# Patient Record
Sex: Female | Born: 1955 | Race: White | Hispanic: No | Smoking: Current every day smoker
Health system: Southern US, Community
[De-identification: ages and names within clinical notes are randomized; demographics above are authoritative.]

## PROBLEM LIST (undated history)

## (undated) DIAGNOSIS — I509 Heart failure, unspecified: Secondary | ICD-10-CM

## (undated) DIAGNOSIS — I2510 Atherosclerotic heart disease of native coronary artery without angina pectoris: Secondary | ICD-10-CM

## (undated) DIAGNOSIS — T82867A Thrombosis of cardiac prosthetic devices, implants and grafts, initial encounter: Secondary | ICD-10-CM

## (undated) DIAGNOSIS — E785 Hyperlipidemia, unspecified: Secondary | ICD-10-CM

## (undated) DIAGNOSIS — I10 Essential (primary) hypertension: Secondary | ICD-10-CM

## (undated) DIAGNOSIS — E119 Type 2 diabetes mellitus without complications: Secondary | ICD-10-CM

## (undated) HISTORY — DX: Hyperlipidemia, unspecified: E78.5

## (undated) HISTORY — DX: Atherosclerotic heart disease of native coronary artery without angina pectoris: I25.10

## (undated) HISTORY — DX: Type 2 diabetes mellitus without complications: E11.9

## (undated) HISTORY — DX: Heart failure, unspecified: I50.9

## (undated) HISTORY — DX: Essential (primary) hypertension: I10

## (undated) HISTORY — PX: CARDIAC CATHETERIZATION: SHX172

---
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
## 2017-01-01 DIAGNOSIS — T82867A Thrombosis of cardiac prosthetic devices, implants and grafts, initial encounter: Secondary | ICD-10-CM

## 2017-01-01 HISTORY — DX: Thrombosis due to cardiac prosthetic devices, implants and grafts, initial encounter: T82.867A

## 2017-08-18 ENCOUNTER — Other Ambulatory Visit (HOSPITAL_COMMUNITY): Payer: Self-pay | Admitting: Family Medicine

## 2017-08-18 DIAGNOSIS — I509 Heart failure, unspecified: Secondary | ICD-10-CM

## 2017-08-24 ENCOUNTER — Encounter (HOSPITAL_COMMUNITY): Payer: Self-pay | Admitting: Radiology

## 2017-08-24 ENCOUNTER — Encounter (HOSPITAL_COMMUNITY)
Admission: RE | Admit: 2017-08-24 | Discharge: 2017-08-24 | Disposition: A | Discharge status: Home / Self Care | Payer: Medicaid Other | Source: Ambulatory Visit | Attending: Family Medicine | Admitting: Family Medicine

## 2017-08-24 DIAGNOSIS — I509 Heart failure, unspecified: Secondary | ICD-10-CM | POA: Diagnosis present

## 2017-08-24 LAB — NM MYOCAR MULTI W/SPECT W/WALL MOTION / EF
CHL CUP RESTING HR STRESS: 52 {beats}/min
CSEPEW: 1 METS
MPHR: 157 {beats}/min
Peak HR: 100 {beats}/min
Percent HR: 63 %

## 2017-08-24 MED ORDER — TECHNETIUM TC 99M TETROFOSMIN IV KIT
10.0000 | PACK | Freq: Once | INTRAVENOUS | Status: AC | PRN
  Administered 2017-08-24: 10 via INTRAVENOUS

## 2017-08-24 MED ORDER — NITROGLYCERIN 0.4 MG SL SUBL
0.4000 mg | SUBLINGUAL_TABLET | SUBLINGUAL | Status: DC | PRN
  Administered 2017-08-24: 0.4 mg via SUBLINGUAL

## 2017-08-24 MED ORDER — REGADENOSON 0.4 MG/5ML IV SOLN
INTRAVENOUS | Status: AC
  Administered 2017-08-24: 0.4 mg
  Filled 2017-08-24: qty 5

## 2017-08-24 MED ORDER — NITROGLYCERIN 0.4 MG SL SUBL
SUBLINGUAL_TABLET | SUBLINGUAL | Status: AC
  Administered 2017-08-24: 0.4 mg
  Filled 2017-08-24: qty 1

## 2017-08-24 MED ORDER — REGADENOSON 0.4 MG/5ML IV SOLN: 0.4000 mg | Freq: Once | INTRAVENOUS | Status: DC

## 2017-08-24 MED ORDER — TECHNETIUM TC 99M TETROFOSMIN IV KIT
30.0000 | PACK | Freq: Once | INTRAVENOUS | Status: AC | PRN
  Administered 2017-08-24: 30 via INTRAVENOUS

## 2017-08-24 MED ORDER — NITROGLYCERIN 0.4 MG SL SUBL
SUBLINGUAL_TABLET | SUBLINGUAL | Status: AC
  Filled 2017-08-24: qty 1

## 2017-08-24 NOTE — Progress Notes (Signed)
Approx 10 minutes after Stress test patient started experiencing jaw pain and headache. Patient placed on monitor and Nitro ordered per [place] ,[place]. ECG being captured. PA present at bedside.

## 2017-11-09 ENCOUNTER — Other Ambulatory Visit (HOSPITAL_COMMUNITY): Payer: Self-pay | Admitting: Neurological Surgery

## 2017-11-09 DIAGNOSIS — G959 Disease of spinal cord, unspecified: Secondary | ICD-10-CM

## 2017-11-23 ENCOUNTER — Encounter (HOSPITAL_COMMUNITY): Payer: Self-pay | Admitting: Physician Assistant

## 2017-11-23 NOTE — Progress Notes (Addendum)
Anesthesia Chart Review:  SAME DAY WORKUP   Case:  [id number] Date/Time:  11/24/17 0945   Procedure:  MRI CERVICAL SPINE WITHOUT CONTRAST (N/A )   Anesthesia type:  General   Pre-op diagnosis:  CERVICAL MYOLOPATHY   Location:  [facility] RADIOLOGY RO[facility] / [facility]   Surgeon:  Radiologist, Medication, MD      DISCUSSION: 62 yo female current smoker for above procedure. Pertinent hx includes HTN, DMII, CAD (reports history of 5 stents).  Pt has essentially no history to review in Epic. She did have a myocardial perfusion scan 08/24/2017 ordered by Dr. Lollie Marrow. Indication for the scan states "History of heart failure. History of MI in October 2018. Palpitations with catheterization and stent. CABG. Multiple risk factors." Scan showed the following:  1. 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 There is a small region of reversibility in the inferolateral wall towards the apex suggesting a small region of peri-infarct ischemia. There is also a small to moderate size region of mild reversibility in the septum towards the base.  2. There is incomplete thickening of the inferior and inferolateral walls correlating with the region of infarct. There is dyskinesis in the septum consistent with previous CABG.  3. Left ventricular ejection fraction 37%  4. Non invasive risk stratification*: High   Due to high risk myocardial perfusion, case discussed with Dr. Chilton Si. She stated the pt needs cardiac clearance prior to anesthesia. I have communicated this to Dr. Marcy Siren scheduler. Case will be cancelled pending clearance.  VS: There were no vitals taken for this visit.  PROVIDERS: Norval Gable, DO is PCP  Lollie Marrow, MD is Cardiologist  LABS: Will need DOS labs  Labs Reviewed - No data to display   IMAGES: N/A   EKG: 08/24/2017: Normal sinus rhythm. Left anterior fascicular block. Moderate voltage criteria for LVH, may be normal variant.  Nonspecific ST and and T wave abnormality. Prolonged QT  CV: Myocardial perfusion scan 08/24/2017: FINDINGS: Perfusion: There is a large defect involving the inferior and inferolateral walls from apex towards the base. Most of this defect is fixed but there is a small region reversibility in the inferolateral wall towards the apex. There is also a small to moderate size region of mild reversibility in the septum towards the base. No other perfusion abnormalities.  Wall Motion: There is dyskinesis in the septum consistent with previous CABG. There is incomplete thickening of the inferior inferolateral walls.  Left Ventricular Ejection Fraction: 37 %  End diastolic volume 102 ml  End systolic volume 64 ml  IMPRESSION: 1. 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 There is a small region of reversibility in the inferolateral wall towards the apex suggesting a small region of peri-infarct ischemia. There is also a small to moderate size region of mild reversibility in the septum towards the base.  2. There is incomplete thickening of the inferior and inferolateral walls correlating with the region of infarct. There is dyskinesis in the septum consistent with previous CABG.  3. Left ventricular ejection fraction 37%  4. Non invasive risk stratification*: High  TTE 08/19/2017: Findings: Study quality: This was stented with adequate study.  Left ventricle: There is mild ventricular left ventricular hypertrophy.  Overall left ventricle systolic function is normal with an EF between 60-65%.  Left atrium: Left atrium is mildly dilated.  Right ventricle: Right ventricle is normal in size and function.  Right atrium: Right atrium normal size and function.  Aortic valve: Aortic valve is trileaflet and is mildly thickened.  There is mild to moderate aortic regurgitation.  Mitral valve: Normal-appearing mitral valve with normal valve  function.  Tricuspid valve: Valve appears structurally normal with normal function.  There is no evidence of pulmonary hypertension.  Pulmonic valve: Pulmonic valve appears structurally normal.  Pericardium: There is no pericardial effusion.  Aorta: Aortic root, ascending aorta and aortic arch are all normal.  IVC: The inferior vena cava is normal. Conclusions: 1.  There is mild concentric left ventricular hypertrophy. 2.  Left atrium is moderately dilated. 3.  Aortic valve is trileaflet and is mildly thickened. 4.  There is mild to moderate aortic regurgitation. 5.  There is no evidence of bone hypertension. 6.  Other than LAE normal cardiac chamber sizes and function; no pericardial effusion or intracardiac mass: No intracardiac shunts by 2-dimensional and color flow imaging.  Normal thoracic aorta and aortic arch.  Past Medical History:  Diagnosis Date  . CAD (coronary artery disease)    5 stents in the past, 4 in [facility], 1 [place] [facility]. Last cath 01/2017  . DM II (diabetes mellitus, type II), controlled (HCC)   . Hyperlipidemia LDL goal <70   . Hypertension     Past Surgical History:  Procedure Laterality Date  . CARDIAC CATHETERIZATION     stents x 5    MEDICATIONS: No current facility-administered medications for this encounter.    . Aspirin-Acetaminophen-Caffeine (GOODY HEADACHE PO)  . citalopram (CELEXA) 20 MG tablet  . Ibuprofen-diphenhydrAMINE HCl (IBUPROFEN PM) 200-25 MG CAPS  . levothyroxine (SYNTHROID, LEVOTHROID) 88 MCG tablet  . nitroGLYCERIN (NITROSTAT) 0.4 MG SL tablet  . pantoprazole (PROTONIX) 40 MG tablet  . pregabalin (LYRICA) 150 MG capsule  . topiramate (TOPAMAX) 25 MG tablet     [facility] Short Stay Center/Anesthesiology Phone [phone] 11/23/2017 4:15 PM

## 2017-11-24 ENCOUNTER — Encounter (HOSPITAL_COMMUNITY): Payer: Self-pay

## 2017-11-24 ENCOUNTER — Encounter (HOSPITAL_COMMUNITY)
Admission: RE | Disposition: A | Discharge status: Home / Self Care | Payer: Self-pay | Source: Ambulatory Visit | Attending: Neurological Surgery

## 2017-11-24 ENCOUNTER — Ambulatory Visit (HOSPITAL_COMMUNITY)
Admission: RE | Admit: 2017-11-24 | Discharge: 2017-11-24 | Disposition: A | Discharge status: Home / Self Care | Payer: Medicaid Other | Source: Ambulatory Visit | Attending: Neurological Surgery | Admitting: Neurological Surgery

## 2017-11-24 ENCOUNTER — Ambulatory Visit (HOSPITAL_COMMUNITY): Payer: Medicaid Other

## 2017-11-24 SURGERY — MRI WITH ANESTHESIA
Anesthesia: General

## 2018-05-02 ENCOUNTER — Observation Stay (HOSPITAL_COMMUNITY)
Admission: EM | Admit: 2018-05-02 | Discharge: 2018-05-04 | Disposition: A | Discharge status: Home / Self Care | Payer: Medicaid Other | Attending: Family Medicine | Admitting: Family Medicine

## 2018-05-02 ENCOUNTER — Emergency Department (HOSPITAL_COMMUNITY): Payer: Medicaid Other

## 2018-05-02 ENCOUNTER — Other Ambulatory Visit: Payer: Self-pay

## 2018-05-02 ENCOUNTER — Encounter (HOSPITAL_COMMUNITY): Payer: Self-pay | Admitting: Emergency Medicine

## 2018-05-02 DIAGNOSIS — N183 Chronic kidney disease, stage 3 (moderate): Secondary | ICD-10-CM | POA: Diagnosis not present

## 2018-05-02 DIAGNOSIS — E1122 Type 2 diabetes mellitus with diabetic chronic kidney disease: Secondary | ICD-10-CM | POA: Diagnosis not present

## 2018-05-02 DIAGNOSIS — Z955 Presence of coronary angioplasty implant and graft: Secondary | ICD-10-CM | POA: Diagnosis not present

## 2018-05-02 DIAGNOSIS — Z7982 Long term (current) use of aspirin: Secondary | ICD-10-CM | POA: Insufficient documentation

## 2018-05-02 DIAGNOSIS — F1290 Cannabis use, unspecified, uncomplicated: Secondary | ICD-10-CM | POA: Insufficient documentation

## 2018-05-02 DIAGNOSIS — R0789 Other chest pain: Secondary | ICD-10-CM | POA: Diagnosis not present

## 2018-05-02 DIAGNOSIS — Z800 Family history of malignant neoplasm of digestive organs: Secondary | ICD-10-CM | POA: Insufficient documentation

## 2018-05-02 DIAGNOSIS — M6281 Muscle weakness (generalized): Secondary | ICD-10-CM | POA: Insufficient documentation

## 2018-05-02 DIAGNOSIS — Z791 Long term (current) use of non-steroidal anti-inflammatories (NSAID): Secondary | ICD-10-CM | POA: Insufficient documentation

## 2018-05-02 DIAGNOSIS — R2689 Other abnormalities of gait and mobility: Secondary | ICD-10-CM | POA: Insufficient documentation

## 2018-05-02 DIAGNOSIS — F419 Anxiety disorder, unspecified: Secondary | ICD-10-CM | POA: Diagnosis not present

## 2018-05-02 DIAGNOSIS — Z23 Encounter for immunization: Secondary | ICD-10-CM | POA: Diagnosis not present

## 2018-05-02 DIAGNOSIS — Z8249 Family history of ischemic heart disease and other diseases of the circulatory system: Secondary | ICD-10-CM | POA: Insufficient documentation

## 2018-05-02 DIAGNOSIS — G43909 Migraine, unspecified, not intractable, without status migrainosus: Secondary | ICD-10-CM | POA: Diagnosis not present

## 2018-05-02 DIAGNOSIS — E785 Hyperlipidemia, unspecified: Secondary | ICD-10-CM | POA: Insufficient documentation

## 2018-05-02 DIAGNOSIS — R110 Nausea: Secondary | ICD-10-CM | POA: Diagnosis not present

## 2018-05-02 DIAGNOSIS — R269 Unspecified abnormalities of gait and mobility: Secondary | ICD-10-CM | POA: Insufficient documentation

## 2018-05-02 DIAGNOSIS — I2510 Atherosclerotic heart disease of native coronary artery without angina pectoris: Secondary | ICD-10-CM | POA: Insufficient documentation

## 2018-05-02 DIAGNOSIS — R079 Chest pain, unspecified: Secondary | ICD-10-CM | POA: Diagnosis present

## 2018-05-02 DIAGNOSIS — I352 Nonrheumatic aortic (valve) stenosis with insufficiency: Secondary | ICD-10-CM | POA: Diagnosis not present

## 2018-05-02 DIAGNOSIS — M48061 Spinal stenosis, lumbar region without neurogenic claudication: Secondary | ICD-10-CM

## 2018-05-02 DIAGNOSIS — F17210 Nicotine dependence, cigarettes, uncomplicated: Secondary | ICD-10-CM | POA: Insufficient documentation

## 2018-05-02 DIAGNOSIS — R197 Diarrhea, unspecified: Secondary | ICD-10-CM | POA: Insufficient documentation

## 2018-05-02 DIAGNOSIS — G510 Bell's palsy: Secondary | ICD-10-CM | POA: Insufficient documentation

## 2018-05-02 DIAGNOSIS — M4802 Spinal stenosis, cervical region: Secondary | ICD-10-CM | POA: Diagnosis not present

## 2018-05-02 DIAGNOSIS — Z888 Allergy status to other drugs, medicaments and biological substances status: Secondary | ICD-10-CM | POA: Insufficient documentation

## 2018-05-02 DIAGNOSIS — K219 Gastro-esophageal reflux disease without esophagitis: Secondary | ICD-10-CM | POA: Insufficient documentation

## 2018-05-02 DIAGNOSIS — Z885 Allergy status to narcotic agent status: Secondary | ICD-10-CM | POA: Insufficient documentation

## 2018-05-02 DIAGNOSIS — E890 Postprocedural hypothyroidism: Secondary | ICD-10-CM | POA: Diagnosis not present

## 2018-05-02 DIAGNOSIS — R634 Abnormal weight loss: Secondary | ICD-10-CM | POA: Diagnosis not present

## 2018-05-02 DIAGNOSIS — R296 Repeated falls: Secondary | ICD-10-CM | POA: Diagnosis not present

## 2018-05-02 DIAGNOSIS — M25551 Pain in right hip: Secondary | ICD-10-CM | POA: Insufficient documentation

## 2018-05-02 DIAGNOSIS — Z79890 Hormone replacement therapy (postmenopausal): Secondary | ICD-10-CM | POA: Insufficient documentation

## 2018-05-02 DIAGNOSIS — R2681 Unsteadiness on feet: Secondary | ICD-10-CM | POA: Diagnosis not present

## 2018-05-02 DIAGNOSIS — I129 Hypertensive chronic kidney disease with stage 1 through stage 4 chronic kidney disease, or unspecified chronic kidney disease: Secondary | ICD-10-CM | POA: Insufficient documentation

## 2018-05-02 DIAGNOSIS — F329 Major depressive disorder, single episode, unspecified: Secondary | ICD-10-CM | POA: Insufficient documentation

## 2018-05-02 DIAGNOSIS — Z79899 Other long term (current) drug therapy: Secondary | ICD-10-CM | POA: Insufficient documentation

## 2018-05-02 LAB — CBC WITH DIFFERENTIAL/PLATELET
Abs Immature Granulocytes: 0.02 10*3/uL (ref 0.00–0.07)
BASOS ABS: 0.1 10*3/uL (ref 0.0–0.1)
Basophils Relative: 1 %
EOS ABS: 0.1 10*3/uL (ref 0.0–0.5)
EOS PCT: 1 %
HEMATOCRIT: 39.8 % (ref 36.0–46.0)
HEMOGLOBIN: 13.5 g/dL (ref 12.0–15.0)
Immature Granulocytes: 0 %
LYMPHS ABS: 2.3 10*3/uL (ref 0.7–4.0)
LYMPHS PCT: 24 %
MCH: 33.4 pg (ref 26.0–34.0)
MCHC: 33.9 g/dL (ref 30.0–36.0)
MCV: 98.5 fL (ref 80.0–100.0)
MONOS PCT: 5 %
Monocytes Absolute: 0.5 10*3/uL (ref 0.1–1.0)
Neutro Abs: 6.5 10*3/uL (ref 1.7–7.7)
Neutrophils Relative %: 69 %
Platelets: 283 10*3/uL (ref 150–400)
RBC: 4.04 MIL/uL (ref 3.87–5.11)
RDW: 12.8 % (ref 11.5–15.5)
WBC: 9.6 10*3/uL (ref 4.0–10.5)
nRBC: 0 % (ref 0.0–0.2)

## 2018-05-02 LAB — BASIC METABOLIC PANEL
Anion gap: 7 (ref 5–15)
BUN: 10 mg/dL (ref 8–23)
CALCIUM: 8.7 mg/dL — AB (ref 8.9–10.3)
CHLORIDE: 110 mmol/L (ref 98–111)
CO2: 20 mmol/L — AB (ref 22–32)
CREATININE: 1.03 mg/dL — AB (ref 0.44–1.00)
GFR calc Af Amer: >60 mL/min (ref >60)
GFR calc non Af Amer: 57 mL/min — ABNORMAL LOW (ref >60)
GLUCOSE: 101 mg/dL — AB (ref 70–99)
Potassium: 3.5 mmol/L (ref 3.5–5.1)
Sodium: 137 mmol/L (ref 135–145)

## 2018-05-02 LAB — TROPONIN I: Troponin I: <0.03 ng/mL (ref <0.03)

## 2018-05-02 LAB — TSH: TSH: 1.964 u[IU]/mL (ref 0.350–4.500)

## 2018-05-02 LAB — I-STAT TROPONIN, ED: TROPONIN I, POC: 0.01 ng/mL (ref 0.00–0.08)

## 2018-05-02 LAB — PREALBUMIN: Prealbumin: 19.8 mg/dL (ref 18–38)

## 2018-05-02 LAB — CBG MONITORING, ED: Glucose-Capillary: 89 mg/dL (ref 70–99)

## 2018-05-02 LAB — HEMOGLOBIN A1C
Hgb A1c MFr Bld: 5 % (ref 4.8–5.6)
Mean Plasma Glucose: 96.8 mg/dL

## 2018-05-02 LAB — BRAIN NATRIURETIC PEPTIDE: B Natriuretic Peptide: 256.7 pg/mL — ABNORMAL HIGH (ref 0.0–100.0)

## 2018-05-02 MED ORDER — NITROGLYCERIN 0.4 MG SL SUBL: 0.4000 mg | SUBLINGUAL_TABLET | SUBLINGUAL | Status: DC | PRN

## 2018-05-02 MED ORDER — PANTOPRAZOLE SODIUM 40 MG PO TBEC
40.0000 mg | DELAYED_RELEASE_TABLET | Freq: Every day | ORAL | Status: DC
  Administered 2018-05-02 – 2018-05-04 (×3): 40 mg via ORAL
  Filled 2018-05-02 (×3): qty 1

## 2018-05-02 MED ORDER — NICOTINE 14 MG/24HR TD PT24
14.0000 mg | MEDICATED_PATCH | Freq: Every day | TRANSDERMAL | Status: DC
  Administered 2018-05-03: 14 mg via TRANSDERMAL
  Filled 2018-05-02 (×2): qty 1

## 2018-05-02 MED ORDER — ONDANSETRON HCL 4 MG/2ML IJ SOLN
4.0000 mg | Freq: Once | INTRAMUSCULAR | Status: AC
  Administered 2018-05-02: 4 mg via INTRAVENOUS
  Filled 2018-05-02: qty 2

## 2018-05-02 MED ORDER — ROSUVASTATIN CALCIUM 20 MG PO TABS
20.0000 mg | ORAL_TABLET | Freq: Every day | ORAL | Status: DC
  Administered 2018-05-03: 20 mg via ORAL
  Filled 2018-05-02: qty 1

## 2018-05-02 MED ORDER — HEPARIN SODIUM (PORCINE) 5000 UNIT/ML IJ SOLN
5000.0000 [IU] | Freq: Three times a day (TID) | INTRAMUSCULAR | Status: DC
  Administered 2018-05-02 – 2018-05-04 (×5): 5000 [IU] via SUBCUTANEOUS
  Filled 2018-05-02 (×5): qty 1

## 2018-05-02 MED ORDER — ACETAMINOPHEN 325 MG PO TABS
650.0000 mg | ORAL_TABLET | Freq: Four times a day (QID) | ORAL | Status: DC | PRN
  Administered 2018-05-03 – 2018-05-04 (×4): 650 mg via ORAL
  Filled 2018-05-02 (×5): qty 2

## 2018-05-02 MED ORDER — MELATONIN 3 MG PO TABS
9.0000 mg | ORAL_TABLET | Freq: Every day | ORAL | Status: DC
  Administered 2018-05-02 – 2018-05-03 (×2): 9 mg via ORAL
  Filled 2018-05-02 (×2): qty 3

## 2018-05-02 MED ORDER — ONDANSETRON HCL 4 MG/2ML IJ SOLN
4.0000 mg | Freq: Four times a day (QID) | INTRAMUSCULAR | Status: DC | PRN
  Administered 2018-05-03 – 2018-05-04 (×2): 4 mg via INTRAVENOUS
  Filled 2018-05-02 (×2): qty 2

## 2018-05-02 MED ORDER — ASPIRIN EC 81 MG PO TBEC
81.0000 mg | DELAYED_RELEASE_TABLET | Freq: Every day | ORAL | Status: DC
  Administered 2018-05-03 – 2018-05-04 (×2): 81 mg via ORAL
  Filled 2018-05-02 (×2): qty 1

## 2018-05-02 NOTE — H&P (Addendum)
Family Medicine Teaching [facility] Admission History and Physical Service Pager: [phone]  Patient name: Kelly [name] Medical record number: [id number] Date of birth: 1955-07-18 Age: 63 y.o. Gender: female  Primary Care Provider: Norval Gable, DO Consultants: none Code Status: full  Chief Complaint: chest pain  Assessment and Plan: Kelly [name] is a 63 y.o. female presenting with chest pain. PMH is significant for CAD s/p 5 stents, Type 2 Diabetes, HTN, HLD, depression, GERD, tobacco use, MJ use, hypothyroidism s/p thyroidectomy, cervical stenosis.  Chest Pain concerning for ACS, h/o CAD s/p 5 stents Last cath 01/2017. Current tobacco use.  Her current symptoms include 2 weeks of nausea, 1 week of chest pressure and several days with sharp middle chest pain, symptoms appear to be alleviated with nitroglycerin.  On presentation to the ED, her vitals remarkable for tachypnea up to 26.  Physical exam was generally unremarkable.  Admission labs revealed creatinine of 1.03 (unclear baseline), troponin was 0.01.  Chest x-ray had no notable findings and EKG showed no evidence of ST elevation, but did have slight lateral Q waves which may have been present prior, difficult to tell from previous EKG.  She was admitted to rule out acute coronary syndrome.  At this time it seems prudent to rule out cardiac etiologies for her current symptoms of though there seem to be multiple medical issues contributing to her current state.  As result of 6 months of unmedicated disease processes she may also be experiencing symptoms from irritable bowel syndrome, gastroparesis secondary to poorly controlled diabetes, slowed gastric emptying secondary to hypothyroidism, and GERD.  An element of anxiety, depression may also be affecting her current symptoms.  The goal of this hospitalization will be to rule out cardiac causes of her current symptoms and help establish her with a primary care physician to get  appropriate ongoing medical care for her chronic diseases. -Admit inpatient, attending Dr. Lum Babe -Cardiac monitors -Trend troponins -A.m. EKG -Nitroglycerin as needed -Follow-up risk stratification labs (A1c, TSH, lipid panel) -Consider cardiac consult if new concerning symptoms arise -Pantoprazole -Tylenol PRN  Type 2 Diabetes  She reports having taken no medication in the past 6 months.  Her physical exam is notable for an amputated left first toe indicating poor glycemic control. Glucose on admission 103. Suspect relatively "well" controlled DM2 given h/o poor appetite. -F/U A1c -Consider glucose lowering therapy following A1c result (Jardiance?)   HTN SBP up to 157.  She reports no history of hypertension, is not familiar with her most recent medication. -Consider ACE/ARB, beta-blocker -So ultimately be aided by finding her a PCP  HLD No lipid panel is evident in her chart per review.  Given her history, she would benefit from a lipid-lowering agent.  She reports being on a statin in the past but discontinuing it due to GI side effects. -Start rosuvastatin 20 and monitor for side effects  Hypothyroidism Ultimately, will allow PCP to address this issue. -TSH pending  CKD stage III/possible AKI Creatinine clearance admission is 49.7.  This is likely secondary to poorly controlled diabetes and hypertension.  AKI is possible due to lack of previous information the history does not sound suspicious for prerenal causes of AKI.  BUN/creatinine ratio is less than 20 indicating his not likely secondary to prerenal issue. -We will continue to monitor -Avoid nephrotoxic agents, adjust medication accordingly  Nicotine use disorder She reports smoking 1/2 pack pf Phillie's per day. -Nicotine patch 14 mg - tobacco cessation counseling  Weight loss, unclear etiology She  reports a 100 pound weight loss in the past year without intention to lose weight.  She also notes a decreased  appetite.  This will, ultimately, be an issue for outpatient follow-up but is certainly concerning for a cancerous origin especially given ongoing diarrhea, +FH (dad had colon cancer in 60s), decreased appetite.  She reports that her last colonoscopy was over 10 years ago.  She also reports a significant smoking history since the age of 43.  Certainly age-appropriate cancer screening would be warranted as an outpatient. -Consider low-dose chest CT -Outpatient follow-up for colonoscopy  Headaches with left facial paralysis She reports multiple recent episodes of headaches accompanied by left facial paralysis that ultimately entirely resolved.  She has not had an episode today and endorses no focal deficits on exam today. States her son sometimes notices slurring of her speech but this also completely resolves with headache. This is concerning for possible TIA, especially given known vasculopathy.  Without direct evidence, no additional work-up is warranted. -We will continue to monitor throughout hospitalization  Poorly controlled chronic diseases  This patient clearly has suffered from her lack of medical care after leaving her primary care physician. -Consult to case management to assist finding a primary care physician.  FEN/GI: General diet Prophylaxis: Heparin  Disposition: Possible DC 3/2 pending ACS rule out  History of Present Illness:  Kelly [name] is a 63 y.o. female presenting with chest pain.  She has a previous medical history significant for CAD s/p 5 stents, hypertension, T2 DM, HLD, MDD, GERD, tobacco use, hypothyroidism s/p thyroidectomy.  She reports that her symptoms began 2 to 3 weeks ago with persistent nausea.  She reports that this occurs occasionally due to her irritable bowel syndrome and that she never vomits because she has had surgery for her hiatal hernia which prevents vomiting.  In the past week, she is noted the sensation of a ton of bricks pressing on her chest.  She  has not noticed any particular activity that exacerbates this feeling.  She notes that she does feel significant anxiety with this chest pressure and recalls several instances when she felt this pressure while lying down and could feel her heart rate slowing and fear to that her heart would stop and she would die.  This chest pressure is sometimes accompanied by a sharp or pinching sensation at her left midclavicular line around her mid chest.  She sometimes experiences palpitations when she moves from a seated to standing position and with any mild exertion.  She also experiences shortness of breath with mild exertions. Does not have exacerbation of chest pain with exertion. States timing of chest pain is random.  She was brought to the hospital via EMS who administered 4 baby aspirin and nitroglycerin was appeared to significantly alleviate her chest pressure.  The background of this current episode is notable for many unmedicated and uncontrolled medical conditions.  She has not taken any medications or seen any physician in about 6 months.  She reports that she does not trust her physician (previously received significant care at [facility] in [facility] for years) and has run out of medications.  On review of systems, she noted occasional headaches associated with left-sided facial drooping that ultimately resolve.  She also noted a 100 pound unintentional weight loss in the past year accompanied by decreased appetite.  She reports she has had very little to eat in the past week.  In the ED, chest x-ray showed no acute cardiopulmonary findings and  EKG showed normal sinus rhythm without ST elevations but notable for lateral Q waves.  She was admitted to the inpatient service for ACS rule out.  Review Of Systems: Per HPI with the following additions:   Review of Systems  Constitutional: Positive for weight loss (100lb reported unintentional weight loss in past year). Negative for chills,  diaphoresis and fever.  HENT: Negative for congestion.   Respiratory: Positive for cough and shortness of breath.   Cardiovascular: Positive for chest pain and palpitations.  Gastrointestinal: Positive for abdominal pain, diarrhea and nausea. Negative for blood in stool, constipation and vomiting.  Genitourinary: Negative for dysuria and urgency.  Musculoskeletal: Positive for myalgias.  Skin: Negative for rash.  Neurological: Positive for tingling (chronic, no change), tremors (chronic, no change), focal weakness (chronic, no change) and headaches (chronic, no change). Negative for speech change.  Psychiatric/Behavioral: Positive for substance abuse (marijuana currently. 1/2 pack of Philly's/day).    Patient Active Problem List   Diagnosis Date Noted  . Chest pain 05/02/2018    Past Medical History: Past Medical History:  Diagnosis Date  . CAD (coronary artery disease)    5 stents in the past, 4 in [facility], 1 [place] [facility]. Last cath 01/2017  . DM II (diabetes mellitus, type II), controlled (HCC)   . Hyperlipidemia LDL goal <70   . Hypertension    Past Surgical History: Past Surgical History:  Procedure Laterality Date  . CARDIAC CATHETERIZATION     stents x 5   Social History: Social History   Tobacco Use  . Smoking status: Current Every Day Smoker  . Smokeless tobacco: Never Used  Substance Use Topics  . 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 Drug use: Yes    Types: Marijuana   Family History: Family History  Problem Relation Age of Onset  . Heart disease Mother        stents x 8 in her life. Passed away in 17-Jul-2014  . Alzheimer's disease Mother   . Alzheimer's disease Father     Allergies and Medications: Allergies  Allergen Reactions  . Morphine And Related Nausea And Vomiting    Severe nausea and dry heaves  . Other Other (See Comments)    "Many antibiotics cause severe yeast infections" (can  tolerate Z-Paks)   No current facility-administered medications on file prior to encounter.    Current Outpatient Medications on File Prior to Encounter  Medication Sig Dispense Refill  . Aspirin-Acetaminophen-Caffeine (GOODY HEADACHE PO) Take 1 packet by mouth as needed (for headaches or pain).     Marland Kitchen diphenhydramine-acetaminophen (TYLENOL PM) 25-500 MG TABS tablet Take 1 tablet by mouth 4 (four) times daily.    . Melatonin 10 MG TABS Take 10 mg by mouth at bedtime.    . citalopram (CELEXA) 20 MG tablet Take 20 mg by mouth daily.    . Ibuprofen-diphenhydrAMINE HCl (IBUPROFEN PM) 200-25 MG CAPS Take 1-2 tablets by mouth at bedtime.    Marland Kitchen levothyroxine (SYNTHROID, LEVOTHROID) 88 MCG tablet Take 88 mcg by mouth daily before breakfast.  0  . nitroGLYCERIN (NITROSTAT) 0.4 MG SL tablet Place 0.4 mg under the tongue every 5 (five) minutes as needed for chest pain.    . pantoprazole (PROTONIX) 40 MG tablet Take 40 mg by mouth daily.    . pregabalin (LYRICA) 150 MG capsule Take 150 mg by mouth 2 (two) times daily.  0  . topiramate (  TOPAMAX) 25 MG tablet Take 25 mg by mouth at bedtime.  1    Objective: BP (!) 130/46 (BP Location: Right Arm)   Pulse 63   Temp 98.4 F (36.9 C) (Oral)   Resp 18   Ht  (1.651 m)   Wt 59 kg   SpO2 98%   BMI 21.63 kg/m   Physical Exam Constitutional:      Appearance: She is normal weight.     Comments: Appears older than stated age.  Hair dyed purple.  Fair expressive and talkative.  HENT:     Nose: Nose normal. No congestion.     Mouth/Throat:     Mouth: Mucous membranes are moist.  Eyes:     Extraocular Movements: Extraocular movements intact.     Conjunctiva/sclera: Conjunctivae normal.  Neck:     Musculoskeletal: Normal range of motion.  Cardiovascular:     Rate and Rhythm: Normal rate and regular rhythm.     Pulses: Normal pulses.     Heart sounds: Normal heart sounds. No murmur.  Pulmonary:     Effort: Pulmonary effort is normal.     Breath  sounds: Rales (in upper fields bilaterally) present.  Abdominal:     General: Abdomen is flat. Bowel sounds are normal.     Palpations: Abdomen is soft.  Musculoskeletal:     Comments: Left first toe amputation.  Skin:    General: Skin is dry.     Coloration: Skin is pale.     Comments: Markedly cool lower extremities.  Neurological:     Mental Status: She is alert. Mental status is at baseline.     Comments: 5/5 strength in upper extremities.  4/5 strength in lower extremities.  Notable flexion of left wrist (chronic).    Labs and Imaging: CBC BMET  Recent Labs  Lab 05/02/18 1543  WBC 9.6  HGB 13.5  HCT 39.8  PLT 283   Recent Labs  Lab 05/02/18 1543  NA 137  K 3.5  CL 110  CO2 20*  BUN 10  CREATININE 1.03*  GLUCOSE 101*  CALCIUM 8.7*     Dg Chest 2 View  Result Date: 05/02/2018 CLINICAL DATA:  Chest pain EXAM: CHEST - 2 VIEW COMPARISON:  04/15/2017 FINDINGS: The heart size and mediastinal contours are within normal limits. Both lungs are clear. The visualized skeletal structures are unremarkable. IMPRESSION: No active cardiopulmonary disease. Electronically Signed   By: Elige Ko   On: 05/02/2018 16:34   Mirian Mo, MD 05/02/2018, 8:33 PM PGY-1, [facility] FPTS Intern pager: [phone], text pages welcome  FPTS Upper-Level Resident Addendum   I have independently interviewed and examined the patient. I have discussed the above with the original author and agree with their documentation. My edits for correction/addition/clarification are in green. Please see also any attending notes.    Ellwood Dense, DO PGY-2, [facility] 05/02/2018 9:38 PM  FPTS Service pager: [phone] (text pages welcome through [facility])

## 2018-05-02 NOTE — ED Provider Notes (Signed)
[facility] EMERGENCY DEPARTMENT Provider Note   CSN: [id number] Arrival date & time: 05/02/18  1532    History   Chief Complaint No chief complaint on file.   HPI Kelly [name] is a 63 y.o. female.     Patient is a 63 year old female who presents with chest pain.  She has a history of diabetes, hypertension, hyperlipidemia and coronary artery disease status post 5 stents in the past.  She has been having intermittent chest pain for the last week which she describes as a pressure feeling to her left chest under her left breast.  It is nonradiating.  She has associated shortness of breath.  She has had some nausea but that is been going on for about a month and she is not sure if it is related.  She denies any abdominal pain currently although she has had some pain which she attributes to her irritable bowel syndrome.  No vomiting.  No fevers.  No leg swelling.  She was given 4 baby aspirin and nitroglycerin per EMS and her chest pain has improved.  She still has about a 4 out of 10 pain.  She states that she has been off of all her medicines for about the last 6 months.  She states that she does not trust her doctors and has not made an appointment to follow-up.  She does smoke cigarettes.     Past Medical History:  Diagnosis Date  . CAD (coronary artery disease)    5 stents in the past, 4 in [employer], 1 [place] [facility]. Last cath 01/2017  . DM II (diabetes mellitus, type II), controlled (HCC)   . Hyperlipidemia LDL goal <70   . Hypertension     There are no active problems to display for this patient.   Past Surgical History:  Procedure Laterality Date  . CARDIAC CATHETERIZATION     stents x 5     OB History   No obstetric history on file.      Home Medications    Prior to Admission medications   Medication Sig Start Date End Date Taking? Authorizing Provider  Aspirin-Acetaminophen-Caffeine (GOODY HEADACHE PO) Take 1 packet by mouth as  needed (for headaches or pain).    Yes [provider]  diphenhydramine-acetaminophen (TYLENOL PM) 25-500 MG TABS tablet Take 1 tablet by mouth 4 (four) times daily.   Yes [provider]  Melatonin 10 MG TABS Take 10 mg by mouth at bedtime.   Yes [provider]  citalopram (CELEXA) 20 MG tablet Take 20 mg by mouth daily.    [provider]  Ibuprofen-diphenhydrAMINE HCl (IBUPROFEN PM) 200-25 MG CAPS Take 1-2 tablets by mouth at bedtime.    [provider]  levothyroxine (SYNTHROID, LEVOTHROID) 88 MCG tablet Take 88 mcg by mouth daily before breakfast. 08/19/17   [provider]  nitroGLYCERIN (NITROSTAT) 0.4 MG SL tablet Place 0.4 mg under the tongue every 5 (five) minutes as needed for chest pain.    [provider]  pantoprazole (PROTONIX) 40 MG tablet Take 40 mg by mouth daily.    [provider]  pregabalin (LYRICA) 150 MG capsule Take 150 mg by mouth 2 (two) times daily. 11/12/17   [provider]  topiramate (TOPAMAX) 25 MG tablet Take 25 mg by mouth at bedtime. 08/19/17   [provider]    Family History Family History  Problem Relation Age of Onset  . Heart disease Mother  stents x 8 in her life. Passed away in 07/11/2014  . Alzheimer's disease Mother   . Alzheimer's disease Father     Social History Social History   Tobacco Use  . Smoking status: Current Every Day Smoker  . Smokeless tobacco: Never Used  Substance Use Topics  . 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 Drug use: Yes    Types: Marijuana     Allergies   Morphine and related and Other   Review of Systems Review of Systems  Constitutional: Positive for fatigue. Negative for chills, diaphoresis and fever.  HENT: Negative for congestion, rhinorrhea and sneezing.   Eyes: Negative.   Respiratory: Positive for shortness of breath. Negative for cough and chest tightness.     Cardiovascular: Positive for chest pain. Negative for leg swelling.  Gastrointestinal: Positive for nausea. Negative for abdominal pain, blood in stool, diarrhea and vomiting.  Genitourinary: Negative for difficulty urinating, flank pain, frequency and hematuria.  Musculoskeletal: Negative for arthralgias and back pain.  Skin: Negative for rash.  Neurological: Negative for dizziness, speech difficulty, weakness, numbness and headaches.     Physical Exam Updated Vital Signs BP (!) 153/71   Pulse 78   Temp 99 F (37.2 C) (Oral)   Resp (!) 26   Ht 5\' 5"  (1.651 m)   Wt 59 kg   SpO2 97%   BMI 21.63 kg/m   Physical Exam Constitutional:      Appearance: She is well-developed.  HENT:     Head: Normocephalic and atraumatic.  Eyes:     Pupils: Pupils are equal, round, and reactive to light.  Neck:     Musculoskeletal: Normal range of motion and neck supple.  Cardiovascular:     Rate and Rhythm: Normal rate and regular rhythm.     Heart sounds: Normal heart sounds.  Pulmonary:     Effort: Pulmonary effort is normal. No respiratory distress.     Breath sounds: Normal breath sounds. No wheezing or rales.  Chest:     Chest wall: No tenderness.  Abdominal:     General: Bowel sounds are normal.     Palpations: Abdomen is soft.     Tenderness: There is no abdominal tenderness. There is no guarding or rebound.  Musculoskeletal: Normal range of motion.        General: No swelling or tenderness.  Lymphadenopathy:     Cervical: No cervical adenopathy.  Skin:    General: Skin is warm and dry.     Findings: No rash.  Neurological:     Mental Status: She is alert and oriented to person, place, and time.      ED Treatments / Results  Labs (all labs ordered are listed, but only abnormal results are displayed) Labs Reviewed  BASIC METABOLIC PANEL - Abnormal; Notable for the following components:      Result Value   CO2 20 (*)    Glucose, Bld 101 (*)    Creatinine, Ser 1.03 (*)     Calcium 8.7 (*)    GFR calc non Af Amer 57 (*)    All other components within normal limits  CBC WITH DIFFERENTIAL/PLATELET  I-STAT TROPONIN, ED    EKG EKG Interpretation  Date/Time:  Sunday May 02 2018 15:36:49 EST Ventricular Rate:  83 PR Interval:    QRS Duration: 106 QT Interval:  389 QTC Calculation: 458 R Axis:   -44 Text Interpretation:  Sinus  rhythm LVH with secondary repolarization abnormality Anterior Q waves, possibly due to LVH No old tracing to compare Confirmed by Rolan Bucco [phone]) on 05/02/2018 3:52:34 PM   Radiology Dg Chest 2 View  Result Date: 05/02/2018 CLINICAL DATA:  Chest pain EXAM: CHEST - 2 VIEW COMPARISON:  04/15/2017 FINDINGS: The heart size and mediastinal contours are within normal limits. Both lungs are clear. The visualized skeletal structures are unremarkable. IMPRESSION: No active cardiopulmonary disease. Electronically Signed   By: Elige Ko   On: 05/02/2018 16:34    Procedures Procedures (including critical care time)  Medications Ordered in ED Medications  nitroGLYCERIN (NITROSTAT) SL tablet 0.4 mg (has no administration in time range)  ondansetron (ZOFRAN) injection 4 mg (4 mg Intravenous Given 05/02/18 1626)     Initial Impression / Assessment and Plan / ED Course  I have reviewed the triage vital signs and the nursing notes.  Pertinent labs & imaging results that were available during my care of the patient were reviewed by me and considered in my medical decision making (see chart for details).        Patient is a 63 year old female who presents with chest pain.  She is currently pain-free after nitroglycerin.  She also got baby aspirin by EMS.  Her EKG does not show any acute ischemic changes.  Her troponin is negative.  Her other labs are non-concerning.  Her chest x-ray is clear without evidence of pneumonia or pneumothorax.  Given her elevated heart score, I will admit her for further evaluation.  I spoke with the family  medicine resident who is on for unassigned patients who will admit the patient.  Final Clinical Impressions(s) / ED Diagnoses   Final diagnoses:  Chest pain, unspecified type    ED Discharge Orders    None       Rolan Bucco, MD 05/02/18 1710

## 2018-05-02 NOTE — ED Triage Notes (Signed)
Per EMS: pt from home with c/o chest pressure (8/10) x1 week, nauseous x1 month.  Pt was administered 1 nitro, 324 ASA PTA by EMS.  Pt reports pain 4/10 currently.  Pt reports being off medications x6 months.

## 2018-05-02 NOTE — Progress Notes (Signed)
Received report from ED, patient going to 5c-04

## 2018-05-02 NOTE — ED Notes (Signed)
Admitting team at bedside.

## 2018-05-03 ENCOUNTER — Observation Stay (HOSPITAL_BASED_OUTPATIENT_CLINIC_OR_DEPARTMENT_OTHER): Payer: Medicaid Other

## 2018-05-03 DIAGNOSIS — E039 Hypothyroidism, unspecified: Secondary | ICD-10-CM | POA: Diagnosis not present

## 2018-05-03 DIAGNOSIS — R079 Chest pain, unspecified: Secondary | ICD-10-CM

## 2018-05-03 DIAGNOSIS — R0789 Other chest pain: Secondary | ICD-10-CM | POA: Diagnosis not present

## 2018-05-03 DIAGNOSIS — Z23 Encounter for immunization: Secondary | ICD-10-CM | POA: Diagnosis not present

## 2018-05-03 DIAGNOSIS — I351 Nonrheumatic aortic (valve) insufficiency: Secondary | ICD-10-CM

## 2018-05-03 DIAGNOSIS — M48061 Spinal stenosis, lumbar region without neurogenic claudication: Secondary | ICD-10-CM | POA: Diagnosis not present

## 2018-05-03 DIAGNOSIS — I2510 Atherosclerotic heart disease of native coronary artery without angina pectoris: Secondary | ICD-10-CM | POA: Diagnosis not present

## 2018-05-03 DIAGNOSIS — Z955 Presence of coronary angioplasty implant and graft: Secondary | ICD-10-CM | POA: Diagnosis not present

## 2018-05-03 LAB — CBC
HEMATOCRIT: 35.4 % — AB (ref 36.0–46.0)
Hemoglobin: 11.8 g/dL — ABNORMAL LOW (ref 12.0–15.0)
MCH: 33.1 pg (ref 26.0–34.0)
MCHC: 33.3 g/dL (ref 30.0–36.0)
MCV: 99.4 fL (ref 80.0–100.0)
Platelets: 254 10*3/uL (ref 150–400)
RBC: 3.56 MIL/uL — ABNORMAL LOW (ref 3.87–5.11)
RDW: 12.9 % (ref 11.5–15.5)
WBC: 7.3 10*3/uL (ref 4.0–10.5)
nRBC: 0 % (ref 0.0–0.2)

## 2018-05-03 LAB — LIPID PANEL
CHOLESTEROL: 154 mg/dL (ref 0–200)
HDL: 28 mg/dL — ABNORMAL LOW (ref >40)
LDL Cholesterol: 94 mg/dL (ref 0–99)
Total CHOL/HDL Ratio: 5.5 RATIO
Triglycerides: 161 mg/dL — ABNORMAL HIGH (ref <150)
VLDL: 32 mg/dL (ref 0–40)

## 2018-05-03 LAB — BASIC METABOLIC PANEL
Anion gap: 6 (ref 5–15)
BUN: 13 mg/dL (ref 8–23)
CO2: 24 mmol/L (ref 22–32)
Calcium: 8 mg/dL — ABNORMAL LOW (ref 8.9–10.3)
Chloride: 109 mmol/L (ref 98–111)
Creatinine, Ser: 1.15 mg/dL — ABNORMAL HIGH (ref 0.44–1.00)
GFR calc Af Amer: 58 mL/min — ABNORMAL LOW (ref >60)
GFR calc non Af Amer: 50 mL/min — ABNORMAL LOW (ref >60)
Glucose, Bld: 124 mg/dL — ABNORMAL HIGH (ref 70–99)
Potassium: 4 mmol/L (ref 3.5–5.1)
Sodium: 139 mmol/L (ref 135–145)

## 2018-05-03 LAB — TROPONIN I: Troponin I: <0.03 ng/mL (ref <0.03)

## 2018-05-03 LAB — ECHOCARDIOGRAM COMPLETE
Height: 65 in
Weight: 2080 oz

## 2018-05-03 LAB — HIV ANTIBODY (ROUTINE TESTING W REFLEX): HIV Screen 4th Generation wRfx: NONREACTIVE

## 2018-05-03 MED ORDER — LIDOCAINE 5 % EX PTCH
1.0000 | MEDICATED_PATCH | CUTANEOUS | Status: DC
  Administered 2018-05-03: 1 via TRANSDERMAL
  Filled 2018-05-03: qty 1

## 2018-05-03 MED ORDER — INFLUENZA VAC SPLIT QUAD 0.5 ML IM SUSY
0.5000 mL | PREFILLED_SYRINGE | INTRAMUSCULAR | Status: AC
  Administered 2018-05-04: 0.5 mL via INTRAMUSCULAR
  Filled 2018-05-03: qty 0.5

## 2018-05-03 NOTE — Progress Notes (Signed)
    Durable Medical Equipment  (From admission, onward)         Start     Ordered   05/03/18 1716  For home use only DME standard manual wheelchair with seat cushion  Once    Comments:  Patient suffers from Lumbar Stenosis which impairs their ability to perform daily activities like bathing, dressing, feeding and grooming in the home.  A cane will not resolve  issue with performing activities of daily living. A wheelchair will allow patient to safely perform daily activities. Patient can safely propel the wheelchair in the home or has a caregiver who can provide assistance.  Accessories: elevating leg rests (ELRs), wheel locks, extensions and anti-tippers.   05/03/18 1715   05/03/18 0000  For home use only DME standard manual wheelchair with seat cushion    Comments:  Patient suffers from lumbar stenosis which impairs their ability to perform daily activities like bathing, dressing and grooming in the home.  A cane will not resolve issue with performing activities of daily living. A wheelchair will allow patient to safely perform daily activities. Patient can safely propel the wheelchair in the home or has a caregiver who can provide assistance.  Accessories: elevating leg rests (ELRs), wheel locks, extensions and anti-tippers.   05/03/18 1712        Luisa Dago, OT/L   Acute OT Clinical Specialist Acute Rehabilitation Services Pager [phone] Office [phone]

## 2018-05-03 NOTE — Progress Notes (Signed)
Family Medicine Teaching Service Daily Progress Note Intern Pager: [phone]  Patient name: Kelly [name] Medical record number: [id number] Date of birth: 11-Dec-1955 Age: 63 y.o. Gender: female  Primary Care Provider: Norval Gable, DO Consultants: none Code Status: full  Pt Overview and Major Events to Date:  3/1-admission for ACS rule out  Assessment and Plan: Kelly [name] is a 63 y.o. female presenting with chest pain. PMH is significant for CAD s/p 5 stents, Type 2 Diabetes, HTN, HLD, depression, GERD, tobacco use, MJ use, hypothyroidism s/p thyroidectomy, cervical stenosis.  Chest Pain concerning for ACS, h/o CAD s/p 5 stents Vitals remain unremarkable overnight.  Troponins stable at less than 0.03.  BNP elevated to 256 (no prior measurements).  No evidence of fluid overload on exam.  She reports no significant changes in her symptoms overnight.  Potentially mildly reduced chest pressure with mildly increased stomach pain/nausea.  With the results we have at this time, we have the reason to suspect cardiac etiology to her symptoms.  It is likely due to her other chronic diseases. -Cardiac monitors -Pantoprazole -Tylenol PRN  Gait abnormalities Physical therapy consulted, SNF recommended ordered 24-hour supervision/assistance. -Patient now stable to seek out SNF placement  Type 2 Diabetes  A1c 5.0 on admission. -No intervention at this time   HTN Stable overnight -No intervention at this time  HLD Lipid panel significant for total cholesterol 154, LDL 94. -Continue rosuvastatin 20  History of hypothyroidism TSH 1.9 -No intervention warranted at this time  CKD stage III -We will continue to monitor -Avoid nephrotoxic agents, adjust medication accordingly  Nicotine use disorder She reports smoking 1/2 pack pf [employer] per day. -Nicotine patch 14 mg  Weight loss, unclear etiology -Consider low-dose chest CT -Outpatient follow-up for colonoscopy  Headaches  with left facial paralysis No evidence at this time -We will continue to monitor throughout hospitalization  Poorly controlled chronic diseases  This patient clearly has suffered from her lack of medical care after leaving her primary care physician. -Consult to case management to assist finding a primary care physician.  FEN/GI: General diet Prophylaxis: Heparin   Disposition: Patient now medically stable and ready for SNF placement  Subjective:  No acute events overnight.  Ms. Gilkeson reports mild improvement in her chest tightness in addition to some increased nausea overnight.  No new symptoms.  Objective: Temp:  [97.8 F (36.6 C)-99 F (37.2 C)] 98 F (36.7 C) (03/02 0607) Pulse Rate:  [51-97] 51 (03/02 0607) Resp:  [15-26] 16 (03/02 0607) BP: (113-159)/(42-97) 113/42 (03/02 0607) SpO2:  [95 %-99 %] 98 % (03/02 0607) Weight:  [59 kg] 59 kg (03/01 1539)  Physical Exam: General: Alert and cooperative and appears to be in no acute distress HEENT: Neck non-tender without lymphadenopathy, no elevated JVD Cardio: Normal S1 and S2, no S3 or S4. Rhythm is regular. No murmurs or rubs.   Pulm: Clear to auscultation bilaterally, no crackles, wheezing, or diminished breath sounds. Normal respiratory effort Abdomen: Bowel sounds normal. Abdomen soft and non-tender.  Extremities: No peripheral edema. Warm/ well perfused.  Strong radial pulses. Neuro: Cranial nerves grossly intact   Laboratory: Recent Labs  Lab 05/02/18 1543  WBC 9.6  HGB 13.5  HCT 39.8  PLT 283   Recent Labs  Lab 05/02/18 1543  NA 137  K 3.5  CL 110  CO2 20*  BUN 10  CREATININE 1.03*  CALCIUM 8.7*  GLUCOSE 101*    Imaging/Diagnostic Tests: Dg Chest 2 View  Result Date: 05/02/2018 CLINICAL  DATA:  Chest pain EXAM: CHEST - 2 VIEW COMPARISON:  04/15/2017 FINDINGS: The heart size and mediastinal contours are within normal limits. Both lungs are clear. The visualized skeletal structures are unremarkable.  IMPRESSION: No active cardiopulmonary disease. Electronically Signed   By: Elige Ko   On: 05/02/2018 16:34     Mirian Mo, MD 05/03/2018, 6:34 AM PGY-1, [facility] Family Medicine FPTS Intern pager: [phone], text pages welcome

## 2018-05-03 NOTE — Progress Notes (Addendum)
OT Treatment Note    05/03/18 1700  OT Visit Information  Last OT Received On 05/03/18  Assistance Needed +2  History of Present Illness 63yo female presenting with central chest pain for past 5-7 days, worsened on 2/29, also with chornic recurrent HA with L facial drooping with improves as HA resolves. Admitted for workup to rule out ACS. PMH CAD s/p5 stentss, DM, HTN, hx cardiac cath   Precautions  Precautions Fall;Other (comment)  Precaution Comments ongoing L hip pain and B LE numbness below knees   Pain Assessment  Pain Assessment Faces  Faces Pain Scale 4  Pain Location back and hip  Pain Descriptors / Indicators Aching;Grimacing  Pain Intervention(s) Limited activity within patient's tolerance  Cognition  Arousal/Alertness Awake/alert  Behavior During Therapy WFL for tasks assessed/performed  Overall Cognitive Status Within Functional Limits for tasks assessed  Upper Extremity Assessment  LUE Deficits / Details neoprene strap used to pull thumb into abduction  and extension to reduce tone and increase funcitonal use of hand with good result. Pt educated on techniques and verbalized understanding.   ADL  General ADL Comments Pt issued long handled sponge and sponge to assist wtih ADL and ADL tasks. Pt needs tub bench for safe tub transfers to reduce risk of falls.  General Comments  General comments (skin integrity, edema, etc.) Discussed need for wc with CM and resident. wc will decrease risk of falls, especially within community mobility. wc Pt states she currently has difficulty going to doctor appointments bc she can not walk that far due to her "back going out and causing falls"  OT - End of Session  Activity Tolerance Patient tolerated treatment well  Patient left in bed;with call bell/phone within reach  Nurse Communication Mobility status;Other (comment) (DC needs)  OT Assessment/Plan  OT Plan Discharge plan remains appropriate  OT Visit Diagnosis Unsteadiness on feet  (R26.81);Apraxia (R48.2);Pain  Pain - Right/Left Right  Pain - part of body Hip  Follow Up Recommendations No OT follow up;Supervision - Intermittent  OT Equipment Tub/shower bench;Wheelchair (measurements OT);Wheelchair cushion (measurements OT)  AM-PAC OT "6 Clicks" Daily Activity Outcome Measure (Version 2)  Help from another person eating meals? 4  Help from another person taking care of personal grooming? 4  Help from another person toileting, which includes using toliet, bedpan, or urinal? 4  Help from another person bathing (including washing, rinsing, drying)? 4  Help from another person to put on and taking off regular upper body clothing? 4  Help from another person to put on and taking off regular lower body clothing? 4  6 Click Score 24  OT Goal Progression  Progress towards OT goals Progressing toward goals  Acute Rehab OT Goals  Patient Stated Goal stop falling, get her own place   OT Goal Formulation With patient  OT Time Calculation  OT Start Time (ACUTE ONLY) 1640  OT Stop Time (ACUTE ONLY) 1658  OT Time Calculation (min) 18 min  OT General Charges  $OT Visit 1 Visit  OT Treatments  $Self Care/Home Management  8-22 mins  Luisa Dago, OT/L   Acute OT Clinical Specialist Acute Rehabilitation Services Pager [phone] Office [phone]
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
## 2018-05-03 NOTE — NC FL2 (Signed)
[place] MEDICAID FL2 LEVEL OF CARE SCREENING TOOL     IDENTIFICATION  Patient Name: Kelly [name] Birthdate: [birth year] Sex: female Admission Date (Current Location): 05/02/2018  [facility] and [place] Number:  Producer, television/film/video and Address:  [facility] [place], [address], [place], [place] [address]      Provider Number: [id number]  Attending Physician Name and Address:  Doreene Eland, MD  Relative Name and Phone Number:       Current Level of Care: Hospital Recommended Level of Care: Skilled Nursing Facility Prior Approval Number:    Date Approved/Denied:   PASRR Number: [id number]  Discharge Plan: SNF    Current Diagnoses: Patient Active Problem List   Diagnosis Date Noted  . Chest pain 05/02/2018    Orientation RESPIRATION BLADDER Height & Weight     Self, Time, Situation, Place  Normal Continent Weight: 130 lb (59 kg) Height:  5\' 5"  (165.1 cm)  BEHAVIORAL SYMPTOMS/MOOD NEUROLOGICAL BOWEL NUTRITION STATUS      Incontinent Diet(heart healthy/carb modified, thin liquids)  AMBULATORY STATUS COMMUNICATION OF NEEDS Skin   Limited Assist Verbally Normal                       Personal Care Assistance Level of Assistance  Dressing, Feeding, Bathing Bathing Assistance: Limited assistance Feeding assistance: Independent Dressing Assistance: Limited assistance     Functional Limitations Info  Sight, Hearing, Speech Sight Info: Adequate Hearing Info: Adequate Speech Info: Adequate    SPECIAL CARE FACTORS FREQUENCY  PT (By licensed PT), OT (By licensed OT)     PT Frequency: 3x OT Frequency: 3x            Contractures Contractures Info: Not present    Additional Factors Info  Code Status, Allergies Code Status Info: Full Code Allergies Info: Morphine And Related, Other           Current Medications (05/03/2018):  This is the current hospital active medication list Current Facility-Administered Medications  Medication  Dose Route Frequency Provider Last Rate Last Dose  . acetaminophen (TYLENOL) tablet 650 mg  650 mg Oral Q6H PRN Ellwood Dense, DO   650 mg at 05/03/18 0640  . aspirin EC tablet 81 mg  81 mg Oral Daily Ellwood Dense, DO   81 mg at 05/03/18 0831  . heparin injection 5,000 Units  5,000 Units Subcutaneous Q8H Ellwood Dense, DO   5,000 Units at 05/03/18 0321  . [START ON 05/04/2018] Influenza vac split quadrivalent PF (FLUARIX) injection 0.5 mL  0.5 mL Intramuscular Tomorrow-1000 Janit Pagan T, MD      . Melatonin TABS 9 mg  9 mg Oral QHS Ellwood Dense, DO   9 mg at 05/02/18 2205  . nicotine (NICODERM CQ - dosed in mg/24 hours) patch 14 mg  14 mg Transdermal Daily Rumball, Alison, DO      . nitroGLYCERIN (NITROSTAT) SL tablet 0.4 mg  0.4 mg Sublingual Q5 Min x 3 PRN Ellwood Dense, DO      . ondansetron (ZOFRAN) injection 4 mg  4 mg Intravenous Q6H PRN Ellwood Dense, DO   4 mg at 05/03/18 1136  . pantoprazole (PROTONIX) EC tablet 40 mg  40 mg Oral Daily Ellwood Dense, DO   40 mg at 05/03/18 0831  . rosuvastatin (CRESTOR) tablet 20 mg  20 mg Oral q1800 Ellwood Dense, DO         Discharge Medications: Please see discharge summary for a list of discharge  medications.  Relevant Imaging Results:  Relevant Lab Results:   Additional Information SSN: [id number]  Maree Krabbe, LCSW
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
## 2018-05-03 NOTE — Care Management Note (Addendum)
Case Management Note  Patient Details  Name: Kelly [name] MRN: [id number] Date of Birth: Aug 15, 1955  Subjective/Objective: 63 yo female presented with CP; PMH: CAD s/p 5 stents,Type 2 Diabetes, HTN, HLD, depression,GERD, tobacco use, MJ use, hypothyroidism s/p thyroidectomy, cervical stenosis.                   Action/Plan: CM met with patient to discuss dispositional needs. Patient states living at home with her daughters friend and being independent with her ADLs, with a history of frequent falls. PCP: Dr. Josefine Class but patient is requesting assistance with obtaining another PCP. Hospital f/u appointment for obtaining a new PCP is arranged at: [facility] on 05/31/18 @ 1430. PT/OT eval complete with patient declining transitioning to a SNF and is refusing HH, but is agreeable to a tub bench and wheelchair. DME preference provided with [employer] selected; DME referral given to [facility] [place] liaison; AVS updated. Patient indicated she will need transportation home with a cab voucher to be provided; patient stated her son will be available to assist patient into her home. No further needs from CM.   Expected Discharge Date:                  Expected Discharge Plan:  Home/Self Care  In-House Referral:  Clinical Social Work  Discharge planning Services  CM Consult  Post Acute Care Choice:  Durable Medical Equipment Choice offered to:  Patient  DME Arranged:  Tub bench DME Agency:  [place]:  Patient Refused [place], Refused SNF [facility] Agency:  NA  Status of Service:  Completed, signed off  If discussed at [place] of Stay Meetings, dates discussed:    Additional Comments:  Midge Minium RN, BSN, NCM-BC, ACM-RN [phone] 05/03/2018, 4:33 PM
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
## 2018-05-03 NOTE — Progress Notes (Signed)
Patient is admitted to 5 central room 4 with the diagnosis of chest pain. A & O x 4 . Denied any acute pain at this moment. Patient oriented to her room,ascom/call bell and staff. Bed alarm activated and the bed is in the lowest position. Will continue to monitor.

## 2018-05-03 NOTE — Progress Notes (Signed)
PT Cancellation Note  Patient Details Name: Promyse Gunnison MRN: [id number] DOB: 1955-06-20   Cancelled Treatment:    Reason Eval/Treat Not Completed: Patient at procedure or test/unavailable attempted to work with patient, she had just left for ECHO study. Will attempt to return if time/schedule allow.    Nedra Hai PT, DPT, CBIS  Supplemental Physical Therapist [facility]    Pager [phone] Acute Rehab Office [phone]

## 2018-05-03 NOTE — Discharge Summary (Signed)
Family Medicine Teaching [facility] Discharge Summary  Patient name: Kelly [name] Medical record number: [id number] Date of birth: [birth year] Age: 63 y.o. Gender: female Date of Admission: 05/02/2018  Date of Discharge: 05/04/2018 Admitting Physician: Doreene Eland, MD  Primary Care Provider: Norval Gable, DO Consultants: PT/OT, SW  Indication for Hospitalization:  ACS Rule Out  Discharge Diagnoses/Problem List:  Chest Pain Poor Social Support CAD HTN T2DM - A1c 5.0% HLD Depression Anxiety GERD Substance Use Disorder - Tobacco, Marijuana Hypothyroidism s/p Thyroidectomy Cervical Stenosis  Disposition: DC home  Discharge Condition: Stable  Discharge Exam:  General: Alert and cooperative and appears to be in no acute distress HEENT: Neck non-tender without lymphadenopathy Cardio: Normal S1 and S2, no S3 or S4. Rhythm is regular. No murmurs or rubs.   Pulm: Clear to auscultation bilaterally, no crackles, wheezing, or diminished breath sounds. Normal respiratory effort Abdomen: Bowel sounds normal. Abdomen soft and non-tender.  Extremities: No peripheral edema. Warm/ well perfused.  Strong radial pulse. Neuro: Cranial nerves grossly intact   Brief Hospital Course:   ACS rule out Number of is a 63 year old woman presenting with nausea and chest pain.  Her previous medical history significant for CAD s/p 5 stents, diabetes, hypertension, hyperlipidemia, anxiety/depression, GERD, hypothyroidism.  Her initial presentation was mildly suspicious for MI and she was admitted for ACS rule out.  Overnight, her troponins remained unremarkable, her morning EKG showed no ST changes, T wave changes or Q waves.  Her echocardiogram showed a normal EF with impaired relaxation.  On the morning of 3/2, she is feeling significantly improved without signs of cardiac etiology to her symptoms.  It is likely that her poorly controlled chronic medical issues have contributed to her her  presentation.   Cancer screening On admission, she noted decreased appetite and a potential 100 pound weight loss in the past year.  This is highly suspicious for cancer.  It is difficult to confirm the noted weight loss that she has received the majority of her care at [facility].  We recommend age-appropriate cancer screening follow-up with her primary care physician.  Lumbar Stenosis On admission she noted significantly impaired mobility in her lower extremities secondary to lumbar stenosis.  He was evaluated by PT/OT during her hospitalization both of whom recommended SNF placement.  She declined SNF placement and home health.  A wheelchair was ordered to improve her mobility at home.  Poor follow-up She reports that she had not seen a primary care physician for roughly 6 months prior to this hospitalization.  She reports that she did not get along with her last primary care provider.  Social work explained her that she has the ability to change her primary care provider and the list of potential Medicaid providers was given to her.  She was strongly encouraged to choose a new provider for better control of her chronic medical issues.    Issues for Follow Up:  1. Please perform age appropriate cancer screening including: Mammogram, low-dose chest CT scan (not yet 63 but showing significant symptoms - weight loss), colonoscopy and Pap smear. 2. Please address and provide appropriate treatment for her history of hypothyroidism, history of diabetes, hyperlipidemia, lumbar stenosis.  Significant Procedures: none  Significant Labs and Imaging:  Recent Labs  Lab 05/02/18 1543 05/03/18 0758  WBC 9.6 7.3  HGB 13.5 11.8*  HCT 39.8 35.4*  PLT 283 254   Recent Labs  Lab 05/02/18 1543 05/03/18 0758  NA 137 139  K 3.5 4.0  CL 110 109  CO2 20* 24  GLUCOSE 101* 124*  BUN 10 13  CREATININE 1.03* 1.15*  CALCIUM 8.7* 8.0*   Lipid panel Total cholesterol 154 HDL 28 LDL 94 Triglycerides  161 VLDL 32  Hemoglobin A1c 5.0  TSH 1.9   Results/Tests Pending at Time of Discharge: none  Discharge Medications:  Allergies as of 05/04/2018      Reactions   Morphine And Related Nausea And Vomiting   Severe nausea and dry heaves   Other Other (See Comments)   "Many antibiotics cause severe yeast infections" (can tolerate Z-Paks)      Medication List    STOP taking these medications   GOODY HEADACHE PO     TAKE these medications   citalopram 20 MG tablet Commonly known as:  CELEXA Take 20 mg by mouth daily.   diphenhydramine-acetaminophen 25-500 MG Tabs tablet Commonly known as:  TYLENOL PM Take 1 tablet by mouth 4 (four) times daily.   IBUPROFEN PM 200-25 MG Caps Generic drug:  Ibuprofen-diphenhydrAMINE HCl Take 1-2 tablets by mouth at bedtime.   levothyroxine 88 MCG tablet Commonly known as:  SYNTHROID, LEVOTHROID Take 88 mcg by mouth daily before breakfast.   Melatonin 10 MG Tabs Take 10 mg by mouth at bedtime.   nitroGLYCERIN 0.4 MG SL tablet Commonly known as:  NITROSTAT Place 0.4 mg under the tongue every 5 (five) minutes as needed for chest pain.   pantoprazole 40 MG tablet Commonly known as:  PROTONIX Take 40 mg by mouth daily.   pregabalin 150 MG capsule Commonly known as:  LYRICA Take 150 mg by mouth 2 (two) times daily.   rosuvastatin 20 MG tablet Commonly known as:  CRESTOR Take 1 tablet (20 mg total) by mouth daily at 6 PM.   topiramate 25 MG tablet Commonly known as:  TOPAMAX Take 25 mg by mouth at bedtime.            Durable Medical Equipment  (From admission, onward)         Start     Ordered   05/04/18 0000  For home use only DME standard manual wheelchair with seat cushion    Comments:  Patient suffers from lumbar stenosis which impairs their ability to perform daily activities like bathing, dressing and grooming in the home.  A cane will not resolve issue with performing activities of daily living. A wheelchair will  allow patient to safely perform daily activities. Patient can safely propel the wheelchair in the home or has a caregiver who can provide assistance.  Accessories: elevating leg rests (ELRs), wheel locks, extensions and anti-tippers.   05/04/18 0803          Discharge Instructions: Please refer to Patient Instructions section of EMR for full details.  Patient was counseled important signs and symptoms that should prompt return to medical care, changes in medications, dietary instructions, activity restrictions, and follow up appointments.   Follow-Up Appointments: Follow-up Information    Norval Gable, DO. Schedule an appointment as soon as possible for a visit in 1 week(s).   Specialty:  Family Medicine Contact information: [address] [place] [address] [phone]        Primary Care at [facility]. Go on 05/31/2018.   Specialty:  Family Medicine Why:  at 2:30pm for your hospital follow-up appoint and to establish a new primary care physician Contact information: [address] [place] [address] [phone]       [facility] Follow  up.   Why:  wheelchair and tub bench Contact information: Phone: [phone] Fax: [phone]           Mirian Mo, MD 05/04/2018, 3:31 PM PGY-1, [facility]

## 2018-05-03 NOTE — Progress Notes (Signed)
  Echocardiogram 2D Echocardiogram has been performed.  Kelly [name] 05/03/2018, 9:09 AM

## 2018-05-04 MED ORDER — ROSUVASTATIN CALCIUM 20 MG PO TABS: 20.0000 mg | ORAL_TABLET | Freq: Every day | ORAL | 0 refills | Status: DC

## 2018-05-04 NOTE — Discharge Instructions (Signed)
Acute Pain, Adult °Acute pain is a type of pain that may last for just a few days or as long as six months. It is often related to an illness, injury, or medical procedure. Acute pain may be mild, moderate, or severe. It usually goes away once your injury has healed or you are no longer ill. °Pain can make it hard for you to do daily activities. It can cause anxiety and lead to other problems if left untreated. Treatment depends on the cause and severity of your acute pain. °Follow these instructions at home: °· Check your pain level as told by your health care provider. °· Take over-the-counter and prescription medicines only as told by your health care provider. °· If you are taking prescription pain medicine: °? Ask your health care provider about taking a stool softener or laxative to prevent constipation. °? Do not stop taking the medicine suddenly. Talk to your health care provider about how and when to discontinue prescription pain medicine. °? If your pain is severe, do not take more pills than instructed by your health care provider. °? Do not take other over-the-counter pain medicines in addition to this medicine unless told by your health care provider. °? Do not drive or operate heavy machinery while taking prescription pain medicine. °· Apply ice or heat as told by your health care provider. These may reduce swelling and pain. °· Ask your health care provider if other strategies such as distraction, relaxation, or physical therapies can help your pain. °· Keep all follow-up visits as told by your health care provider. This is important. °Contact a health care provider if: °· You have pain that is not controlled by medicine. °· Your pain does not improve or gets worse. °· You have side effects from pain medicines, such as vomiting or confusion. °Get help right away if: °· You have severe pain. °· You have trouble breathing. °· You lose consciousness. °· You have chest pain or pressure that lasts for more  than a few minutes. Along with the chest pain you may: °? Have pain or discomfort in one or both arms, your back, neck, jaw, or stomach. °? Have shortness of breath. °? Break out in a cold sweat. °? Feel nauseous. °? Become light-headed. °These symptoms may represent a serious problem that is an emergency. Do not wait to see if the symptoms will go away. Get medical help right away. Call your local emergency services (911 in the [place] Do not drive yourself to the hospital. °This information is not intended to replace advice given to you by your health care provider. Make sure you discuss any questions you have with your health care provider. °Document Released: 03/04/2015 Document Revised: 07/27/2015 Document Reviewed: 03/04/2015 °Elsevier Interactive Patient Education © 2019 Elsevier Inc. ° °

## 2018-05-04 NOTE — Progress Notes (Signed)
The patient denies any new concern today. Her chest pain has resolved. ACS ruled out. Chest pain is likely atypical/MSK pain.  Headache: no neurologic deficit on the exam. F/U with PCP for migraine headache management.  Frailty and social issues. SNF recommended, but she declined. We will send home with HHA.  Her other chronic issues are stable.  I will cosign the resident's note once it is completed today.

## 2018-05-28 ENCOUNTER — Telehealth: Payer: Self-pay

## 2018-05-28 NOTE — Telephone Encounter (Signed)
Called patient to do their pre-visit COVID screening.  Patient answered the phone & then hung up when I stated where I was calling from.

## 2018-05-31 ENCOUNTER — Encounter: Payer: Self-pay | Admitting: Family Medicine

## 2018-05-31 ENCOUNTER — Ambulatory Visit (INDEPENDENT_AMBULATORY_CARE_PROVIDER_SITE_OTHER): Payer: Medicaid Other | Admitting: Family Medicine

## 2018-05-31 ENCOUNTER — Other Ambulatory Visit: Payer: Self-pay

## 2018-05-31 ENCOUNTER — Telehealth: Payer: Self-pay | Admitting: Family Medicine

## 2018-05-31 VITALS — BP 164/69 | HR 67 | Temp 98.0°F | Resp 18 | Ht 62.0 in | Wt 168.8 lb

## 2018-05-31 DIAGNOSIS — M546 Pain in thoracic spine: Secondary | ICD-10-CM

## 2018-05-31 DIAGNOSIS — G8929 Other chronic pain: Secondary | ICD-10-CM

## 2018-05-31 DIAGNOSIS — I25810 Atherosclerosis of coronary artery bypass graft(s) without angina pectoris: Secondary | ICD-10-CM

## 2018-05-31 DIAGNOSIS — F329 Major depressive disorder, single episode, unspecified: Secondary | ICD-10-CM

## 2018-05-31 DIAGNOSIS — F32A Depression, unspecified: Secondary | ICD-10-CM

## 2018-05-31 DIAGNOSIS — Z1231 Encounter for screening mammogram for malignant neoplasm of breast: Secondary | ICD-10-CM

## 2018-05-31 DIAGNOSIS — E039 Hypothyroidism, unspecified: Secondary | ICD-10-CM

## 2018-05-31 DIAGNOSIS — Z7689 Persons encountering health services in other specified circumstances: Secondary | ICD-10-CM

## 2018-05-31 DIAGNOSIS — I10 Essential (primary) hypertension: Secondary | ICD-10-CM | POA: Diagnosis not present

## 2018-05-31 DIAGNOSIS — F411 Generalized anxiety disorder: Secondary | ICD-10-CM | POA: Diagnosis not present

## 2018-05-31 DIAGNOSIS — K219 Gastro-esophageal reflux disease without esophagitis: Secondary | ICD-10-CM

## 2018-05-31 MED ORDER — LOSARTAN POTASSIUM 25 MG PO TABS: 25.0000 mg | ORAL_TABLET | Freq: Every day | ORAL | 1 refills | Status: DC

## 2018-05-31 MED ORDER — LEVOTHYROXINE SODIUM 25 MCG PO TABS: 25.0000 ug | ORAL_TABLET | Freq: Every day | ORAL | 1 refills | Status: DC

## 2018-05-31 MED ORDER — GABAPENTIN 300 MG PO CAPS: 300.0000 mg | ORAL_CAPSULE | Freq: Three times a day (TID) | ORAL | 3 refills | Status: DC

## 2018-05-31 MED ORDER — PANTOPRAZOLE SODIUM 40 MG PO TBEC: 40.0000 mg | DELAYED_RELEASE_TABLET | Freq: Every day | ORAL | 3 refills | Status: DC

## 2018-05-31 MED ORDER — ONDANSETRON 4 MG PO TBDP: 4.0000 mg | ORAL_TABLET | Freq: Three times a day (TID) | ORAL | 2 refills | Status: DC | PRN

## 2018-05-31 MED ORDER — CITALOPRAM HYDROBROMIDE 20 MG PO TABS: 20.0000 mg | ORAL_TABLET | Freq: Every day | ORAL | 3 refills | Status: DC

## 2018-05-31 MED ORDER — TOPIRAMATE 25 MG PO TABS: 25.0000 mg | ORAL_TABLET | Freq: Every day | ORAL | 1 refills | Status: DC

## 2018-05-31 NOTE — Patient Instructions (Addendum)
Thank you for choosing Primary Care at [facility] to be your medical home!    Kelly [name] was seen by Joaquin Courts, FNP today.   Kelly [name]'s primary care provider is Bing Neighbors, FNP.   For the best care possible, you should try to see Joaquin Courts, FNP-C whenever you come to the clinic.   We look forward to seeing you again soon!  If you have any questions about your visit today, please call us at [phone] or feel free to reach your primary care provider via MyChart.     To schedule your breast exam, please call the  Breast Clinic at   Phone: [phone]  Follow-up with your neuro-specialist Dr. Dario Guardian, to schedule an appointment regarding your back.   Please schedule appointment visit with our Licensed Clinical Counselor for phone counseling to discuss anxiety and depression.   Major Depressive Disorder, Adult Major depressive disorder (MDD) is a mental health condition. MDD often makes you feel sad, hopeless, or helpless. MDD can also cause symptoms in your body. MDD can affect your:  Work.  School.  Relationships.  Other normal activities. MDD can range from mild to very bad. It may occur once (single episode MDD). It can also occur many times (recurrent MDD). The main symptoms of MDD often include:  Feeling sad, depressed, or irritable most of the time.  Loss of interest. MDD symptoms also include:  Sleeping too much or too little.  Eating too much or too little.  A change in your weight.  Feeling tired (fatigue) or having low energy.  Feeling worthless.  Feeling guilty.  Trouble making decisions.  Trouble thinking clearly.  Thoughts of suicide or harming others.  Feeling weak.  Feeling agitated.  Keeping yourself from being around other people (isolation). Follow these instructions at home: Activity  Do these things as told by your doctor: ? Go back to your normal activities. ? Exercise regularly. ? Spend time  outdoors. Alcohol  Talk with your doctor about how alcohol can affect your antidepressant medicines.  Do not drink alcohol. Or, limit how much alcohol you drink. ? This means no more than 1 drink a day for nonpregnant women and 2 drinks a day for men. One drink equals one of these:  12 oz of beer.  5 oz of wine.  1 oz of hard liquor. General instructions  Take over-the-counter and prescription medicines only as told by your doctor.  Eat a healthy diet.  Get plenty of sleep.  Find activities that you enjoy. Make time to do them.  Think about joining a support group. Your doctor may be able to suggest a group for you.  Keep all follow-up visits as told by your doctor. This is important. Where to find more information:  The [employer] on Mental Illness: ? www.nami.org  [place] [employer] of Mental Health: ? http://www.maynard.net/  National Suicide Prevention Lifeline: ? (253) 502-0505. This is free, 24-hour help. Contact a doctor if:  Your symptoms get worse.  You have new symptoms. Get help right away if:  You self-harm.  You see, hear, taste, smell, or feel things that are not present (hallucinate). If you ever feel like you may hurt yourself or others, or have thoughts about taking your own life, get help right away. You can go to your nearest emergency department or call:  Your local emergency services (911 in the [place]  A suicide crisis helpline, such as the National Suicide Prevention Lifeline: ? 847-347-0359. This is open  24 hours a day. This information is not intended to replace advice given to you by your health care provider. Make sure you discuss any questions you have with your health care provider. Document Released: 01/29/2015 Document Revised: 11/04/2015 Document Reviewed: 11/04/2015 Elsevier Interactive Patient Education  2019 [employer].   Hypertension Hypertension is another name for high blood pressure. High blood pressure forces  your heart to work harder to pump blood. This can cause problems over time. There are two numbers in a blood pressure reading. There is a top number (systolic) over a bottom number (diastolic). It is best to have a blood pressure below 120/80. Healthy choices can help lower your blood pressure. You may need medicine to help lower your blood pressure if:  Your blood pressure cannot be lowered with healthy choices.  Your blood pressure is higher than 130/80. Follow these instructions at home: Eating and drinking   If directed, follow the DASH eating plan. This diet includes: ? Filling half of your plate at each meal with fruits and vegetables. ? Filling one quarter of your plate at each meal with whole grains. Whole grains include whole wheat pasta, brown rice, and whole grain bread. ? Eating or drinking low-fat dairy products, such as skim milk or low-fat yogurt. ? Filling one quarter of your plate at each meal with low-fat (lean) proteins. Low-fat proteins include fish, skinless chicken, eggs, beans, and tofu. ? Avoiding fatty meat, cured and processed meat, or chicken with skin. ? Avoiding premade or processed food.  Eat less than 1,500 mg of salt (sodium) a day.  Limit alcohol use to no more than 1 drink a day for nonpregnant women and 2 drinks a day for men. One drink equals 12 oz of beer, 5 oz of wine, or 1 oz of hard liquor. Lifestyle  Work with your doctor to stay at a healthy weight or to lose weight. Ask your doctor what the best weight is for you.  Get at least 30 minutes of exercise that causes your heart to beat faster (aerobic exercise) most days of the week. This may include walking, swimming, or biking.  Get at least 30 minutes of exercise that strengthens your muscles (resistance exercise) at least 3 days a week. This may include lifting weights or pilates.  Do not use any products that contain nicotine or tobacco. This includes cigarettes and e-cigarettes. If you need  help quitting, ask your doctor.  Check your blood pressure at home as told by your doctor.  Keep all follow-up visits as told by your doctor. This is important. Medicines  Take over-the-counter and prescription medicines only as told by your doctor. Follow directions carefully.  Do not skip doses of blood pressure medicine. The medicine does not work as well if you skip doses. Skipping doses also puts you at risk for problems.  Ask your doctor about side effects or reactions to medicines that you should watch for. Contact a doctor if:  You think you are having a reaction to the medicine you are taking.  You have headaches that keep coming back (recurring).  You feel dizzy.  You have swelling in your ankles.  You have trouble with your vision. Get help right away if:  You get a very bad headache.  You start to feel confused.  You feel weak or numb.  You feel faint.  You get very bad pain in your: ? Chest. ? Belly (abdomen).  You throw up (vomit) more than once.  You  have trouble breathing. Summary  Hypertension is another name for high blood pressure.  Making healthy choices can help lower blood pressure. If your blood pressure cannot be controlled with healthy choices, you may need to take medicine. This information is not intended to replace advice given to you by your health care provider. Make sure you discuss any questions you have with your health care provider. Document Released: 08/06/2007 Document Revised: 01/16/2016 Document Reviewed: 01/16/2016 Elsevier Interactive Patient Education  2019 [employer].

## 2018-05-31 NOTE — Telephone Encounter (Signed)
Erroneous

## 2018-05-31 NOTE — Progress Notes (Signed)
Kelly [name], is a 63 y.o. female  XBJ:[id number]  HYQ:[id number]  DOB - 1955/12/07  CC:  Chief Complaint  Patient presents with  . Establish Care  . Hospitalization Follow-up    ED-> [facility] 3/1-3/3: chest pain, lumbar stenosis       HPI: Kelly [name] is a 63 y.o. female, current smoker, is here today to establish care and for hospital follow-up.  Kelly [name] medical history is significant for CAD, history 5 stent replacements, depression, anxiety, hypertension, chronic back pain, fibromyalgia, and unintended weight loss.  Hospital Follow-up  Kelly [name] was utilized on 05/02/2018 presenting to the ER with a complaint of nausea and chest pain.  Patient's troponins were negative.  She underwent an echocardiogram which was normal.  EKG showed no ST changes. TSH normal range, renal function normal, mild anemia present. Patient was subsequently discharged on 05/04/18 and advised to follow-up here today for chronic condition management. During patient's brief hospitalization, she was recommended for discharge to SNF or in home PT/OT. She declined both. Kelly [name] reports that she and her son (who suffers from Autism) have always live together, SNF would not be an option as son would not have anywhere to live. Kelly [name] and her son currently reside with someone that is providing housing until she can find house that she can afford. Due to lack of personal house, in home OT/PT would not be an option.   Kelly [name] was a prior patient at [facility] center. She is a poor historian regarding medications she was last prescribed. Suffers from hypertension and recalls being prescribed a medication that starts with "L". She reports while at [facility], she was followed by there cardiologist, pain clinic, and primary care. She was prescribed Brillinta, however is uncertain of when medication was last prescribed or last taken. She admits to nonadherence to her medication regimen as she reports that her daughter was her designated Copy and was not providing adequate money to cover her medications. Patient is now in charge of her on finances. She is willing to be referred to [facility]ologist.   Chronic lumbar stenosis / back pain Patient is followed by Dr. Maurice Small at [facility] for management of lumbar stenosis. Due to the severity of back pain, patient is now confined to a wheelchair and requires assistance with most ADL.Reports a distant history of back surgery that did not correct or improve back pain. She has not follow-up with [facility] since September 2019. Patient requests a referral to pain management.  Unintended weight loss Patient reports gradual unintentional weight loss over the course of the last few years. She has noticed a greater weight loss within the last 12 months. Reports a poor appetite secondary nausea. Suffers from GERD, has been without medication for months. Certain food worsen symptoms of nausea.  Overdue health maintenance -Mammogram , no known history of abnormal PAP -PAP -Colonoscopy   Depression and Anxiety Long history of both depression and anxiety. She has been without her previously prescribed SSRI for several months. She endorses suicidal ideation without a plan. Stressors include current living situation, chronic pan, and overall health. She felt the Celexa was effective in the past with management of both depression and anxiety symptoms.    Office Visit from 05/31/2018 in Primary Care at [facility] Total Score  20     GAD 7 : Generalized Anxiety Score 05/31/2018  Nervous, Anxious, on Edge 3  Control/stop worrying 3  Worry too much - different things 3  Trouble relaxing 3  Restless 3  Easily annoyed or irritable 3  Afraid - awful might happen 3  Total GAD 7 Score 21      Current medications: Current Outpatient Medications:  .  citalopram (CELEXA) 20 MG tablet, Take 1 tablet (20 mg total) by mouth daily., Disp: 30 tablet, Rfl: 3 .   diphenhydramine-acetaminophen (TYLENOL PM) 25-500 MG TABS tablet, Take 1 tablet by mouth 4 (four) times daily., Disp: , Rfl:  .  gabapentin (NEURONTIN) 300 MG capsule, Take 1 capsule (300 mg total) by mouth 3 (three) times daily., Disp: 90 capsule, Rfl: 3 .  levothyroxine (SYNTHROID, LEVOTHROID) 25 MCG tablet, Take 1 tablet (25 mcg total) by mouth daily before breakfast., Disp: 90 tablet, Rfl: 1 .  losartan (COZAAR) 25 MG tablet, Take 1 tablet (25 mg total) by mouth daily., Disp: 90 tablet, Rfl: 1 .  Melatonin 10 MG TABS, Take 10 mg by mouth at bedtime., Disp: , Rfl:  .  nitroGLYCERIN (NITROSTAT) 0.4 MG SL tablet, Place 0.4 mg under the tongue every 5 (five) minutes as needed for chest pain., Disp: , Rfl:  .  ondansetron (ZOFRAN ODT) 4 MG disintegrating tablet, Take 1 tablet (4 mg total) by mouth every 8 (eight) hours as needed for nausea or vomiting., Disp: 30 tablet, Rfl: 2 .  pantoprazole (PROTONIX) 40 MG tablet, Take 1 tablet (40 mg total) by mouth daily., Disp: 90 tablet, Rfl: 3 .  pregabalin (LYRICA) 150 MG capsule, Take 150 mg by mouth 2 (two) times daily., Disp: , Rfl: 0 .  topiramate (TOPAMAX) 25 MG tablet, Take 1 tablet (25 mg total) by mouth daily., Disp: 90 tablet, Rfl: 1   Pertinent family medical history: family history includes Alzheimer's disease in her father and mother; Heart disease in her mother.   Allergies  Allergen Reactions  . Morphine And Related Nausea And Vomiting    Severe nausea and dry heaves  . Other Other (See Comments)    "Many antibiotics cause severe yeast infections" (can tolerate Z-Paks)    Social History   Socioeconomic History  . Marital status: Divorced    Spouse name: Not on file  . Number of children: Not on file  . Years of education: Not on file  . Highest education level: Not on file  Occupational History  . Not on file  Social Needs  . Financial resource strain: Not on file  . Food insecurity:    Worry: Not on file    Inability: Not on  file  . Transportation needs:    Medical: Not on file    Non-medical: Not on file  Tobacco Use  . Smoking status: Current Every Day Smoker  . Smokeless tobacco: Never Used  Substance and Sexual Activity  . 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 Drug use: Yes    Types: Marijuana  . Sexual activity: Not on file  Lifestyle  . Physical activity:    Days per week: Not on file    Minutes per session: Not on file  . Stress: Not on file  Relationships  . Social connections:    Talks on phone: Not on file    Gets together: Not on file    Attends religious service: Not on file    Active member of club or organization: Not on file    Attends meetings of clubs or organizations: Not on file    Relationship status: Not on file  .  Intimate partner violence:    Fear of current or ex partner: Not on file    Emotionally abused: Not on file    Physically abused: Not on file    Forced sexual activity: Not on file  Other Topics Concern  . Not on file  Social History Narrative  . Not on file    Review of Systems: Pertinent negatives listed in HPI  Objective:   Vitals:   05/31/18 1409  BP: (!) 164/69  Pulse: 67  Resp: 18  Temp: 98 F (36.7 C)  SpO2: 97%    BP Readings from Last 3 Encounters:  05/31/18 (!) 164/69  05/04/18 (!) 123/48  08/24/17 114/63    Filed Weights   05/31/18 1409  Weight: 168 lb 12.8 oz (76.6 kg)      Physical Exam: General appearance: alert, well developed, well nourished, cooperative and in no distress Head: Normocephalic, without obvious abnormality, atraumatic Respiratory: Respirations even and unlabored, normal respiratory rate Heart: rate and rhythm normal. No gallop or murmurs noted on exam  Extremities: No gross deformities Skin: Skin color, texture, turgor normal. No rashes seen  Psych: Appropriate mood and affect. Neurologic: Mental status: Alert, oriented to person, place, and time, thought  content appropriate.  Lab Results (prior encounters)  Lab Results  Component Value Date   WBC 7.3 05/03/2018   HGB 11.8 (L) 05/03/2018   HCT 35.4 (L) 05/03/2018   MCV 99.4 05/03/2018   PLT 254 05/03/2018   Lab Results  Component Value Date   CREATININE 1.15 (H) 05/03/2018   BUN 13 05/03/2018   NA 139 05/03/2018   K 4.0 05/03/2018   CL 109 05/03/2018   CO2 24 05/03/2018    Lab Results  Component Value Date   HGBA1C 5.0 05/02/2018       Component Value Date/Time   CHOL 154 05/03/2018 0202   TRIG 161 (H) 05/03/2018 0202   HDL 28 (L) 05/03/2018 0202   CHOLHDL 5.5 05/03/2018 0202   VLDL 32 05/03/2018 0202   LDLCALC 94 05/03/2018 0202       Assessment and plan:  1. Encounter to establish care  2. Breast cancer screening by mammogram - MM 3D SCREEN BREAST BILATERAL; Future  3. Coronary artery disease involving coronary bypass graft, angina presence unspecified, unspecified whether native or transplanted heart - No r medical records to review here in office today.  However will refer patient to cardiology for further management and evaluation of coronary artery disease.  Is questionable whether or not patient should be on Brilinta or Plavix however given limitation of medical records for prescribing either medication today. - Ambulatory referral to Cardiology  4. Chronic bilateral thoracic back pain -Recommended follow-up with Dr. Maurice Small for further management of chronic bilateral thoracic pain and lumbar stenosis.  Referring to Pain Clinic-Dr. Plummer - Ambulatory referral to Pain Clinic  5. Essential hypertension, uncontrolled today. Off medications for several months. -Will start losartan 25 mg once daily and titrate dose to achieve goal BP reading  6. Depression, unspecified depression type 7. Generalized anxiety disorder -Patient scheduled for follow-up with LCSW 06/08/18 -Resuming both Celexa 20 mg once daily and Topomax 25 mg once at bedtime (prior regimen  patient was prescribed)  8. Hypothyroidism, unspecified type Recent TSH level normal -pt was previously prescribed levothyroxine 88 mcg, agreed to resume levothyroxine 25 mcg   9. GERD -Resume Protonix  -Trial Zofran to management nausea symptoms   A total of 40 minutes spent, greater than 50 %  of this time was spent reviewing EMR notes and diagnostic testing results, addressing multiple chronic conditions and medication indicated for treatment, and coordination of care.   -Will address all other health maintenance items at next follow-up. PAP, Colonoscopy, low dose CT screening.  Return in about 3 months (around 09/03/2018) for Provider 3 months and jasmine phone encounter .   The patient was given clear instructions to go to ER or return to medical center if symptoms don't improve, worsen or new problems develop. The patient verbalized understanding. The patient was advised  to call and obtain lab results if they haven't heard anything from out office within 7-10 business days.  Joaquin Courts, FNP Primary Care at [facility] [address] [place] [address] [phone]: [phone]    This note has been created with Dragon speech recognition software and Paediatric nurse. Any transcriptional errors are unintentional.

## 2018-06-08 ENCOUNTER — Institutional Professional Consult (permissible substitution): Payer: Medicaid Other | Admitting: Licensed Clinical Social Worker

## 2018-07-01 ENCOUNTER — Telehealth: Payer: Self-pay | Admitting: Family Medicine

## 2018-07-01 NOTE — Telephone Encounter (Signed)
Called patient & advised her to stop taking the Gabapentin & to go to either the urgent care or ED for evaluation.

## 2018-07-01 NOTE — Telephone Encounter (Signed)
Patient called stating that ever since she started her gabapentin she feels like she has razor blades scratching her throat even when she eats. Also states she is experiencing nausea. advised to go to the ED or UC  Please follow up

## 2018-07-28 ENCOUNTER — Ambulatory Visit: Payer: Medicaid Other

## 2018-08-31 ENCOUNTER — Ambulatory Visit (INDEPENDENT_AMBULATORY_CARE_PROVIDER_SITE_OTHER): Payer: Medicaid Other | Admitting: Family Medicine

## 2018-08-31 DIAGNOSIS — L03116 Cellulitis of left lower limb: Secondary | ICD-10-CM

## 2018-08-31 DIAGNOSIS — R112 Nausea with vomiting, unspecified: Secondary | ICD-10-CM | POA: Diagnosis not present

## 2018-08-31 DIAGNOSIS — F411 Generalized anxiety disorder: Secondary | ICD-10-CM

## 2018-08-31 DIAGNOSIS — F331 Major depressive disorder, recurrent, moderate: Secondary | ICD-10-CM | POA: Diagnosis not present

## 2018-08-31 MED ORDER — CEPHALEXIN 500 MG PO CAPS: 500.0000 mg | ORAL_CAPSULE | Freq: Three times a day (TID) | ORAL | 0 refills | Status: AC

## 2018-08-31 MED ORDER — METOCLOPRAMIDE HCL 10 MG PO TABS: 10.0000 mg | ORAL_TABLET | Freq: Four times a day (QID) | ORAL | 2 refills | Status: DC | PRN

## 2018-08-31 NOTE — Progress Notes (Deleted)
Worked up patient for their telephone visit with provider Molli Barrows, FNP-C. Verified date of birth. Patient states that the zofran is not helping with the nausea. Is requesting to go back to [facility]. Also has c/o about a pressure ulcer on her L arm. States that it's warm to the touch & draining fluid. KWalker, CMA.

## 2018-08-31 NOTE — Progress Notes (Signed)
Virtual Visit via Telephone Note  I connected with Kelly [name] on 08/31/18 at  1:50 PM EDT by telephone and verified that I am speaking with the correct person using two identifiers.  Location: Patient: Located at home during today's encounter  Provider: Located at primary care office      I discussed the limitations, risks, security and privacy concerns of performing an evaluation and management service by telephone and the availability of in person appointments. I also discussed with the patient that there may be a patient responsible charge related to this service. The patient expressed understanding and agreed to proceed.   History of Present Illness: Kelly [name] is present today via telemedicine encounter for chronic conditions follow-up. Medical problems include chronic pain, CAD with stent placement, hypertension, hypothyroidism, GERD with chronic nausea.  Depression and anxiety. Symptoms are stable. Continues to take Celexa and Topamax.  Denies suicidal ideations, homicidal ideations, or auditory hallucinations. She was scheduled for an appointment with our counselor on staff, however, has a conflict in schedule and cancelled.  Depression screen [facility] 2/9 08/31/2018 05/31/2018  Decreased Interest 2 3  Down, Depressed, Hopeless 2 3  PHQ - 2 Score 4 6  Altered sleeping 3 3  Tired, decreased energy 3 3  Change in appetite 2 2  Feeling bad or failure about yourself  2 2  Trouble concentrating 1 2  Moving slowly or fidgety/restless 1 1  Suicidal thoughts 1 1  PHQ-9 Score 17 20   Left arm abscess Silver complains of a left elbow pressure wound which now appears to be abscessed. Complain of left elbow tenderness. Wound has been present for more than 4 weeks. Denies odor to the drainage although the drainage is discolored and thick. Denies any redness circulating upward or down the arm. She has been keeping the area covered with a bandage. She denies fever, chills, weakness, or abdominal  pain.  Patient suffers from chronic nausea secondary to acid reflux disease.  She has been taking Zofran for some time for nausea symptoms although reports the medication is no longer working.  She is requesting an alternative medication to help manage symptoms of nausea.  Assessment and Plan: Cellulitis of left lower extremity - Plan: Starting patient on Keflex 500 mg twice daily x10 days.  Would like her to follow-up in office next week for evaluation of the wound.  Generalized anxiety disorder  Moderate episode of recurrent major depressive disorder (HCC)  Continue Topamax and Celexa.  Encourage patient to schedule a follow-up appointment with our counselor here on staff for further evaluation and management of generalized anxiety and depression.  Nausea and vomiting Will add Reglan 10 mg every 6 hours as needed for nausea and vomiting.  Meds ordered this encounter  Medications  . cephALEXin (KEFLEX) 500 MG capsule    Sig: Take 1 capsule (500 mg total) by mouth 3 (three) times daily for 10 days.    Dispense:  30 capsule    Refill:  0  . metoCLOPramide (REGLAN) 10 MG tablet    Sig: Take 1 tablet (10 mg total) by mouth every 6 (six) hours as needed for nausea or vomiting.    Dispense:  60 tablet    Refill:  2   Follow Up Instructions: Follow-up for in person evaluation in 1 week    I discussed the assessment and treatment plan with the patient. The patient was provided an opportunity to ask questions and all were answered. The patient agreed with the plan and  demonstrated an understanding of the instructions.   The patient was advised to call back or seek an in-person evaluation if the symptoms worsen or if the condition fails to improve as anticipated.  I provided 25 minutes of non-face-to-face time during this encounter.   Joaquin CourtsKimberly Zahira Brummond, FNP

## 2018-09-07 ENCOUNTER — Telehealth: Payer: Self-pay

## 2018-09-07 NOTE — Telephone Encounter (Signed)
We will need to reschedule appointment to telemedicine. Inquire if the patient is okay with having a COVID test ordering.

## 2018-09-07 NOTE — Telephone Encounter (Signed)
Called patient to do their pre-visit COVID screening.  Have you been tested for COVID or are you currently waiting for COVID test results? no  Have you recently traveled internationally([place], [place], [place], [place], [place]) or within the [place] to a hotspot area([place], [place], [place], [place], [place])? no  Are you currently experiencing any of the following: fever, cough, SHOB, fatigue, body aches, loss of smell, rash, diarrhea, vomiting, severe headaches, weakness, sore throat? Patient has c/o cough, fatigue, loss of smell/taste, nausea, headaches.  Have you been in contact with anyone who has recently travelled? no  Have you been in contact with anyone who is experiencing any of the above symptoms or been diagnosed with [place]  or works in or has recently visited a SNF? no

## 2018-09-08 ENCOUNTER — Ambulatory Visit: Payer: Medicaid Other | Admitting: Family Medicine

## 2018-09-08 NOTE — Telephone Encounter (Signed)
Please send for Covid test

## 2018-09-08 NOTE — Telephone Encounter (Addendum)
Attention PEC Team- Please order COVID-19 testing for this patient. She is symptomatic with COVID like symptoms as noted during office prescreening.  Molli Barrows, FNP   Message sent to [facility] pool via staff message

## 2018-09-08 NOTE — Telephone Encounter (Signed)
Attention PEC Team- Please order COVID-19 testing for this patient. She is symptomatic with COVID like symptoms as noted during office prescreening.  Molli Barrows, FNP

## 2018-09-09 ENCOUNTER — Telehealth: Payer: Self-pay | Admitting: *Deleted

## 2018-09-09 DIAGNOSIS — Z20822 Contact with and (suspected) exposure to covid-19: Secondary | ICD-10-CM

## 2018-09-09 NOTE — Telephone Encounter (Signed)
-----   Message from Scot Jun, Lula sent at 09/08/2018  9:49 AM EDT ----- Attention PEC Team- Please order COVID-19 testing for this patient. She is symptomatic with COVID like symptoms as noted during office prescreening.  Molli Barrows, FNP

## 2018-09-09 NOTE — Telephone Encounter (Signed)
Spoke with patient.  She declined being scheduled for COVID 19 test at this time.  She states she has been vomiting this morning because she "has a bad stomach", and her whole life.  Encouraged patient to get tested as GI symptoms can present with COVID 19.  She states she will call back if she decides to get tested.  PEC contact information given.  Test ordered incase patient calls back.

## 2018-10-01 ENCOUNTER — Telehealth: Payer: Self-pay

## 2018-10-01 NOTE — Telephone Encounter (Signed)

## 2018-10-02 DIAGNOSIS — M48061 Spinal stenosis, lumbar region without neurogenic claudication: Secondary | ICD-10-CM | POA: Diagnosis not present

## 2018-10-04 ENCOUNTER — Ambulatory Visit: Payer: Medicaid Other | Admitting: Internal Medicine

## 2018-10-04 DIAGNOSIS — S3992XA Unspecified injury of lower back, initial encounter: Secondary | ICD-10-CM | POA: Diagnosis not present

## 2018-10-04 DIAGNOSIS — R0902 Hypoxemia: Secondary | ICD-10-CM | POA: Diagnosis not present

## 2018-10-04 DIAGNOSIS — M8580 Other specified disorders of bone density and structure, unspecified site: Secondary | ICD-10-CM | POA: Diagnosis not present

## 2018-10-04 DIAGNOSIS — M545 Low back pain: Secondary | ICD-10-CM | POA: Diagnosis not present

## 2018-10-04 DIAGNOSIS — S39012A Strain of muscle, fascia and tendon of lower back, initial encounter: Secondary | ICD-10-CM | POA: Diagnosis not present

## 2018-10-04 DIAGNOSIS — Z955 Presence of coronary angioplasty implant and graft: Secondary | ICD-10-CM | POA: Diagnosis not present

## 2018-10-04 DIAGNOSIS — Z03818 Encounter for observation for suspected exposure to other biological agents ruled out: Secondary | ICD-10-CM | POA: Diagnosis not present

## 2018-10-04 DIAGNOSIS — M25552 Pain in left hip: Secondary | ICD-10-CM | POA: Diagnosis not present

## 2018-10-04 DIAGNOSIS — E161 Other hypoglycemia: Secondary | ICD-10-CM | POA: Diagnosis not present

## 2018-10-04 DIAGNOSIS — Z20828 Contact with and (suspected) exposure to other viral communicable diseases: Secondary | ICD-10-CM | POA: Diagnosis not present

## 2018-10-04 DIAGNOSIS — E162 Hypoglycemia, unspecified: Secondary | ICD-10-CM | POA: Diagnosis not present

## 2018-10-04 DIAGNOSIS — S79912A Unspecified injury of left hip, initial encounter: Secondary | ICD-10-CM | POA: Diagnosis not present

## 2018-10-04 DIAGNOSIS — R52 Pain, unspecified: Secondary | ICD-10-CM | POA: Diagnosis not present

## 2018-10-04 DIAGNOSIS — W050XXA Fall from non-moving wheelchair, initial encounter: Secondary | ICD-10-CM | POA: Diagnosis not present

## 2018-10-04 DIAGNOSIS — Z743 Need for continuous supervision: Secondary | ICD-10-CM | POA: Diagnosis not present

## 2018-10-04 DIAGNOSIS — W19XXXA Unspecified fall, initial encounter: Secondary | ICD-10-CM | POA: Diagnosis not present

## 2018-10-04 DIAGNOSIS — R279 Unspecified lack of coordination: Secondary | ICD-10-CM | POA: Diagnosis not present

## 2018-10-04 DIAGNOSIS — Y999 Unspecified external cause status: Secondary | ICD-10-CM | POA: Diagnosis not present

## 2018-10-04 DIAGNOSIS — S300XXA Contusion of lower back and pelvis, initial encounter: Secondary | ICD-10-CM | POA: Diagnosis not present

## 2018-10-22 DIAGNOSIS — Z89429 Acquired absence of other toe(s), unspecified side: Secondary | ICD-10-CM | POA: Diagnosis not present

## 2018-10-22 DIAGNOSIS — Z9049 Acquired absence of other specified parts of digestive tract: Secondary | ICD-10-CM | POA: Diagnosis not present

## 2018-10-22 DIAGNOSIS — Z951 Presence of aortocoronary bypass graft: Secondary | ICD-10-CM | POA: Diagnosis not present

## 2018-10-22 DIAGNOSIS — E118 Type 2 diabetes mellitus with unspecified complications: Secondary | ICD-10-CM | POA: Diagnosis not present

## 2018-10-22 DIAGNOSIS — D72829 Elevated white blood cell count, unspecified: Secondary | ICD-10-CM | POA: Diagnosis not present

## 2018-10-22 DIAGNOSIS — I2510 Atherosclerotic heart disease of native coronary artery without angina pectoris: Secondary | ICD-10-CM | POA: Diagnosis not present

## 2018-10-22 DIAGNOSIS — R1084 Generalized abdominal pain: Secondary | ICD-10-CM | POA: Diagnosis not present

## 2018-10-22 DIAGNOSIS — R932 Abnormal findings on diagnostic imaging of liver and biliary tract: Secondary | ICD-10-CM | POA: Diagnosis not present

## 2018-10-22 DIAGNOSIS — R531 Weakness: Secondary | ICD-10-CM | POA: Diagnosis not present

## 2018-10-22 DIAGNOSIS — R112 Nausea with vomiting, unspecified: Secondary | ICD-10-CM | POA: Diagnosis not present

## 2018-10-22 DIAGNOSIS — K219 Gastro-esophageal reflux disease without esophagitis: Secondary | ICD-10-CM | POA: Diagnosis not present

## 2018-10-22 DIAGNOSIS — Z590 Homelessness: Secondary | ICD-10-CM | POA: Diagnosis not present

## 2018-10-22 DIAGNOSIS — I10 Essential (primary) hypertension: Secondary | ICD-10-CM | POA: Diagnosis not present

## 2018-10-22 DIAGNOSIS — R197 Diarrhea, unspecified: Secondary | ICD-10-CM | POA: Diagnosis not present

## 2018-10-22 DIAGNOSIS — Z800 Family history of malignant neoplasm of digestive organs: Secondary | ICD-10-CM | POA: Diagnosis not present

## 2018-10-22 DIAGNOSIS — J449 Chronic obstructive pulmonary disease, unspecified: Secondary | ICD-10-CM | POA: Diagnosis not present

## 2018-10-22 DIAGNOSIS — Z20828 Contact with and (suspected) exposure to other viral communicable diseases: Secondary | ICD-10-CM | POA: Diagnosis not present

## 2018-10-22 DIAGNOSIS — R1310 Dysphagia, unspecified: Secondary | ICD-10-CM | POA: Diagnosis not present

## 2018-10-22 DIAGNOSIS — E860 Dehydration: Secondary | ICD-10-CM | POA: Diagnosis not present

## 2018-10-22 DIAGNOSIS — R109 Unspecified abdominal pain: Secondary | ICD-10-CM | POA: Diagnosis not present

## 2018-10-22 DIAGNOSIS — K529 Noninfective gastroenteritis and colitis, unspecified: Secondary | ICD-10-CM | POA: Diagnosis not present

## 2018-11-02 DIAGNOSIS — M48061 Spinal stenosis, lumbar region without neurogenic claudication: Secondary | ICD-10-CM | POA: Diagnosis not present

## 2018-11-09 ENCOUNTER — Other Ambulatory Visit: Payer: Self-pay | Admitting: Family Medicine

## 2018-11-09 NOTE — Telephone Encounter (Signed)
Requested medication (s) are due for refill today: yes  Requested medication (s) are on the active medication list: yes  Last refill:  09/10/2018  Future visit scheduled: yes  Notes to clinic:  Review for refill  Requested Prescriptions  Pending Prescriptions Disp Refills   citalopram (CELEXA) 20 MG tablet [Pharmacy Med Name: Citalopram Hydrobromide 20 MG Oral Tablet] 30 tablet 0    Sig: Take 1 tablet by mouth once daily     There is no refill protocol information for this order

## 2018-11-09 NOTE — Telephone Encounter (Signed)
1) Medication(s) Requested (by name): gabapentin (NEURONTIN) 300 MG capsule [[id number]] ondansetron (ZOFRAN ODT) 4 MG disintegrating tablet [[id number]]  metoCLOPramide (REGLAN) 10 MG tablet [[id number]]   2) Pharmacy of Choice:  [facility], [place].   Approved medications will be sent to pharmacy, we will reach out to you if there is an issue.  Requests made after 3pm may not be addressed until following business day!

## 2018-11-09 NOTE — Telephone Encounter (Signed)
There should still be a refill on the Reglan

## 2018-11-11 ENCOUNTER — Other Ambulatory Visit: Payer: Self-pay | Admitting: Family Medicine

## 2018-11-11 MED ORDER — GABAPENTIN 300 MG PO CAPS: 300.0000 mg | ORAL_CAPSULE | Freq: Three times a day (TID) | ORAL | 0 refills | Status: DC

## 2018-11-11 MED ORDER — ONDANSETRON 4 MG PO TBDP: 4.0000 mg | ORAL_TABLET | Freq: Three times a day (TID) | ORAL | 0 refills | Status: DC | PRN

## 2018-11-15 ENCOUNTER — Other Ambulatory Visit: Payer: Self-pay | Admitting: Family Medicine

## 2018-11-15 NOTE — Telephone Encounter (Signed)
Please advise.  I did let patient know that this request normally requires an office visit for evaluation.

## 2018-11-15 NOTE — Telephone Encounter (Signed)
I have never seen patient. She may want to go to urgent care tonight regarding the need for pain medication but if she is driving then she may want to take over the counter medications such as ibuprofen, naproxen or tylenol so that she will not have side effects such as drowsiness

## 2018-11-15 NOTE — Telephone Encounter (Signed)
Patient is requesting something light for pain, says she will be driving 9[address] miles [address] for her fathers funeral and will be in pain.

## 2018-11-15 NOTE — Telephone Encounter (Signed)
*  If at all possible, send today as she will be leaving early in the morning*

## 2018-11-15 NOTE — Telephone Encounter (Signed)
Patient notified of the information below. States that she will take OTC medication.

## 2018-11-30 ENCOUNTER — Ambulatory Visit (INDEPENDENT_AMBULATORY_CARE_PROVIDER_SITE_OTHER): Payer: Medicare HMO | Admitting: Internal Medicine

## 2018-11-30 DIAGNOSIS — R159 Full incontinence of feces: Secondary | ICD-10-CM | POA: Insufficient documentation

## 2018-11-30 DIAGNOSIS — R3981 Functional urinary incontinence: Secondary | ICD-10-CM

## 2018-11-30 DIAGNOSIS — Z09 Encounter for follow-up examination after completed treatment for conditions other than malignant neoplasm: Secondary | ICD-10-CM | POA: Diagnosis not present

## 2018-11-30 DIAGNOSIS — G43109 Migraine with aura, not intractable, without status migrainosus: Secondary | ICD-10-CM

## 2018-11-30 DIAGNOSIS — K838 Other specified diseases of biliary tract: Secondary | ICD-10-CM | POA: Diagnosis not present

## 2018-11-30 DIAGNOSIS — M797 Fibromyalgia: Secondary | ICD-10-CM | POA: Insufficient documentation

## 2018-11-30 DIAGNOSIS — I2510 Atherosclerotic heart disease of native coronary artery without angina pectoris: Secondary | ICD-10-CM | POA: Insufficient documentation

## 2018-11-30 MED ORDER — TOPIRAMATE 25 MG PO TABS: 50.0000 mg | ORAL_TABLET | Freq: Every day | ORAL | 2 refills | Status: DC

## 2018-11-30 MED ORDER — PREGABALIN 50 MG PO CAPS: 50.0000 mg | ORAL_CAPSULE | Freq: Two times a day (BID) | ORAL | 2 refills | Status: DC

## 2018-11-30 NOTE — Progress Notes (Signed)
Virtual Visit via Telephone Note [facility] Due to current restrictions/limitations of in-office visits due to the COVID-19 pandemic, this scheduled clinical appointment was converted to a telehealth visit  I connected with Kelly [name] on 11/30/18 at 4:51 p.m by telephone and verified that I am speaking with the correct person using two identifiers. I am in my office.  The patient is at home.  Only the patient and myself participated in this encounter. I discussed the limitations, risks, security and privacy concerns of performing an evaluation and management service by telephone and the availability of in person appointments. I also discussed with the patient that there may be a patient responsible charge related to this service. The patient expressed understanding and agreed to proceed.   History of Present Illness: chronic pain, CAD with 5 stents placement, DM, HTN, COPD, IBS, hypothyroidism, homeless, GERD with chronic nausea, fibromyalgia, depression/anxiety, lumbar stenosis with history of 2 back surgeries.  She is wheelchair dependent.  Followed by [place] neurosurgery Dr. Venetia Constable Purpose of today's visit is hospital follow-up.  Patient last seen by NP at [facility] in June of this year.  Hosp patient hospitalized 8/21-24/2020 at [facility] with nausea, vomiting, abdominal pain and diarrhea.  She was assessed to have gastroenteritis.  Test for COVID-19 was negative.  Stool for C. difficile was negative.  LFTs normal.  CT of the abdomen revealed common bile duct and common hepatic duct dilation.  It is thought that this may be due to previous cholecystectomy.  GI recommended EUS as an outpatient to further evaluate CBD abnormality.  Also recommended that she has EGD and colonoscopy.  Patient requesting referral to gastroenterology.  She request to be changed from gabapentin to Lyrica because she feels the form is not helping anymore for her fibromyalgia.  She also has numbness in  her feet which she states has been present ever since she had her 2 back surgeries.  Requesting something for migraines.  Gives history of migraines that were diagnosed about 5 years ago.  She gets headaches almost daily that can last from 2 hours to all day.  She has aura of flashing lights prior to the start of her migraines.  Headaches are associated with nausea, photophobia and phonophobia.  She takes [place] powder 2-3 times a day.  She has never been under the care of a neurologist for her chronic headaches.  She is on Topamax 25 mg which she feels no longer helps.  She also requests prescription for pull-ups to be sent to a company called [employer] fax [phone].  She states she has worn pull-ups for years due to fecal and urinary incontinence.  She is wheelchair-bound and is unable to make it to the restroom in time.   Observations/Objective: No direct observation done as this was a telephone encounter.  Assessment and Plan:  1. Hospital discharge follow-up   2. Common bile duct dilatation - Ambulatory referral to Gastroenterology  3. 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 Will refer to neurology to help with further management - Ambulatory referral to Neurology - topiramate (TOPAMAX) 25 MG tablet; Take 2 tablets (50 mg total) by mouth daily.  Dispense: 30 tablet; Refill: 2  4. Coronary artery disease involving native coronary artery of native heart without angina pectoris - Ambulatory referral to Cardiology  5. Functional fecal incontinence 6. Functional urinary incontinence Prescription will be sent for prolapse  7. Fibromyalgia Stop gabapentin since this  does not work well for her anymore.  Changed to Lyrica. - pregabalin (LYRICA) 50 MG capsule; Take 1 capsule (50 mg total) by mouth 2 (two) times daily.  Dispense: 60 capsule; Refill: 2  Follow Up Instructions:    I discussed the assessment and  treatment plan with the patient. The patient was provided an opportunity to ask questions and all were answered. The patient agreed with the plan and demonstrated an understanding of the instructions.   The patient was advised to call back or seek an in-person evaluation if the symptoms worsen or if the condition fails to improve as anticipated.  I provided 19 minutes of non-face-to-face time during this encounter.   Jonah Blue, MD

## 2018-11-30 NOTE — Progress Notes (Signed)
Patient following up from recent hospital stay. Patient is still having abdominal pain, nausea & diarrhea. States that she's unable to vomit b/c of hiatal hernia repair but feels like she has to.  Describes the pain as a dull ache that occasionally becomes sharp. Eating & leaning to the left make the pain worse.  She also states that the Gabapentin & Topamax are no longer working.  Will be going to her local drug store to get flu shot since it's closer.

## 2018-12-02 ENCOUNTER — Encounter

## 2018-12-02 DIAGNOSIS — M48061 Spinal stenosis, lumbar region without neurogenic claudication: Secondary | ICD-10-CM | POA: Diagnosis not present

## 2018-12-08 ENCOUNTER — Other Ambulatory Visit: Payer: Self-pay

## 2018-12-08 ENCOUNTER — Encounter: Payer: Self-pay | Admitting: Cardiovascular Disease

## 2018-12-08 ENCOUNTER — Ambulatory Visit (INDEPENDENT_AMBULATORY_CARE_PROVIDER_SITE_OTHER): Payer: Medicare HMO | Admitting: Cardiovascular Disease

## 2018-12-08 VITALS — BP 133/55 | HR 59 | Temp 98.2°F | Ht 62.0 in | Wt 123.8 lb

## 2018-12-08 DIAGNOSIS — Z789 Other specified health status: Secondary | ICD-10-CM

## 2018-12-08 DIAGNOSIS — I2510 Atherosclerotic heart disease of native coronary artery without angina pectoris: Secondary | ICD-10-CM | POA: Diagnosis not present

## 2018-12-08 DIAGNOSIS — I351 Nonrheumatic aortic (valve) insufficiency: Secondary | ICD-10-CM | POA: Diagnosis not present

## 2018-12-08 DIAGNOSIS — I10 Essential (primary) hypertension: Secondary | ICD-10-CM

## 2018-12-08 DIAGNOSIS — Z720 Tobacco use: Secondary | ICD-10-CM | POA: Diagnosis not present

## 2018-12-08 DIAGNOSIS — Z01810 Encounter for preprocedural cardiovascular examination: Secondary | ICD-10-CM

## 2018-12-08 DIAGNOSIS — J449 Chronic obstructive pulmonary disease, unspecified: Secondary | ICD-10-CM | POA: Insufficient documentation

## 2018-12-08 DIAGNOSIS — E782 Mixed hyperlipidemia: Secondary | ICD-10-CM

## 2018-12-08 DIAGNOSIS — R6889 Other general symptoms and signs: Secondary | ICD-10-CM | POA: Diagnosis not present

## 2018-12-08 MED ORDER — ROSUVASTATIN CALCIUM 5 MG PO TABS: 5.0000 mg | ORAL_TABLET | Freq: Every day | ORAL | 3 refills | Status: DC

## 2018-12-08 MED ORDER — METOPROLOL SUCCINATE ER 25 MG PO TB24: 12.5000 mg | ORAL_TABLET | Freq: Every day | ORAL | 1 refills | Status: DC

## 2018-12-08 NOTE — Progress Notes (Addendum)
Cardiology Office Note:   Date:  12/08/2018  NAME:  Kelly [name]    MRN: [id number] DOB:  1955/07/19   PCP:  Bing Neighbors, FNP  Cardiologist:  No primary care provider on file.  Electrophysiologist:  None   Referring MD: Marcine Matar, MD   Chief Complaint  Patient presents with   Coronary Artery Disease   History of Present Illness:   Kelly [name] is a 63 y.o. female with a hx of CAD (status post PCI x5, PCI to mid diagonal in 2012, PCI to mid LAD and obtuse marginal in March 2017, PCI to OM1 x2 in 2018, RCA CTO, PCI to OM 1 was complicated by in-stent thrombosis and she was switched to Brilinta), hypertension, COPD, hyperlipidemia who is being seen today for the evaluation of CAD at the request of Bing Neighbors, FNP.  She reports she is not seen a cardiologist in about 2 years.  Most recently seen in November 2018, where she had PCI to an obtuse marginal x2.  It appears she represented to the hospital 2 weeks later with in-stent thrombosis, while on Plavix.  She was switched to Brilinta and has completed 1 year of therapy.  She does have a history of COPD, and has smoked roughly 1 pack/day for 50 years.  She also has a history of moderate aortic insufficiency seen on a echocardiogram in March of this year.  She also has a history of heart failure with preserved ejection fraction, but has been stable.  She reports she gets intermittent chest pain.  She states that she can get about 2 times per month episodic chest tightness that resolved with 1 nitroglycerin.  She reports this pain occurs at any time, and does not necessarily occur with activity.  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  She does not do any strenuous activity.  She is not able to climb a flight of stairs or do anything rigorous due to arthritis.  Review of labs show an A1c of 5.0, LDL 94, not on statin.  She has a strong family history of CAD.  She continues to smoke, but is not  interested in quitting at this time.  She does take an aspirin daily.  She does report she has a planned colonoscopy as well as a sedation, and is requesting clearance for that.  Past Medical History: Past Medical History:  Diagnosis Date   CAD (coronary artery disease)    5 stents in the past, 4 in [place], 1 in [facility]. 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   losartan (COZAAR) 25 MG tablet Take 1 tablet (25 mg total) by mouth daily.   Melatonin 10 MG TABS Take 10 mg by mouth at bedtime.   metoCLOPramide (REGLAN) 10 MG tablet Take 1 tablet (10 mg total) by mouth every 6 (six) hours as needed for nausea or vomiting.  nitroGLYCERIN (NITROSTAT) 0.4 MG SL tablet Place 0.4 mg under the tongue every 5 (five) minutes as needed for chest pain.   ondansetron (ZOFRAN ODT) 4 MG disintegrating tablet Take 1 tablet (4 mg total) by mouth every 8 (eight) hours as needed for nausea or vomiting.   pantoprazole (PROTONIX) 40 MG tablet Take 1 tablet (40 mg total) by mouth daily.   pregabalin (LYRICA) 50 MG capsule Take 1 capsule (50 mg total) by mouth 2 (two) times daily.   topiramate (TOPAMAX) 25 MG tablet Take 2 tablets (50 mg total) by mouth daily.     Allergies:    Morphine and related and Other   Social History: Social History   Socioeconomic History   Marital status: Divorced    Spouse name: Not on file   Number of children: Not on file    Years of education: Not on file   Highest education level: Not on file  Occupational History   Not on file  Social Needs   Financial resource strain: Not on file   Food insecurity    Worry: Not on file    Inability: Not on file   Transportation needs    Medical: Not on file    Non-medical: Not on file  Tobacco Use   Smoking status: Current Every Day Smoker    Packs/day: 1.00    Years: 50.00    Pack years: 50.00   Smokeless tobacco: Never Used  Substance and Sexual Activity   Alcohol use: Not Currently    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, clean for 30 years   Drug use: Yes    Types: Marijuana   Sexual activity: Not on file  Lifestyle   Physical activity    Days per week: Not on file    Minutes per session: Not on file   Stress: Not on file  Relationships   Social connections    Talks on phone: Not on file    Gets together: Not on file    Attends religious service: Not on file    Active member of club or organization: Not on file    Attends meetings of clubs or organizations: Not on file    Relationship status: Not on file  Other Topics Concern   Not on file  Social History Narrative   Not on file     Family History: The patient's family history includes Alzheimer's disease in her father and mother; Heart disease in her mother.  ROS:   All other ROS reviewed and negative. Pertinent positives noted in the HPI.     EKGs/Labs/Other Studies Reviewed:   The following studies were personally reviewed by me today:  EKG:  EKG was ordered today. EKG demonstrates normal sinus rhythm, left axis deviation, likely LVH, prior anteroseptal infarct, no acute ST-T changes ordered today,  and was personally reviewed by me.   TTE 05/03/2018  1. The left ventricle has normal systolic function, with an ejection fraction of 55-60%. The cavity size was normal. There is moderately increased left ventricular wall thickness. Left ventricular diastolic Doppler parameters  are consistent with  impaired relaxation.  2. The right ventricle has normal systolic function. The cavity was normal. There is no increase in right ventricular wall thickness.  3. Left atrial size was mild-moderately dilated.  4. The mitral valve is normal in structure.  5. The tricuspid valve is normal in structure.  6. The aortic valve is tricuspid Mild sclerosis of the aortic valve. Aortic valve regurgitation is  moderate by color flow Doppler.  7. The pulmonic valve was normal in structure.  Recent Labs: 05/02/2018: B Natriuretic Peptide 256.7; TSH 1.964 05/03/2018: BUN 13; Creatinine, Ser 1.15; Hemoglobin 11.8; Platelets 254; Potassium 4.0; Sodium 139   Recent Lipid Panel    Component Value Date/Time   CHOL 154 05/03/2018 0202   TRIG 161 (H) 05/03/2018 0202   HDL 28 (L) 05/03/2018 0202   CHOLHDL 5.5 05/03/2018 0202   VLDL 32 05/03/2018 0202   LDLCALC 94 05/03/2018 0202    Physical Exam:   VS:  BP (!) 133/55    Pulse (!) 59    Temp 98.2 F (36.8 C)    Ht 5\' 2"  (1.575 m)    Wt 123 lb 12.8 oz (56.2 kg)    SpO2 98%    BMI 22.64 kg/m    Wt Readings from Last 3 Encounters:  12/08/18 123 lb 12.8 oz (56.2 kg)  05/31/18 168 lb 12.8 oz (76.6 kg)  05/02/18 130 lb (59 kg)    General: Well nourished, well developed, in no acute distress Heart: Atraumatic, normal size  Eyes: PEERLA, EOMI  Neck: Supple, no JVD Endocrine: No thryomegaly Cardiac: 2 out of 6 systolic ejection murmur best heard at the left lower sternal border Lungs: Diffuse rhonchi bilaterally Abd: Soft, nontender, no hepatomegaly  Ext: No edema, pulses 2+ Musculoskeletal: No deformities, BUE and BLE strength normal and equal Skin: Warm and dry, no rashes   Neuro: Alert and oriented to person, place, time, and situation, CNII-XII grossly intact, no focal deficits  Psych: Normal mood and affect   ASSESSMENT:   NAME@ is a 63 y.o. female who presents for the following: 1. Coronary artery disease involving native coronary  artery of native heart without angina pectoris   2. Essential hypertension   3. Nonrheumatic aortic valve insufficiency   4. Preoperative cardiovascular examination   5. Mixed hyperlipidemia   6. Tobacco abuse   7. Statin intolerance     PLAN:   1. Coronary artery disease involving native coronary artery of native heart without angina pectoris - status post PCI x5 - PCI to mid diagonal in 2012 - PCI to mid LAD and obtuse marginal in March 2017 - PCI to OM1 x2 in 2018, complicated by in-stent thrombosis and she was switched to Brilinta - CTO of RCA on cath in 2018 with L to R collaterals  -She reports intermittent episodes of chest pain that occur at rest, that resolved with nitroglycerin x1.  She is not functional at all, and mainly wheelchair-bound.  She does have planned procedures coming up, and with her extensive CAD I think it is reasonable to proceed with a Lexiscan nuclear medicine myocardial perfusion imaging study to reassure her that she is safe for any planned surgeries. -She will continue aspirin 81 mg -Due to history of stent thrombosis, should she need PCI in the future she will need Brilinta -Most recent LDL around 97, and we will reinitiate Crestor 5 mg daily (she reports statin intolerance in the past), I think we could try every other day dosing after this or Zetia if she is unable to tolerate this.  She may be a candidate for a PCSK9 inhibitor pending our reinitiation of statin therapy. -We will also add back metoprolol 12.5 mg succinate once daily -She will continue sublingual nitro as needed and given strict return precautions to the emergency room if she exceeds 3 doses in a total of 15 minutes (3 to 5 minutes per dose)  2. Essential hypertension -At goal  3. Nonrheumatic aortic valve insufficiency -Aortic valve sclerosis and insufficiency seen on recent examination.  The aortic regurgitation was moderate, we will repeat an echo every 2 to 3 years.  4. Preoperative  cardiovascular examination -Her inactivity is concerning, especially with a history of 5 stents.  She does get intermittent chest pain at rest that occurs about 2 times per month that responds to one sublingual nitro.  She reports planned colonoscopy and other procedures, I think it is best to obtain a nuclear medicine stress test just to ensure there is no ischemia to further reassure her and her physician she is able to tolerate any planned procedures.  5. Mixed hyperlipidemia -Most recent LDL is not at goal, we will restart Crestor 5 mg daily.  Issues with statin intolerance as above, but will rechallenge her.  6. Tobacco abuse -Still smoking and really needs to quit, counseling provided today.  7. Statin intolerance  -She reports trying multiple statins in the past, including Crestor, Lipitor, and she is even tried ezetimibe -We will rechallenge her on low-dose Crestor 5 mg daily as she may get to a goal of less than 70 on just this dose.  We can even try every other day dosing if needed after this.  I think she would qualify for PCSK9 inhibitor if all the above fails.   Disposition: Return in about 3 months (around 03/10/2019).  Medication Adjustments/Labs and Tests Ordered: Current medicines are reviewed at length with the patient today.  Concerns regarding medicines are outlined above.  Orders Placed This Encounter  Procedures   MYOCARDIAL PERFUSION IMAGING   Meds ordered this encounter  Medications   metoprolol succinate (TOPROL-XL) 25 MG 24 hr tablet    Sig: Take 0.5 tablets (12.5 mg total) by mouth daily.    Dispense:  45 tablet    Refill:  1   rosuvastatin (CRESTOR) 5 MG tablet    Sig: Take 1 tablet (5 mg total) by mouth daily.    Dispense:  30 tablet    Refill:  3    Patient Instructions  Medication Instructions:  START Metoprolol Succinate (TOPROL-XL) 25 mg--take 1/2 tab (12.5 mg) daily.  START Rosuvastatin (Crestor) 5 mg daily.  If you need a refill on your  cardiac medications before your next appointment, please call your pharmacy.    Testing/Procedures: Your physician has requested that you have a lexiscan myoview. For further information please visit [email or web address]. Please follow instruction sheet, as given.   Follow-Up: At [facility], you and your health needs are our priority.  As part of our continuing mission to provide you with exceptional heart care, we have created designated Provider Care Teams.  These Care Teams include your primary Cardiologist (physician) and Advanced Practice Providers (APPs -  Physician Assistants and Nurse Practitioners) who all work together to provide you with the care you need, when you need it.  Please schedule a follow-up appointment to see Dr. Audie Box in 3 months.        Signed, Addison Naegeli. Audie Box, [place]  [address] [place], [place] [address] [phone]  12/08/2018 4:29 PM

## 2018-12-08 NOTE — Patient Instructions (Signed)
Medication Instructions:  START Metoprolol Succinate (TOPROL-XL) 25 mg--take 1/2 tab (12.5 mg) daily.  START Rosuvastatin (Crestor) 5 mg daily.  If you need a refill on your cardiac medications before your next appointment, please call your pharmacy.    Testing/Procedures: Your physician has requested that you have a lexiscan myoview. For further information please visit [email or web address]. Please follow instruction sheet, as given.   Follow-Up: At [facility], you and your health needs are our priority.  As part of our continuing mission to provide you with exceptional heart care, we have created designated Provider Care Teams.  These Care Teams include your primary Cardiologist (physician) and Advanced Practice Providers (APPs -  Physician Assistants and Nurse Practitioners) who all work together to provide you with the care you need, when you need it. . Please schedule a follow-up appointment to see Dr. Audie Box in 3 months.

## 2018-12-08 NOTE — Assessment & Plan Note (Signed)
PCI x5, PCI to mid diagonal in 2012, PCI to LAD and obtuse marginal in March 2017, PCI to obtuse marginal 1x2 in 2811 complicated by stent thrombosis on Plavix, was placed on Brilinta

## 2018-12-10 ENCOUNTER — Other Ambulatory Visit: Payer: Self-pay | Admitting: Internal Medicine

## 2018-12-10 NOTE — Addendum Note (Signed)
Addended by: Venetia Maxon on: 12/10/2018 04:46 PM   Modules accepted: Orders

## 2018-12-19 ENCOUNTER — Other Ambulatory Visit: Payer: Self-pay | Admitting: Internal Medicine

## 2018-12-21 ENCOUNTER — Telehealth (HOSPITAL_COMMUNITY): Payer: Self-pay

## 2018-12-21 NOTE — Telephone Encounter (Signed)
Encounter complete. 

## 2018-12-22 ENCOUNTER — Ambulatory Visit (HOSPITAL_COMMUNITY): Payer: Medicare HMO

## 2018-12-27 ENCOUNTER — Telehealth: Payer: Self-pay

## 2018-12-27 NOTE — Telephone Encounter (Signed)
Called patient to do their pre-visit COVID screening.  Have you tested positive for COVID or are you currently waiting for COVID test results? no  Have you recently traveled internationally([place], [place], [place], [place], [place]) or within the [place] to a hotspot area([place], [place], [place], [place], [place])? no  Are you currently experiencing any of the following symptoms: fever, cough, SHOB, fatigue, body aches, loss of smell/taste, rash, diarrhea, vomiting, severe headaches, weakness, sore throat? Smoker's cough, diarrhea  Have you been in contact with anyone who has recently travelled? no  Have you been in contact with anyone who is experiencing any of the above symptoms or been diagnosed with [employer]  or works in or has recently visited a SNF? no

## 2018-12-28 ENCOUNTER — Telehealth (HOSPITAL_COMMUNITY): Payer: Self-pay

## 2018-12-28 ENCOUNTER — Other Ambulatory Visit: Payer: Self-pay

## 2018-12-28 ENCOUNTER — Ambulatory Visit (INDEPENDENT_AMBULATORY_CARE_PROVIDER_SITE_OTHER): Payer: Medicare HMO | Admitting: Internal Medicine

## 2018-12-28 VITALS — BP 97/38 | HR 83 | Temp 97.5°F | Resp 17 | Wt 118.4 lb

## 2018-12-28 DIAGNOSIS — M797 Fibromyalgia: Secondary | ICD-10-CM | POA: Diagnosis not present

## 2018-12-28 DIAGNOSIS — R2689 Other abnormalities of gait and mobility: Secondary | ICD-10-CM | POA: Diagnosis not present

## 2018-12-28 DIAGNOSIS — M4120 Other idiopathic scoliosis, site unspecified: Secondary | ICD-10-CM | POA: Diagnosis not present

## 2018-12-28 DIAGNOSIS — Z9181 History of falling: Secondary | ICD-10-CM | POA: Diagnosis not present

## 2018-12-28 DIAGNOSIS — R269 Unspecified abnormalities of gait and mobility: Secondary | ICD-10-CM | POA: Insufficient documentation

## 2018-12-28 DIAGNOSIS — Z23 Encounter for immunization: Secondary | ICD-10-CM | POA: Diagnosis not present

## 2018-12-28 DIAGNOSIS — Z993 Dependence on wheelchair: Secondary | ICD-10-CM | POA: Diagnosis not present

## 2018-12-28 DIAGNOSIS — M48061 Spinal stenosis, lumbar region without neurogenic claudication: Secondary | ICD-10-CM

## 2018-12-28 MED ORDER — PREGABALIN 75 MG PO CAPS: 75.0000 mg | ORAL_CAPSULE | Freq: Two times a day (BID) | ORAL | 3 refills | Status: DC

## 2018-12-28 NOTE — Progress Notes (Signed)
Patient here for wheelchair & a gel mattress overlay for a hospital bed evaluation.  Will get flu shot today.  Patient has taken all of her medications today(except PRN nausea meds). BP is low. She denies dizziness & says that she feels fine.
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
## 2018-12-28 NOTE — Telephone Encounter (Signed)
Encounter complete. 

## 2018-12-30 ENCOUNTER — Other Ambulatory Visit: Payer: Self-pay

## 2018-12-30 ENCOUNTER — Ambulatory Visit (HOSPITAL_COMMUNITY)
Admission: RE | Admit: 2018-12-30 | Discharge: 2018-12-30 | Disposition: A | Discharge status: Home / Self Care | Payer: Medicare HMO | Source: Ambulatory Visit | Attending: Cardiology | Admitting: Cardiology

## 2018-12-30 DIAGNOSIS — I2510 Atherosclerotic heart disease of native coronary artery without angina pectoris: Secondary | ICD-10-CM | POA: Insufficient documentation

## 2018-12-30 DIAGNOSIS — E782 Mixed hyperlipidemia: Secondary | ICD-10-CM | POA: Diagnosis not present

## 2018-12-30 DIAGNOSIS — Z720 Tobacco use: Secondary | ICD-10-CM | POA: Diagnosis not present

## 2018-12-30 DIAGNOSIS — I10 Essential (primary) hypertension: Secondary | ICD-10-CM

## 2018-12-30 DIAGNOSIS — I351 Nonrheumatic aortic (valve) insufficiency: Secondary | ICD-10-CM | POA: Diagnosis not present

## 2018-12-30 DIAGNOSIS — R6889 Other general symptoms and signs: Secondary | ICD-10-CM | POA: Diagnosis not present

## 2018-12-30 DIAGNOSIS — Z01810 Encounter for preprocedural cardiovascular examination: Secondary | ICD-10-CM | POA: Insufficient documentation

## 2018-12-30 LAB — MYOCARDIAL PERFUSION IMAGING
LV dias vol: 105 mL (ref 46–106)
LV sys vol: 55 mL
Peak HR: 99 {beats}/min
Rest HR: 63 {beats}/min
SDS: 6
SRS: 4
SSS: 10
TID: 0.99

## 2018-12-30 MED ORDER — TECHNETIUM TC 99M TETROFOSMIN IV KIT
32.7000 | PACK | Freq: Once | INTRAVENOUS | Status: AC | PRN
  Administered 2018-12-30: 32.7 via INTRAVENOUS
  Filled 2018-12-30: qty 33

## 2018-12-30 MED ORDER — REGADENOSON 0.4 MG/5ML IV SOLN
0.4000 mg | Freq: Once | INTRAVENOUS | Status: AC
  Administered 2018-12-30: 0.4 mg via INTRAVENOUS

## 2018-12-30 MED ORDER — TECHNETIUM TC 99M TETROFOSMIN IV KIT
10.9000 | PACK | Freq: Once | INTRAVENOUS | Status: AC | PRN
  Administered 2018-12-30: 10.9 via INTRAVENOUS
  Filled 2018-12-30: qty 11

## 2018-12-30 MED ORDER — AMINOPHYLLINE 25 MG/ML IV SOLN
75.0000 mg | Freq: Once | INTRAVENOUS | Status: AC
  Administered 2018-12-30: 75 mg via INTRAVENOUS

## 2018-12-31 ENCOUNTER — Telehealth: Payer: Self-pay | Admitting: Cardiovascular Disease

## 2018-12-31 NOTE — Telephone Encounter (Signed)
Called Kelly [name] and let her know stress test was normal. Tolerating 5 mg crestor. Will see her in January.   [place] T. Audie Box, [place]  [address] [place], [place] [address] [phone]  9:21 AM

## 2019-01-02 DIAGNOSIS — M48061 Spinal stenosis, lumbar region without neurogenic claudication: Secondary | ICD-10-CM | POA: Diagnosis not present

## 2019-01-05 ENCOUNTER — Telehealth: Payer: Self-pay | Admitting: Cardiovascular Disease

## 2019-01-05 NOTE — Telephone Encounter (Signed)
The patient has asked that her appointment next week be cancelled and she will see Dr. Audie Box in January.     Called Mrs.Seefeldt and let her know stress test was normal. Tolerating 5 mg crestor. Will see her in January.   [place] T. Audie Box, [place]  [address], [place], [place] [address] [phone]  9:21 AM

## 2019-01-05 NOTE — Telephone Encounter (Signed)
New Message    Pt is calling about her appt She said she already got the results from her stress test and is wondering if she still needs to be seen in the office   Please call

## 2019-01-10 ENCOUNTER — Ambulatory Visit: Payer: Medicare HMO | Admitting: Cardiovascular Disease

## 2019-01-10 ENCOUNTER — Telehealth: Payer: Self-pay | Admitting: Family Medicine

## 2019-01-10 ENCOUNTER — Other Ambulatory Visit: Payer: Self-pay | Admitting: Family Medicine

## 2019-01-10 NOTE — Telephone Encounter (Signed)
Requested medication (s) are due for refill today: yes  Requested medication (s) are on the active medication list: yes  Last refill:  12/10/2018  Future visit scheduled: yes  Notes to clinic:  Review for refill   Requested Prescriptions  Pending Prescriptions Disp Refills   metoCLOPramide (REGLAN) 10 MG tablet [Pharmacy Med Name: Metoclopramide HCl 10 MG Oral Tablet] 60 tablet 0    Sig: TAKE 1 TABLET BY MOUTH EVERY 6 HOURS AS NEEDED FOR NAUSEA AND VOMITING     There is no refill protocol information for this order        

## 2019-01-10 NOTE — Telephone Encounter (Signed)
Pt called to request an update on some paperwork given to provider on 12/28/2018 for medicaid, wants an update if it has been completed and faxed over. Please follow up

## 2019-01-11 NOTE — Telephone Encounter (Signed)
Paperwork refaxed to DME supplier. Received confirmation.

## 2019-01-12 ENCOUNTER — Other Ambulatory Visit: Payer: Self-pay | Admitting: Internal Medicine

## 2019-01-13 ENCOUNTER — Telehealth: Payer: Self-pay | Admitting: Family Medicine

## 2019-01-13 MED ORDER — ONDANSETRON 4 MG PO TBDP: 4.0000 mg | ORAL_TABLET | Freq: Three times a day (TID) | ORAL | 2 refills | Status: DC | PRN

## 2019-01-13 NOTE — Telephone Encounter (Signed)
1) Medication(s) Requested (by name): -ondansetron (ZOFRAN ODT) 4 MG disintegrating tablet   2) Pharmacy of Choice: -[facility], [place].  Pt has an appointment 04/11/2018 for her 15 week follow up,  - also reports Nausea and stomach pain after having the flu shot. Requests a call back if she should be concerned about it. She states she typically feels a little sick each year after getting it. please advise.

## 2019-01-13 NOTE — Telephone Encounter (Signed)
Rx sent with refills.  Called number on file & roommate answered stating that patient was napping.

## 2019-01-17 ENCOUNTER — Ambulatory Visit: Payer: Self-pay | Admitting: Neurology

## 2019-01-18 DIAGNOSIS — R110 Nausea: Secondary | ICD-10-CM | POA: Diagnosis not present

## 2019-01-18 DIAGNOSIS — R109 Unspecified abdominal pain: Secondary | ICD-10-CM | POA: Diagnosis not present

## 2019-01-18 DIAGNOSIS — R197 Diarrhea, unspecified: Secondary | ICD-10-CM | POA: Diagnosis not present

## 2019-01-18 DIAGNOSIS — K838 Other specified diseases of biliary tract: Secondary | ICD-10-CM | POA: Diagnosis not present

## 2019-01-21 ENCOUNTER — Telehealth: Payer: Self-pay

## 2019-01-21 NOTE — Telephone Encounter (Signed)
Christ from [facility] called wanted to know if a fax for certified medical supplies and physician order was receive.

## 2019-01-24 NOTE — Telephone Encounter (Signed)
Called [facility]. They verified that they received the paper Rx for patient's DME supplies. States that they faxed a physician order for signature. When verifying fax number, realized that they faxed to [facility]. Gave them the fax number to our office & will have provider sign when they come into the office.

## 2019-01-24 NOTE — Telephone Encounter (Signed)
All [facility] paperwork has been signed & faxed back. Did receive confirmation. Will contact office.

## 2019-01-25 DIAGNOSIS — H0102B Squamous blepharitis left eye, upper and lower eyelids: Secondary | ICD-10-CM | POA: Diagnosis not present

## 2019-01-25 DIAGNOSIS — H3561 Retinal hemorrhage, right eye: Secondary | ICD-10-CM | POA: Diagnosis not present

## 2019-01-25 DIAGNOSIS — H0102A Squamous blepharitis right eye, upper and lower eyelids: Secondary | ICD-10-CM | POA: Diagnosis not present

## 2019-01-25 DIAGNOSIS — H353133 Nonexudative age-related macular degeneration, bilateral, advanced atrophic without subfoveal involvement: Secondary | ICD-10-CM | POA: Diagnosis not present

## 2019-01-25 DIAGNOSIS — H26491 Other secondary cataract, right eye: Secondary | ICD-10-CM | POA: Diagnosis not present

## 2019-01-25 DIAGNOSIS — Z961 Presence of intraocular lens: Secondary | ICD-10-CM | POA: Diagnosis not present

## 2019-01-25 DIAGNOSIS — H04123 Dry eye syndrome of bilateral lacrimal glands: Secondary | ICD-10-CM | POA: Diagnosis not present

## 2019-01-26 ENCOUNTER — Telehealth: Payer: Self-pay | Admitting: Family Medicine

## 2019-01-26 NOTE — Telephone Encounter (Signed)
error 

## 2019-01-31 ENCOUNTER — Telehealth: Payer: Self-pay | Admitting: Family Medicine

## 2019-01-31 DIAGNOSIS — R3981 Functional urinary incontinence: Secondary | ICD-10-CM | POA: Diagnosis not present

## 2019-01-31 NOTE — Telephone Encounter (Signed)
Pt called again to request an update on her gel mattress overlay and her pullups needed at [facility]. per previous note paperwork is being filled out by provider. Pt understood

## 2019-02-01 ENCOUNTER — Telehealth: Payer: Self-pay | Admitting: Family Medicine

## 2019-02-01 ENCOUNTER — Other Ambulatory Visit: Payer: Self-pay | Admitting: Family Medicine

## 2019-02-01 ENCOUNTER — Other Ambulatory Visit: Payer: Self-pay | Admitting: Internal Medicine

## 2019-02-01 DIAGNOSIS — M48061 Spinal stenosis, lumbar region without neurogenic claudication: Secondary | ICD-10-CM | POA: Diagnosis not present

## 2019-02-01 DIAGNOSIS — G43109 Migraine with aura, not intractable, without status migrainosus: Secondary | ICD-10-CM

## 2019-02-01 NOTE — Telephone Encounter (Signed)
Requested medication (s) are due for refill today: yes  Requested medication (s) are on the active medication list: yes  Last refill:  12/10/2018  Future visit scheduled: yes  Notes to clinic:  Review for refill   Requested Prescriptions  Pending Prescriptions Disp Refills   metoCLOPramide (REGLAN) 10 MG tablet [Pharmacy Med Name: Metoclopramide HCl 10 MG Oral Tablet] 60 tablet 0    Sig: TAKE 1 TABLET BY MOUTH EVERY 6 HOURS AS NEEDED FOR NAUSEA AND VOMITING     There is no refill protocol information for this order

## 2019-02-01 NOTE — Telephone Encounter (Signed)
1) Medication(s) Requested (by name): -metoCLOPramide (REGLAN) 10 MG tablet   2) Pharmacy of Choice: -[facility], [place]  3) Special Requests: -Pt would like prescription changed to twice daily as needed, states one tablet does not work and runs out early in the month since she has been taking two of them every day. Please advise

## 2019-02-01 NOTE — Telephone Encounter (Signed)
Please advise 

## 2019-02-06 MED ORDER — METOCLOPRAMIDE HCL 10 MG PO TABS: 10.0000 mg | ORAL_TABLET | Freq: Two times a day (BID) | ORAL | 1 refills | Status: DC | PRN

## 2019-02-06 NOTE — Telephone Encounter (Signed)
Reglan increased to BID PRN.

## 2019-02-10 ENCOUNTER — Other Ambulatory Visit: Payer: Self-pay | Admitting: Family Medicine

## 2019-02-10 ENCOUNTER — Other Ambulatory Visit: Payer: Self-pay | Admitting: Internal Medicine

## 2019-02-10 NOTE — Telephone Encounter (Signed)
Requested medication (s) are due for refill today: yes  Requested medication (s) are on the active medication list: yes  Last refill:  11/11/2018  Future visit scheduled: yes  Notes to clinic:  review for refill   Requested Prescriptions  Pending Prescriptions Disp Refills   levothyroxine (SYNTHROID) 25 MCG tablet [Pharmacy Med Name: Levothyroxine Sodium 25 MCG Oral Tablet] 90 tablet 0    Sig: TAKE 1 TABLET BY MOUTH ONCE DAILY BEFORE BREAKFAST      There is no refill protocol information for this order

## 2019-02-16 ENCOUNTER — Ambulatory Visit: Payer: Medicare HMO | Admitting: Neurology

## 2019-02-16 ENCOUNTER — Telehealth: Payer: Self-pay | Admitting: Family Medicine

## 2019-02-16 NOTE — Telephone Encounter (Signed)
Will forward to provider  

## 2019-02-16 NOTE — Telephone Encounter (Signed)
[facility] called to let us know that they would fax over orders from April to be filled out. The orders are for a wheelchair. They are not requesting a new wheelchair.

## 2019-02-17 NOTE — Telephone Encounter (Signed)
Spoke with Leta Jungling at [facility]. She was referring to the CMN form that was faxed on 12/22/2018, 01/05/2019 & 01/10/2019. She states that the fax number on the form was the correct number & asked that the form be scanned to her email. Form scanned to [email or web address]

## 2019-03-02 DIAGNOSIS — R3981 Functional urinary incontinence: Secondary | ICD-10-CM | POA: Diagnosis not present

## 2019-03-04 DIAGNOSIS — M48061 Spinal stenosis, lumbar region without neurogenic claudication: Secondary | ICD-10-CM | POA: Diagnosis not present

## 2019-04-01 DIAGNOSIS — R3981 Functional urinary incontinence: Secondary | ICD-10-CM | POA: Diagnosis not present

## 2019-04-04 ENCOUNTER — Ambulatory Visit: Payer: Medicare HMO | Admitting: Neurology

## 2019-04-04 DIAGNOSIS — M48061 Spinal stenosis, lumbar region without neurogenic claudication: Secondary | ICD-10-CM | POA: Diagnosis not present

## 2019-04-10 NOTE — Progress Notes (Deleted)
Cardiology Office Note:   Date:  04/10/2019  NAME:  Kelly [name]    MRN: [id number] DOB:  03-02-1956   PCP:  Bing Neighbors, FNP  Cardiologist:  No primary care provider on file.  Electrophysiologist:  None   Referring MD: Bing Neighbors, FNP   No chief complaint on file. ***  History of Present Illness:   Kelly [name] is a 64 y.o. female with a hx of CAD status post PCI x5, COPD, hypertension, hyperlipidemia who presents for follow-up.  She recently underwent nuclear medicine stress test due to concerns for upcoming procedure such as colonoscopy and surgery.  She reported intermittent chest pain and her nuclear medicine stress test was normal.  Echocardiogram shows normal LV function with moderate aortic insufficiency.  Problem List 1. CAD -PCI x 5 -LHC 01/2017 at [facility] PCI to pLAD, PCI to OM1, CTO RCA with L to R collaterals  2. Hypertension 3. COPD 4. Hyperlipidemia  -T chol 154, HDL 28, LDL 94 -A1c 5.0  Past Medical History: Past Medical History:  Diagnosis Date  . CAD (coronary artery disease)    5 stents in the past, 4 in [facility], 1 [facility] [place] [facility]. Last cath 01/2017  . CHF (congestive heart failure) (HCC)   . 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 Hyperlipidemia LDL goal <70   . Hypertension     Past Surgical History: Past Surgical History:  Procedure Laterality Date  . CARDIAC CATHETERIZATION     stents x 5    Current Medications: No outpatient medications have been marked as taking for the 04/11/19 encounter (Appointment) with O'Neal, Ronnald Ramp, MD.     Allergies:    Morphine and related and Other   Social History: Social History   Socioeconomic History  . Marital status: Divorced    Spouse name: Not on file  . Number of children: Not on file  . Years of education: Not on file  . Highest education level: Not on file  Occupational History  . Not on file  Tobacco Use    . Smoking status: Current Every Day Smoker    Packs/day: 1.00    Years: 50.00    Pack years: 50.00  . Smokeless tobacco: Never Used  Substance and Sexual Activity  . 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 Drug use: Yes    Types: Marijuana  . Sexual activity: Not on file  Other Topics Concern  . Not on file  Social History Narrative  . Not on file   Social Determinants of Health   Financial Resource Strain:   . Difficulty of Paying Living Expenses: Not on file  Food Insecurity:   . Worried About Programme researcher, broadcasting/film/video in the Last Year: Not on file  . Ran Out of Food in the Last Year: Not on file  Transportation Needs:   . Lack of Transportation (Medical): Not on file  . Lack of Transportation (Non-Medical): Not on file  Physical Activity:   . Days of Exercise per Week: Not on file  . Minutes of Exercise per Session: Not on file  Stress:   . Feeling of Stress : Not on file  Social Connections:   . Frequency of Communication with Friends and Family: Not on file  . Frequency of Social Gatherings with Friends and Family: Not on file  . Attends Religious Services:  Not on file  . Active Member of Clubs or Organizations: Not on file  . Attends Banker Meetings: Not on file  . Marital Status: Not on file     Family History: The patient's ***family history includes Alzheimer's disease in her father and mother; Heart disease in her mother.  ROS:   All other ROS reviewed and negative. Pertinent positives noted in the HPI.     EKGs/Labs/Other Studies Reviewed:   The following studies were personally reviewed by me today:  EKG:  EKG is *** ordered today.  The ekg ordered today demonstrates ***, and was personally reviewed by me.   NM Stress 12/2018  The left ventricular ejection fraction is mildly decreased (45-54%).  Nuclear stress EF: 48%.  There was no ST segment deviation noted during stress.  Defect 1: There  is a small defect of moderate severity present in the basal inferior and apex location.  This is a low risk study.   Mildly abnormal, low risk stress nuclear study with inferior and apical thinning but no ischemia.  Gated ejection fraction 48% with mild global hypokinesis.  Echo 05/03/2018 1. The left ventricle has normal systolic function, with an ejection  fraction of 55-60%. The cavity size was normal. There is moderately  increased left ventricular wall thickness. Left ventricular diastolic  Doppler parameters are consistent with  impaired relaxation.  2. The right ventricle has normal systolic function. The cavity was  normal. There is no increase in right ventricular wall thickness.  3. Left atrial size was mild-moderately dilated.  4. The mitral valve is normal in structure.  5. The tricuspid valve is normal in structure.  6. The aortic valve is tricuspid Mild sclerosis of the aortic valve.  Aortic valve regurgitation is moderate by color flow Doppler.  7. The pulmonic valve was normal in structure.   Recent Labs: 05/02/2018: B Natriuretic Peptide 256.7; TSH 1.964 05/03/2018: BUN 13; Creatinine, Ser 1.15; Hemoglobin 11.8; Platelets 254; Potassium 4.0; Sodium 139   Recent Lipid Panel    Component Value Date/Time   CHOL 154 05/03/2018 0202   TRIG 161 (H) 05/03/2018 0202   HDL 28 (L) 05/03/2018 0202   CHOLHDL 5.5 05/03/2018 0202   VLDL 32 05/03/2018 0202   LDLCALC 94 05/03/2018 0202    Physical Exam:   VS:  There were no vitals taken for this visit.   Wt Readings from Last 3 Encounters:  12/30/18 123 lb (55.8 kg)  12/28/18 118 lb 6.4 oz (53.7 kg)  12/08/18 123 lb 12.8 oz (56.2 kg)    General: Well nourished, well developed, in no acute distress Heart: Atraumatic, normal size  Eyes: PEERLA, EOMI  Neck: Supple, no JVD Endocrine: No thryomegaly Cardiac: Normal S1, S2; RRR; no murmurs, rubs, or gallops Lungs: Clear to auscultation bilaterally, no wheezing, rhonchi or  rales  Abd: Soft, nontender, no hepatomegaly  Ext: No edema, pulses 2+ Musculoskeletal: No deformities, BUE and BLE strength normal and equal Skin: Warm and dry, no rashes   Neuro: Alert and oriented to person, place, time, and situation, CNII-XII grossly intact, no focal deficits  Psych: Normal mood and affect   ASSESSMENT:   Kelly [name] is a 64 y.o. female who presents for the following: 1. Coronary artery disease involving native coronary artery of native heart without angina pectoris   2. Mixed hyperlipidemia   3. Essential hypertension   4. Nonrheumatic aortic valve insufficiency     PLAN:   There are no diagnoses linked to  this encounter.  1. Coronary artery disease involving native coronary artery of native heart without angina pectoris - status post PCI x5 - PCI to mid diagonal in 2012 - PCI to mid LAD and obtuse marginal in March 2017 - PCI to [place] x2 in 7989, complicated by in-stent thrombosis and she was switched to Brilinta - CTO of RCA on cath in 2018 with L to R collaterals  -She reports intermittent episodes of chest pain that occur at rest, that resolved with nitroglycerin x1.  She is not functional at all, and mainly wheelchair-bound.  She does have planned procedures coming up, and with her extensive CAD I think it is reasonable to proceed with a [place] nuclear medicine myocardial perfusion imaging study to reassure her that she is safe for any planned surgeries. -She will continue aspirin 81 mg -Due to history of stent thrombosis, should she need PCI in the future she will need Brilinta -Most recent LDL around 97, and we will reinitiate Crestor 5 mg daily (she reports statin intolerance in the past), I think we could try every other day dosing after this or Zetia if she is unable to tolerate this.  She may be a candidate for a PCSK9 inhibitor pending our reinitiation of statin therapy. -We will also add back metoprolol 12.5 mg succinate once daily -She will continue  sublingual nitro as needed and given strict return precautions to the emergency room if she exceeds 3 doses in a total of 15 minutes (3 to 5 minutes per dose)  2. Essential hypertension -At goal  3. Nonrheumatic aortic valve insufficiency -Aortic valve sclerosis and insufficiency seen on recent examination.  The aortic regurgitation was moderate, we will repeat an echo every 2 to 3 years.  4. Preoperative cardiovascular examination -Her inactivity is concerning, especially with a history of 5 stents.  She does get intermittent chest pain at rest that occurs about 2 times per month that responds to one sublingual nitro.  She reports planned colonoscopy and other procedures, I think it is best to obtain a nuclear medicine stress test just to ensure there is no ischemia to further reassure her and her physician she is able to tolerate any planned procedures.  5. Mixed hyperlipidemia -Most recent LDL is not at goal, we will restart Crestor 5 mg daily.  Issues with statin intolerance as above, but will rechallenge her.  Disposition: No follow-ups on file.  Medication Adjustments/Labs and Tests Ordered: Current medicines are reviewed at length with the patient today.  Concerns regarding medicines are outlined above.  No orders of the defined types were placed in this encounter.  No orders of the defined types were placed in this encounter.   There are no Patient Instructions on file for this visit.   Signed, Addison Naegeli. Audie Box, [place]  [address], [place], [place] [address] [phone]  04/10/2019 3:13 PM

## 2019-04-11 ENCOUNTER — Ambulatory Visit: Payer: Medicare HMO | Admitting: Cardiovascular Disease

## 2019-04-12 ENCOUNTER — Ambulatory Visit: Payer: Medicare HMO | Admitting: Internal Medicine

## 2019-04-14 ENCOUNTER — Ambulatory Visit: Payer: Medicare HMO | Admitting: Internal Medicine

## 2019-04-24 NOTE — Progress Notes (Signed)
Virtual Visit via Telephone Note   This visit type was conducted due to national recommendations for restrictions regarding the COVID-19 Pandemic (e.g. social distancing) in an effort to limit this patient's exposure and mitigate transmission in our community.  Due to her co-morbid illnesses, this patient is at least at moderate risk for complications without adequate follow up.  This format is felt to be most appropriate for this patient at this time.  The patient did not have access to video technology/had technical difficulties with video requiring transitioning to audio format only (telephone).  All issues noted in this document were discussed and addressed.  No physical exam could be performed with this format.  Please refer to the patient's chart for her  consent to telehealth for [facility].   Date:  04/26/2019   ID:  [place], [place] [birth year], MRN [id number]  Patient Location: Home Provider Location: Home  PCP:  Bing Neighbors, FNP  Cardiologist:  Reatha Harps, MD   Evaluation Performed:  Follow-Up Visit  Chief Complaint:  CAD  History of Present Illness:    Kelly [name] is a 64 y.o. female with CAD, hypertension, COPD who presents for follow-up of CAD. She reports she is inactive and only gets up to go to the bathroom. She has no CP or SOB. Tolerating crestor 5 mg well. No cramps or aches reported. She reports she is not leaving her house for fear of covid. She seems to be doing well. Still smoking 1/2 ppd. Wants to quit but has not made plan yet. Recent lipid profile below. Needs re-check. On aspirin without issues. We did her stress test in anticipation for colonoscopy. Has not completed this yet.   Lipid profile 05/03/2018: Total cholesterol 154, HDL 28, LDL 94, triglycerides 161  Problem List 1. CAD -status post PCI x5 -PCI to mid diagonal in 2012 -PCI to mid LAD and obtuse marginal in March 2017 -PCI to OM1 x2 in 2018 -RCA CTO with L to R collaterals  -PCI to OM  1 was complicated by in-stent thrombosis and she was switched to Brilinta - MPI 12/30/2018 2. HTN 3. COPD  The patient does not have symptoms concerning for COVID-19 infection (fever, chills, cough, or new shortness of breath).    Past Medical History:  Diagnosis Date  . CAD (coronary artery disease)    5 stents in the past, 4 in [facility], 1 in [facility]. Last cath 01/2017  . CHF (congestive heart failure) (HCC)   . 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 Hyperlipidemia LDL goal <70   . Hypertension    Past Surgical History:  Procedure Laterality Date  . CARDIAC CATHETERIZATION     stents x 5     Current Meds  Medication Sig  . citalopram (CELEXA) 20 MG tablet Take 1 tablet by mouth once daily  . levothyroxine (SYNTHROID) 25 MCG tablet TAKE 1 TABLET BY MOUTH ONCE DAILY BEFORE BREAKFAST  . losartan (COZAAR) 25 MG tablet Take 1 tablet (25 mg total) by mouth daily.  . metoCLOPramide (REGLAN) 10 MG tablet Take 1 tablet (10 mg total) by mouth 2 (two) times daily as needed for nausea.  . metoprolol succinate (TOPROL-XL) 25 MG 24 hr tablet Take 0.5 tablets (12.5 mg total) by mouth daily.  . nitroGLYCERIN (NITROSTAT) 0.4 MG SL tablet Place 0.4 mg under the tongue every 5 (five) minutes as needed for chest pain.  Marland Kitchen ondansetron (  ZOFRAN ODT) 4 MG disintegrating tablet Take 1 tablet (4 mg total) by mouth every 8 (eight) hours as needed for nausea or vomiting.  . pantoprazole (PROTONIX) 40 MG tablet Take 1 tablet (40 mg total) by mouth daily.  . pregabalin (LYRICA) 75 MG capsule Take 1 capsule (75 mg total) by mouth 2 (two) times daily.  . rosuvastatin (CRESTOR) 5 MG tablet Take 1 tablet (5 mg total) by mouth daily.  Marland Kitchen topiramate (TOPAMAX) 25 MG tablet Take 2 tablets by mouth once daily  . [DISCONTINUED] Melatonin 10 MG TABS Take 10 mg by mouth at bedtime.     Allergies:   Morphine and related and Other   Social  History   Tobacco Use  . Smoking status: Current Every Day Smoker    Packs/day: 1.00    Years: 50.00    Pack years: 50.00  . Smokeless tobacco: Never Used  Substance Use Topics  . 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 Drug use: Yes    Types: Marijuana     Family Hx: The patient's family history includes Alzheimer's disease in her father and mother; Heart disease in her mother.  ROS:   Please see the history of present illness.     All other systems reviewed and are negative.   Prior CV studies:   The following studies were reviewed today:  NM Jan 09, 2019 Mildly abnormal, low risk stress nuclear study with inferior and apical thinning but no ischemia.  Gated ejection fraction 48% with mild global hypokinesis.  TTE 05/03/2018 1. The left ventricle has normal systolic function, with an ejection  fraction of 55-60%. The cavity size was normal. There is moderately  increased left ventricular wall thickness. Left ventricular diastolic  Doppler parameters are consistent with  impaired relaxation.  2. The right ventricle has normal systolic function. The cavity was  normal. There is no increase in right ventricular wall thickness.  3. Left atrial size was mild-moderately dilated.  4. The mitral valve is normal in structure.  5. The tricuspid valve is normal in structure.  6. The aortic valve is tricuspid Mild sclerosis of the aortic valve.  Aortic valve regurgitation is moderate by color flow Doppler.  7. The pulmonic valve was normal in structure.     Labs/Other Tests and Data Reviewed:    EKG:  No ECG reviewed.  Recent Labs: 05/02/2018: B Natriuretic Peptide 256.7; TSH 1.964 05/03/2018: BUN 13; Creatinine, Ser 1.15; Hemoglobin 11.8; Platelets 254; Potassium 4.0; Sodium 139   Recent Lipid Panel Lab Results  Component Value Date/Time   CHOL 154 05/03/2018 02:02 AM   TRIG 161 (H) 05/03/2018 02:02 AM   HDL 28 (L)  05/03/2018 02:02 AM   CHOLHDL 5.5 05/03/2018 02:02 AM   LDLCALC 94 05/03/2018 02:02 AM    Wt Readings from Last 3 Encounters:  Jan 09, 2019 123 lb (55.8 kg)  12/28/18 118 lb 6.4 oz (53.7 kg)  12/08/18 123 lb 12.8 oz (56.2 kg)     Objective:    Vital Signs:  BP (!) 141/57   Pulse (!) 52   Temp 98.1 F (36.7 C)   Ht 5\' 2"  (1.575 m)   SpO2 98%   BMI 22.50 kg/m    VITAL SIGNS:  reviewed  Gen: NAD Pulm: talking full sentences  Psych: normal mood   ASSESSMENT & PLAN:    1. Coronary artery disease involving native coronary artery of native heart without angina pectoris -  extensive CAD history as above -recent NM MPI normal -no angina -continue ASA 81 mg QD -continue metoprolol  -need to recheck lipid profile now that she is tolerating statin   2. Essential hypertension -acceptable   3. Mixed hyperlipidemia -had issues with statin intolerance -ok on crestor 5 mg QHS -will re-check lipid panel at next appointment with me. She is not wanting to get out right now due to covid  4. Tobacco abuse -smoking cessation counseling provided  5. Aortic Regurgitation  -moderate on recent TTE -repeat TTE 2022   COVID-19 Education: The signs and symptoms of COVID-19 were discussed with the patient and how to seek care for testing (follow up with PCP or arrange E-visit).  The importance of social distancing was discussed today.  Time:   Today, I have spent 35 minutes with the patient with telehealth technology discussing the above problems.     Medication Adjustments/Labs and Tests Ordered: Current medicines are reviewed at length with the patient today.  Concerns regarding medicines are outlined above.   Tests Ordered: No orders of the defined types were placed in this encounter.   Medication Changes: No orders of the defined types were placed in this encounter.   Follow Up:  In Person in 6 month(s)  Signed, Reatha Harps, MD  04/26/2019 2:15 PM    [facility]

## 2019-04-26 ENCOUNTER — Telehealth (INDEPENDENT_AMBULATORY_CARE_PROVIDER_SITE_OTHER): Payer: Medicare HMO | Admitting: Cardiovascular Disease

## 2019-04-26 ENCOUNTER — Encounter: Payer: Self-pay | Admitting: Cardiovascular Disease

## 2019-04-26 VITALS — BP 141/57 | HR 52 | Temp 98.1°F | Ht 62.0 in

## 2019-04-26 DIAGNOSIS — E782 Mixed hyperlipidemia: Secondary | ICD-10-CM | POA: Diagnosis not present

## 2019-04-26 DIAGNOSIS — I2510 Atherosclerotic heart disease of native coronary artery without angina pectoris: Secondary | ICD-10-CM | POA: Diagnosis not present

## 2019-04-26 DIAGNOSIS — Z720 Tobacco use: Secondary | ICD-10-CM | POA: Diagnosis not present

## 2019-04-26 DIAGNOSIS — I351 Nonrheumatic aortic (valve) insufficiency: Secondary | ICD-10-CM

## 2019-04-26 DIAGNOSIS — I10 Essential (primary) hypertension: Secondary | ICD-10-CM | POA: Diagnosis not present

## 2019-04-26 NOTE — Patient Instructions (Signed)
Medication Instructions:  The current medical regimen is effective;  continue present plan and medications.  *If you need a refill on your cardiac medications before your next appointment, please call your pharmacy*  Lab Work: Lipid (please come fasting the day of your 6 month appointment for labs) If you have labs (blood work) drawn today and your tests are completely normal, you will receive your results only by: Marland Kitchen MyChart Message (if you have MyChart) OR . A paper copy in the mail If you have any lab test that is abnormal or we need to change your treatment, we will call you to review the results.  Follow-Up: At [facility], you and your health needs are our priority.  As part of our continuing mission to provide you with exceptional heart care, we have created designated Provider Care Teams.  These Care Teams include your primary Cardiologist (physician) and Advanced Practice Providers (APPs -  Physician Assistants and Nurse Practitioners) who all work together to provide you with the care you need, when you need it.  Your next appointment:   6 month(s) morning appointment (should have fasting labs completed when coming to this appointment)  The format for your next appointment:   In Person  Provider:   Lennie Odor, MD

## 2019-04-26 NOTE — Addendum Note (Signed)
Addended by: Darene Lamer T on: 04/26/2019 02:21 PM   Modules accepted: Orders

## 2019-04-27 ENCOUNTER — Other Ambulatory Visit: Payer: Self-pay | Admitting: Internal Medicine

## 2019-04-27 DIAGNOSIS — M797 Fibromyalgia: Secondary | ICD-10-CM

## 2019-04-29 ENCOUNTER — Other Ambulatory Visit: Payer: Self-pay | Admitting: Internal Medicine

## 2019-05-01 DIAGNOSIS — R3981 Functional urinary incontinence: Secondary | ICD-10-CM | POA: Diagnosis not present

## 2019-05-02 ENCOUNTER — Encounter: Payer: Self-pay | Admitting: Internal Medicine

## 2019-05-02 ENCOUNTER — Ambulatory Visit (INDEPENDENT_AMBULATORY_CARE_PROVIDER_SITE_OTHER): Payer: Medicare HMO | Admitting: Internal Medicine

## 2019-05-02 ENCOUNTER — Telehealth: Payer: Self-pay | Admitting: Family Medicine

## 2019-05-02 VITALS — BP 156/55 | HR 56 | Temp 97.9°F | Resp 17

## 2019-05-02 DIAGNOSIS — R110 Nausea: Secondary | ICD-10-CM | POA: Diagnosis not present

## 2019-05-02 DIAGNOSIS — M48061 Spinal stenosis, lumbar region without neurogenic claudication: Secondary | ICD-10-CM | POA: Diagnosis not present

## 2019-05-02 DIAGNOSIS — I10 Essential (primary) hypertension: Secondary | ICD-10-CM

## 2019-05-02 DIAGNOSIS — M797 Fibromyalgia: Secondary | ICD-10-CM | POA: Diagnosis not present

## 2019-05-02 MED ORDER — METOCLOPRAMIDE HCL 10 MG PO TABS: 10.0000 mg | ORAL_TABLET | Freq: Two times a day (BID) | ORAL | 2 refills | Status: DC | PRN

## 2019-05-02 MED ORDER — LOSARTAN POTASSIUM 25 MG PO TABS: 25.0000 mg | ORAL_TABLET | Freq: Every day | ORAL | 0 refills | Status: DC

## 2019-05-02 MED ORDER — PREGABALIN 75 MG PO CAPS: 75.0000 mg | ORAL_CAPSULE | Freq: Two times a day (BID) | ORAL | 2 refills | Status: DC

## 2019-05-02 MED ORDER — ONDANSETRON 4 MG PO TBDP: 4.0000 mg | ORAL_TABLET | Freq: Three times a day (TID) | ORAL | 2 refills | Status: DC | PRN

## 2019-05-02 NOTE — Telephone Encounter (Signed)
1) Medication(s) Requested (by name): ondansetron (ZOFRAN ODT) 4 MG disintegrating tablet [[id number]]  pregabalin (LYRICA) 75 MG capsule [[id number]]   2) Pharmacy of Choice: [facility] [place], [place] - [address]  [address] [place] [address]     Approved medications will be sent to pharmacy, we will reach out to you if there is an issue.  Requests made after 3pm may not be addressed until following business day!

## 2019-05-02 NOTE — Progress Notes (Signed)
Virtual Visit via Telephone Note  I connected with Kelly [name], on 05/02/2019 at 1:37 PM by telephone due to the COVID-19 pandemic and verified that I am speaking with the correct person using two identifiers.   Consent: I discussed the limitations, risks, security and privacy concerns of performing an evaluation and management service by telephone and the availability of in person appointments. I also discussed with the patient that there may be a patient responsible charge related to this service. The patient expressed understanding and agreed to proceed.   Location of Patient: Home    Location of Provider: Clinic   Persons participating in Telemedicine visit: Doralene Glanz [facility] Dr. Juleen China      History of Present Illness: Patient has a visit for follow up. She reports that she is having issues with her nausea again. Has been bad for about 3 weeks. She reports she had a phone visit with the GI specialist in town. She is from somewhere near [place] and can't remember the name of the doctor she saw there. She has not seen GI here in town because she thought they wouldn't want to see her if they didn't have prior records.    Past Medical History:  Diagnosis Date  . CAD (coronary artery disease)    5 stents in the past, 4 in [place] [facility], 1 in [place] [facility]. Last cath 01/2017  . CHF (congestive heart failure) ([employer])   . 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 Hyperlipidemia LDL goal <70   . Hypertension    Allergies  Allergen Reactions  . Morphine And Related Nausea And Vomiting    Severe nausea and dry heaves  . Other Other (See Comments)    "Many antibiotics cause severe yeast infections" (can tolerate Z-Paks)    Current Outpatient Medications on File Prior to Visit  Medication Sig Dispense Refill  . citalopram (CELEXA) 20 MG tablet Take 1 tablet by mouth once daily 90 tablet 0  . levothyroxine  (SYNTHROID) 25 MCG tablet TAKE 1 TABLET BY MOUTH ONCE DAILY BEFORE BREAKFAST 90 tablet 1  . losartan (COZAAR) 25 MG tablet Take 1 tablet (25 mg total) by mouth daily. 90 tablet 1  . metoCLOPramide (REGLAN) 10 MG tablet Take 1 tablet (10 mg total) by mouth 2 (two) times daily as needed for nausea. 60 tablet 1  . metoprolol succinate (TOPROL-XL) 25 MG 24 hr tablet Take 0.5 tablets (12.5 mg total) by mouth daily. 45 tablet 1  . nitroGLYCERIN (NITROSTAT) 0.4 MG SL tablet Place 0.4 mg under the tongue every 5 (five) minutes as needed for chest pain.    Marland Kitchen ondansetron (ZOFRAN ODT) 4 MG disintegrating tablet Take 1 tablet (4 mg total) by mouth every 8 (eight) hours as needed for nausea or vomiting. 20 tablet 2  . pantoprazole (PROTONIX) 40 MG tablet Take 1 tablet (40 mg total) by mouth daily. 90 tablet 3  . pregabalin (LYRICA) 75 MG capsule Take 1 capsule (75 mg total) by mouth 2 (two) times daily. 60 capsule 3  . rosuvastatin (CRESTOR) 5 MG tablet Take 1 tablet (5 mg total) by mouth daily. 30 tablet 3  . topiramate (TOPAMAX) 25 MG tablet Take 2 tablets by mouth once daily 60 tablet 5   No current facility-administered medications on file prior to visit.    Observations/Objective: NAD. Speaking clearly.  Work of breathing normal.  Alert and oriented. Mood appropriate.   Assessment and Plan:  1. Fibromyalgia - pregabalin (LYRICA) 75 MG capsule; Take 1 capsule (75 mg total) by mouth 2 (two) times daily.  Dispense: 60 capsule; Refill: 2  2. Chronic nausea Thought to be related to common bile duct dilation s/p cholecystectomy. Encouraged patient to call GI and schedule f/u visit despite not remembering name of prior GI specialist for record release.  - metoCLOPramide (REGLAN) 10 MG tablet; Take 1 tablet (10 mg total) by mouth 2 (two) times daily as needed for nausea.  Dispense: 60 tablet; Refill: 2 - ondansetron (ZOFRAN ODT) 4 MG disintegrating tablet; Take 1 tablet (4 mg total) by mouth every 8 (eight)  hours as needed for nausea or vomiting.  Dispense: 30 tablet; Refill: 2  3. Essential hypertension - losartan (COZAAR) 25 MG tablet; Take 1 tablet (25 mg total) by mouth daily.  Dispense: 90 tablet; Refill: 0   Follow Up Instructions: For chronic medical problems in 3 months    I discussed the assessment and treatment plan with the patient. The patient was provided an opportunity to ask questions and all were answered. The patient agreed with the plan and demonstrated an understanding of the instructions.   The patient was advised to call back or seek an in-person evaluation if the symptoms worsen or if the condition fails to improve as anticipated.     I provided 14 minutes total of non-face-to-face time during this encounter including median intraservice time, reviewing previous notes, investigations, ordering medications, medical decision making, coordinating care and patient verbalized understanding at the end of the visit.    Marcy Siren, D.O. Primary Care at [facility]  05/02/2019, 1:37 PM

## 2019-05-02 NOTE — Telephone Encounter (Signed)
Patient has televisit scheduled at 1:30 PM on 05/02/2019. This will be addressed then.

## 2019-05-19 ENCOUNTER — Other Ambulatory Visit: Payer: Self-pay | Admitting: Internal Medicine

## 2019-05-31 DIAGNOSIS — R3981 Functional urinary incontinence: Secondary | ICD-10-CM | POA: Diagnosis not present

## 2019-06-02 DIAGNOSIS — M48061 Spinal stenosis, lumbar region without neurogenic claudication: Secondary | ICD-10-CM | POA: Diagnosis not present

## 2019-06-17 ENCOUNTER — Telehealth: Payer: Self-pay | Admitting: Internal Medicine

## 2019-06-17 NOTE — Telephone Encounter (Signed)
Patient called [facility] office trying to get in touch with her pcp. Patient has been unable to reach her office. Patient stated that she wanted an update on her wheelchair and her gel mattress over lay. Please follow up at your earliest convenience.

## 2019-06-17 NOTE — Telephone Encounter (Signed)
Paperwork has been faxed & scanned to email multiple times. Forms & Rx was given to front desk to scan. I do not see paperwork in patient's chart.

## 2019-06-20 ENCOUNTER — Other Ambulatory Visit: Payer: Self-pay | Admitting: Family Medicine

## 2019-06-20 NOTE — Telephone Encounter (Signed)
Routing to Dr Philis Pique office- [facility] does not fill prescriptions for this practice. Requested Prescriptions  Pending Prescriptions Disp Refills  . pantoprazole (PROTONIX) 40 MG tablet [Pharmacy Med Name: Pantoprazole Sodium 40 MG Oral Tablet Delayed Release] 90 tablet 0    Sig: Take 1 tablet by mouth once daily     There is no refill protocol information for this order

## 2019-06-21 ENCOUNTER — Other Ambulatory Visit: Payer: Self-pay | Admitting: Family Medicine

## 2019-06-30 DIAGNOSIS — R3981 Functional urinary incontinence: Secondary | ICD-10-CM | POA: Diagnosis not present

## 2019-07-30 ENCOUNTER — Other Ambulatory Visit: Payer: Self-pay | Admitting: Internal Medicine

## 2019-07-30 DIAGNOSIS — G43109 Migraine with aura, not intractable, without status migrainosus: Secondary | ICD-10-CM

## 2019-07-30 DIAGNOSIS — R3981 Functional urinary incontinence: Secondary | ICD-10-CM | POA: Diagnosis not present

## 2019-08-04 ENCOUNTER — Other Ambulatory Visit: Payer: Self-pay

## 2019-08-04 DIAGNOSIS — M797 Fibromyalgia: Secondary | ICD-10-CM

## 2019-08-04 MED ORDER — PREGABALIN 75 MG PO CAPS: 75.0000 mg | ORAL_CAPSULE | Freq: Two times a day (BID) | ORAL | 2 refills | Status: DC

## 2019-08-17 ENCOUNTER — Other Ambulatory Visit: Payer: Self-pay | Admitting: Internal Medicine

## 2019-08-19 ENCOUNTER — Other Ambulatory Visit: Payer: Self-pay

## 2019-08-19 DIAGNOSIS — I10 Essential (primary) hypertension: Secondary | ICD-10-CM

## 2019-08-19 MED ORDER — LOSARTAN POTASSIUM 25 MG PO TABS: 25.0000 mg | ORAL_TABLET | Freq: Every day | ORAL | 0 refills | Status: DC

## 2019-08-19 MED ORDER — CITALOPRAM HYDROBROMIDE 20 MG PO TABS: 20.0000 mg | ORAL_TABLET | Freq: Every day | ORAL | 0 refills | Status: DC

## 2019-08-23 ENCOUNTER — Telehealth (INDEPENDENT_AMBULATORY_CARE_PROVIDER_SITE_OTHER): Payer: Medicare HMO | Admitting: Internal Medicine

## 2019-08-23 ENCOUNTER — Encounter: Payer: Self-pay | Admitting: Internal Medicine

## 2019-08-23 DIAGNOSIS — I10 Essential (primary) hypertension: Secondary | ICD-10-CM

## 2019-08-23 DIAGNOSIS — F32A Depression, unspecified: Secondary | ICD-10-CM

## 2019-08-23 DIAGNOSIS — M25562 Pain in left knee: Secondary | ICD-10-CM | POA: Diagnosis not present

## 2019-08-23 DIAGNOSIS — R110 Nausea: Secondary | ICD-10-CM | POA: Diagnosis not present

## 2019-08-23 DIAGNOSIS — G8929 Other chronic pain: Secondary | ICD-10-CM | POA: Diagnosis not present

## 2019-08-23 DIAGNOSIS — F329 Major depressive disorder, single episode, unspecified: Secondary | ICD-10-CM | POA: Diagnosis not present

## 2019-08-23 DIAGNOSIS — G43109 Migraine with aura, not intractable, without status migrainosus: Secondary | ICD-10-CM

## 2019-08-23 MED ORDER — LOSARTAN POTASSIUM 25 MG PO TABS: 25.0000 mg | ORAL_TABLET | Freq: Every day | ORAL | 0 refills | Status: DC

## 2019-08-23 MED ORDER — METOPROLOL SUCCINATE ER 25 MG PO TB24: 12.5000 mg | ORAL_TABLET | Freq: Every day | ORAL | 1 refills | Status: DC

## 2019-08-23 MED ORDER — METOCLOPRAMIDE HCL 10 MG PO TABS: 10.0000 mg | ORAL_TABLET | Freq: Two times a day (BID) | ORAL | 2 refills | Status: DC | PRN

## 2019-08-23 MED ORDER — CITALOPRAM HYDROBROMIDE 20 MG PO TABS: 20.0000 mg | ORAL_TABLET | Freq: Every day | ORAL | 0 refills | Status: DC

## 2019-08-23 MED ORDER — PANTOPRAZOLE SODIUM 40 MG PO TBEC: 40.0000 mg | DELAYED_RELEASE_TABLET | Freq: Every day | ORAL | 0 refills | Status: DC

## 2019-08-23 MED ORDER — DICLOFENAC SODIUM 1 % EX GEL: 4.0000 g | Freq: Four times a day (QID) | CUTANEOUS | 1 refills | Status: DC

## 2019-08-23 MED ORDER — LEVOTHYROXINE SODIUM 25 MCG PO TABS: ORAL_TABLET | ORAL | 0 refills | Status: DC

## 2019-08-23 MED ORDER — ROSUVASTATIN CALCIUM 5 MG PO TABS: 5.0000 mg | ORAL_TABLET | Freq: Every day | ORAL | 3 refills | Status: DC

## 2019-08-23 MED ORDER — TOPIRAMATE 25 MG PO TABS: 50.0000 mg | ORAL_TABLET | Freq: Every day | ORAL | 0 refills | Status: DC

## 2019-08-23 NOTE — Progress Notes (Signed)
Virtual Visit via Telephone Note  I connected with Kelly [name], on 08/23/2019 at 8:40 AM by telephone due to the COVID-19 pandemic and verified that I am speaking with the correct person using two identifiers.   Consent: I discussed the limitations, risks, security and privacy concerns of performing an evaluation and management service by telephone and the availability of in person appointments. I also discussed with the patient that there may be a patient responsible charge related to this service. The patient expressed understanding and agreed to proceed.   Location of Patient: Home   Location of Provider: Clinic    Persons participating in Telemedicine visit: Marvene Strohm [facility] Dr. Earlene Plater    History of Present Illness: Patient has a visit for concern about knee swelling and knee pain, left sided. Reports that it will swell up and then go down. Pain is deep inside the knee and also at the top of the patella bone where meets the thigh. "Acting like arthritis" per patient. Heat will improve the swelling. Has been putting OTC pain patches across the top of the knee and it helps to aleve some of the pain. No calf edema or pain. Afebrile. No chills or vomiting.   Patient reports she is home bound and has been on second floor for >8 months. She is afraid to try to go down the stairs.    Past Medical History:  Diagnosis Date  . CAD (coronary artery disease)    5 stents in the past, 4 in [facility], 1 in [facility]. Last cath 01/2017  . CHF (congestive heart failure) (HCC)   . 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 Hyperlipidemia LDL goal <70   . Hypertension    Allergies  Allergen Reactions  . Morphine And Related Nausea And Vomiting    Severe nausea and dry heaves  . Other Other (See Comments)    "Many antibiotics cause severe yeast infections" (can tolerate Z-Paks)    Current Outpatient  Medications on File Prior to Visit  Medication Sig Dispense Refill  . citalopram (CELEXA) 20 MG tablet Take 1 tablet (20 mg total) by mouth daily. 90 tablet 0  . EUTHYROX 25 MCG tablet TAKE 1 TABLET BY MOUTH ONCE DAILY BEFORE BREAKFAST 90 tablet 0  . losartan (COZAAR) 25 MG tablet Take 1 tablet (25 mg total) by mouth daily. 90 tablet 0  . metoCLOPramide (REGLAN) 10 MG tablet Take 1 tablet (10 mg total) by mouth 2 (two) times daily as needed for nausea. 60 tablet 2  . metoprolol succinate (TOPROL-XL) 25 MG 24 hr tablet Take 0.5 tablets (12.5 mg total) by mouth daily. 45 tablet 1  . nitroGLYCERIN (NITROSTAT) 0.4 MG SL tablet Place 0.4 mg under the tongue every 5 (five) minutes as needed for chest pain.    Marland Kitchen ondansetron (ZOFRAN ODT) 4 MG disintegrating tablet Take 1 tablet (4 mg total) by mouth every 8 (eight) hours as needed for nausea or vomiting. 30 tablet 2  . pantoprazole (PROTONIX) 40 MG tablet Take 1 tablet by mouth once daily 90 tablet 0  . pregabalin (LYRICA) 75 MG capsule Take 1 capsule (75 mg total) by mouth 2 (two) times daily. 60 capsule 2  . rosuvastatin (CRESTOR) 5 MG tablet Take 1 tablet (5 mg total) by mouth daily. 30 tablet 3  . topiramate (TOPAMAX) 25 MG tablet Take 2 tablets by mouth once daily 60 tablet 0   No current  facility-administered medications on file prior to visit.    Observations/Objective: NAD. Speaking clearly.  Work of breathing normal.  Alert and oriented. Mood appropriate.   Assessment and Plan: 1. Chronic pain of left knee Given the chronic waxing and waning nature, sounds most consistent with knee OA. No prior imaging on file. Patient is unable to leave the home to obtain physical exam or further work up. Unfortunately, do not think she would be best candidate for NSAIDs due to reduced renal function and CAD. Offered home health PT and patient declined. Suspect corticosteroid injection would have potential benefit, but again unable to see patient in person.  Recommended trying Voltaren gel for pain relief and Tylenol OTC.  - diclofenac Sodium (VOLTAREN) 1 % GEL; Apply 4 g topically 4 (four) times daily.  Dispense: 350 g; Refill: 1  2. Essential hypertension - losartan (COZAAR) 25 MG tablet; Take 1 tablet (25 mg total) by mouth daily.  Dispense: 90 tablet; Refill: 0 - metoprolol succinate (TOPROL-XL) 25 MG 24 hr tablet; Take 0.5 tablets (12.5 mg total) by mouth daily.  Dispense: 45 tablet; Refill: 1  3. Migraine with aura and without status migrainosus, not intractable - topiramate (TOPAMAX) 25 MG tablet; Take 2 tablets (50 mg total) by mouth daily.  Dispense: 60 tablet; Refill: 0  4. Chronic nausea - metoCLOPramide (REGLAN) 10 MG tablet; Take 1 tablet (10 mg total) by mouth 2 (two) times daily as needed for nausea.  Dispense: 60 tablet; Refill: 2  5. Depression, unspecified depression type - citalopram (CELEXA) 20 MG tablet; Take 1 tablet (20 mg total) by mouth daily.  Dispense: 90 tablet; Refill: 0  Follow Up Instructions: PRN and for routine medial care    I discussed the assessment and treatment plan with the patient. The patient was provided an opportunity to ask questions and all were answered. The patient agreed with the plan and demonstrated an understanding of the instructions.   The patient was advised to call back or seek an in-person evaluation if the symptoms worsen or if the condition fails to improve as anticipated.     I provided 15 minutes total of non-face-to-face time during this encounter including median intraservice time, reviewing previous notes, investigations, ordering medications, medical decision making, coordinating care and patient verbalized understanding at the end of the visit.    Phill Myron, D.O. Primary Care at [facility]  08/23/2019, 8:40 AM

## 2019-08-24 ENCOUNTER — Telehealth: Payer: Self-pay | Admitting: Internal Medicine

## 2019-08-24 NOTE — Telephone Encounter (Signed)
Pt called asking for her pull ups to be sent as a med instead of L.

## 2019-08-25 NOTE — Telephone Encounter (Signed)
Called [facility]([phone]) to inform them that patient needed size updated. Was transferred to the department to adjust order. They will send updated form to our office.

## 2019-09-14 ENCOUNTER — Other Ambulatory Visit: Payer: Self-pay

## 2019-09-14 DIAGNOSIS — F32A Depression, unspecified: Secondary | ICD-10-CM

## 2019-09-14 DIAGNOSIS — M25562 Pain in left knee: Secondary | ICD-10-CM

## 2019-09-14 DIAGNOSIS — G43109 Migraine with aura, not intractable, without status migrainosus: Secondary | ICD-10-CM

## 2019-09-14 DIAGNOSIS — I10 Essential (primary) hypertension: Secondary | ICD-10-CM

## 2019-09-14 DIAGNOSIS — G8929 Other chronic pain: Secondary | ICD-10-CM

## 2019-09-14 DIAGNOSIS — M797 Fibromyalgia: Secondary | ICD-10-CM

## 2019-09-14 DIAGNOSIS — R110 Nausea: Secondary | ICD-10-CM

## 2019-09-14 MED ORDER — TOPIRAMATE 25 MG PO TABS: 50.0000 mg | ORAL_TABLET | Freq: Every day | ORAL | 0 refills | Status: DC

## 2019-09-14 MED ORDER — DICLOFENAC SODIUM 1 % EX GEL: 4.0000 g | Freq: Four times a day (QID) | CUTANEOUS | 1 refills | Status: DC

## 2019-09-14 MED ORDER — METOCLOPRAMIDE HCL 10 MG PO TABS: 10.0000 mg | ORAL_TABLET | Freq: Two times a day (BID) | ORAL | 2 refills | Status: DC | PRN

## 2019-09-14 MED ORDER — LOSARTAN POTASSIUM 25 MG PO TABS: 25.0000 mg | ORAL_TABLET | Freq: Every day | ORAL | 0 refills | Status: DC

## 2019-09-14 MED ORDER — PANTOPRAZOLE SODIUM 40 MG PO TBEC: 40.0000 mg | DELAYED_RELEASE_TABLET | Freq: Every day | ORAL | 0 refills | Status: DC

## 2019-09-14 MED ORDER — ONDANSETRON 4 MG PO TBDP: 4.0000 mg | ORAL_TABLET | Freq: Three times a day (TID) | ORAL | 2 refills | Status: AC | PRN

## 2019-09-14 MED ORDER — PREGABALIN 75 MG PO CAPS: 75.0000 mg | ORAL_CAPSULE | Freq: Two times a day (BID) | ORAL | 2 refills | Status: DC

## 2019-09-14 MED ORDER — METOPROLOL SUCCINATE ER 25 MG PO TB24: 12.5000 mg | ORAL_TABLET | Freq: Every day | ORAL | 0 refills | Status: DC

## 2019-09-14 MED ORDER — CITALOPRAM HYDROBROMIDE 20 MG PO TABS: 20.0000 mg | ORAL_TABLET | Freq: Every day | ORAL | 0 refills | Status: DC

## 2019-09-14 MED ORDER — LEVOTHYROXINE SODIUM 25 MCG PO TABS: ORAL_TABLET | ORAL | 0 refills | Status: DC

## 2019-09-16 DIAGNOSIS — R3981 Functional urinary incontinence: Secondary | ICD-10-CM | POA: Diagnosis not present

## 2019-09-23 ENCOUNTER — Telehealth: Payer: Self-pay | Admitting: Internal Medicine

## 2019-09-23 NOTE — Telephone Encounter (Signed)
Rx for gel mattress overlay written.   Marcy Siren, D.O. Primary Care at [facility]  09/23/2019, 10:55 AM

## 2019-09-23 NOTE — Telephone Encounter (Signed)
Called [facility] to follow up on the status of the gel mattress overlay. They are requiring a new prescription since the previous provider who ordered it is no longer here. Can you write a prescription & I can fax it back to them?

## 2019-09-23 NOTE — Telephone Encounter (Signed)
Faxed prescription to [phone]. Awaiting confirmation. Will call [facility] once confirmation is received to verify receipt.

## 2019-09-23 NOTE — Telephone Encounter (Signed)
Patient called to follow up on the Rx for a gel overlay mattress. Patient states she called about two weeks ago and has not heard anything. Please f/u

## 2019-09-26 NOTE — Telephone Encounter (Signed)
Fax was received by [facility]. They are requesting additional office notes.

## 2019-10-02 DIAGNOSIS — M48061 Spinal stenosis, lumbar region without neurogenic claudication: Secondary | ICD-10-CM | POA: Diagnosis not present

## 2019-10-06 ENCOUNTER — Telehealth: Payer: Self-pay

## 2019-10-06 NOTE — Telephone Encounter (Signed)
Per Received faxed order for gel overlay but ins requires an office visit note within the last 6 months, cannot be virtual.  Kelly [name] [phone] [facility].

## 2019-10-06 NOTE — Telephone Encounter (Signed)
Note faxed.

## 2019-10-06 NOTE — Telephone Encounter (Signed)
Please advise 

## 2019-10-14 ENCOUNTER — Telehealth: Payer: Self-pay | Admitting: Internal Medicine

## 2019-10-16 DIAGNOSIS — R3981 Functional urinary incontinence: Secondary | ICD-10-CM | POA: Diagnosis not present

## 2019-10-21 ENCOUNTER — Emergency Department (HOSPITAL_COMMUNITY): Payer: Medicare HMO

## 2019-10-21 ENCOUNTER — Other Ambulatory Visit: Payer: Self-pay

## 2019-10-21 ENCOUNTER — Inpatient Hospital Stay (HOSPITAL_COMMUNITY)
Admission: EM | Admit: 2019-10-21 | Discharge: 2019-11-05 | DRG: 368 | Disposition: A | Discharge status: Skilled Nursing Facility | Payer: Medicare HMO | Attending: Internal Medicine | Admitting: Internal Medicine

## 2019-10-21 ENCOUNTER — Encounter (HOSPITAL_COMMUNITY): Payer: Self-pay | Admitting: Emergency Medicine

## 2019-10-21 DIAGNOSIS — Z20822 Contact with and (suspected) exposure to covid-19: Secondary | ICD-10-CM | POA: Diagnosis present

## 2019-10-21 DIAGNOSIS — E871 Hypo-osmolality and hyponatremia: Secondary | ICD-10-CM | POA: Diagnosis present

## 2019-10-21 DIAGNOSIS — E876 Hypokalemia: Secondary | ICD-10-CM | POA: Diagnosis present

## 2019-10-21 DIAGNOSIS — Z681 Body mass index (BMI) 19 or less, adult: Secondary | ICD-10-CM

## 2019-10-21 DIAGNOSIS — R64 Cachexia: Secondary | ICD-10-CM | POA: Diagnosis present

## 2019-10-21 DIAGNOSIS — R739 Hyperglycemia, unspecified: Secondary | ICD-10-CM | POA: Diagnosis present

## 2019-10-21 DIAGNOSIS — F339 Major depressive disorder, recurrent, unspecified: Secondary | ICD-10-CM | POA: Diagnosis not present

## 2019-10-21 DIAGNOSIS — L89220 Pressure ulcer of left hip, unstageable: Secondary | ICD-10-CM | POA: Diagnosis present

## 2019-10-21 DIAGNOSIS — D509 Iron deficiency anemia, unspecified: Secondary | ICD-10-CM | POA: Diagnosis present

## 2019-10-21 DIAGNOSIS — I110 Hypertensive heart disease with heart failure: Secondary | ICD-10-CM | POA: Diagnosis present

## 2019-10-21 DIAGNOSIS — Z7401 Bed confinement status: Secondary | ICD-10-CM

## 2019-10-21 DIAGNOSIS — B3781 Candidal esophagitis: Secondary | ICD-10-CM | POA: Diagnosis present

## 2019-10-21 DIAGNOSIS — I2510 Atherosclerotic heart disease of native coronary artery without angina pectoris: Secondary | ICD-10-CM | POA: Diagnosis present

## 2019-10-21 DIAGNOSIS — T1490XA Injury, unspecified, initial encounter: Secondary | ICD-10-CM | POA: Diagnosis present

## 2019-10-21 DIAGNOSIS — T17908A Unspecified foreign body in respiratory tract, part unspecified causing other injury, initial encounter: Secondary | ICD-10-CM

## 2019-10-21 DIAGNOSIS — I5033 Acute on chronic diastolic (congestive) heart failure: Secondary | ICD-10-CM | POA: Diagnosis present

## 2019-10-21 DIAGNOSIS — J690 Pneumonitis due to inhalation of food and vomit: Secondary | ICD-10-CM | POA: Diagnosis present

## 2019-10-21 DIAGNOSIS — M48061 Spinal stenosis, lumbar region without neurogenic claudication: Secondary | ICD-10-CM | POA: Diagnosis not present

## 2019-10-21 DIAGNOSIS — K449 Diaphragmatic hernia without obstruction or gangrene: Secondary | ICD-10-CM | POA: Diagnosis present

## 2019-10-21 DIAGNOSIS — Z79899 Other long term (current) drug therapy: Secondary | ICD-10-CM

## 2019-10-21 DIAGNOSIS — Z931 Gastrostomy status: Secondary | ICD-10-CM | POA: Diagnosis not present

## 2019-10-21 DIAGNOSIS — K2960 Other gastritis without bleeding: Secondary | ICD-10-CM | POA: Diagnosis present

## 2019-10-21 DIAGNOSIS — I959 Hypotension, unspecified: Secondary | ICD-10-CM | POA: Diagnosis present

## 2019-10-21 DIAGNOSIS — R627 Adult failure to thrive: Secondary | ICD-10-CM | POA: Diagnosis present

## 2019-10-21 DIAGNOSIS — R079 Chest pain, unspecified: Secondary | ICD-10-CM | POA: Diagnosis not present

## 2019-10-21 DIAGNOSIS — E46 Unspecified protein-calorie malnutrition: Secondary | ICD-10-CM | POA: Diagnosis not present

## 2019-10-21 DIAGNOSIS — R5381 Other malaise: Secondary | ICD-10-CM | POA: Diagnosis not present

## 2019-10-21 DIAGNOSIS — K224 Dyskinesia of esophagus: Secondary | ICD-10-CM | POA: Diagnosis not present

## 2019-10-21 DIAGNOSIS — E875 Hyperkalemia: Secondary | ICD-10-CM | POA: Diagnosis present

## 2019-10-21 DIAGNOSIS — R634 Abnormal weight loss: Secondary | ICD-10-CM | POA: Diagnosis not present

## 2019-10-21 DIAGNOSIS — E440 Moderate protein-calorie malnutrition: Secondary | ICD-10-CM | POA: Diagnosis not present

## 2019-10-21 DIAGNOSIS — E43 Unspecified severe protein-calorie malnutrition: Secondary | ICD-10-CM | POA: Diagnosis present

## 2019-10-21 DIAGNOSIS — R197 Diarrhea, unspecified: Secondary | ICD-10-CM | POA: Diagnosis not present

## 2019-10-21 DIAGNOSIS — J449 Chronic obstructive pulmonary disease, unspecified: Secondary | ICD-10-CM | POA: Diagnosis present

## 2019-10-21 DIAGNOSIS — K209 Esophagitis, unspecified without bleeding: Secondary | ICD-10-CM

## 2019-10-21 DIAGNOSIS — R918 Other nonspecific abnormal finding of lung field: Secondary | ICD-10-CM | POA: Diagnosis not present

## 2019-10-21 DIAGNOSIS — D62 Acute posthemorrhagic anemia: Secondary | ICD-10-CM | POA: Diagnosis present

## 2019-10-21 DIAGNOSIS — R1310 Dysphagia, unspecified: Secondary | ICD-10-CM

## 2019-10-21 DIAGNOSIS — K529 Noninfective gastroenteritis and colitis, unspecified: Secondary | ICD-10-CM | POA: Diagnosis present

## 2019-10-21 DIAGNOSIS — E872 Acidosis: Secondary | ICD-10-CM | POA: Diagnosis present

## 2019-10-21 DIAGNOSIS — R1314 Dysphagia, pharyngoesophageal phase: Secondary | ICD-10-CM | POA: Diagnosis present

## 2019-10-21 DIAGNOSIS — F329 Major depressive disorder, single episode, unspecified: Secondary | ICD-10-CM | POA: Diagnosis present

## 2019-10-21 DIAGNOSIS — M25562 Pain in left knee: Secondary | ICD-10-CM

## 2019-10-21 DIAGNOSIS — I10 Essential (primary) hypertension: Secondary | ICD-10-CM | POA: Diagnosis not present

## 2019-10-21 DIAGNOSIS — K7689 Other specified diseases of liver: Secondary | ICD-10-CM | POA: Diagnosis not present

## 2019-10-21 DIAGNOSIS — M6281 Muscle weakness (generalized): Secondary | ICD-10-CM | POA: Diagnosis not present

## 2019-10-21 DIAGNOSIS — N179 Acute kidney failure, unspecified: Secondary | ICD-10-CM | POA: Diagnosis present

## 2019-10-21 DIAGNOSIS — K3189 Other diseases of stomach and duodenum: Secondary | ICD-10-CM | POA: Diagnosis not present

## 2019-10-21 DIAGNOSIS — Z6820 Body mass index (BMI) 20.0-20.9, adult: Secondary | ICD-10-CM

## 2019-10-21 DIAGNOSIS — R7309 Other abnormal glucose: Secondary | ICD-10-CM | POA: Diagnosis not present

## 2019-10-21 DIAGNOSIS — M2550 Pain in unspecified joint: Secondary | ICD-10-CM | POA: Diagnosis not present

## 2019-10-21 DIAGNOSIS — I509 Heart failure, unspecified: Secondary | ICD-10-CM | POA: Diagnosis not present

## 2019-10-21 DIAGNOSIS — E785 Hyperlipidemia, unspecified: Secondary | ICD-10-CM | POA: Diagnosis present

## 2019-10-21 DIAGNOSIS — F17210 Nicotine dependence, cigarettes, uncomplicated: Secondary | ICD-10-CM | POA: Diagnosis present

## 2019-10-21 DIAGNOSIS — R531 Weakness: Secondary | ICD-10-CM | POA: Diagnosis not present

## 2019-10-21 DIAGNOSIS — Z79890 Hormone replacement therapy (postmenopausal): Secondary | ICD-10-CM

## 2019-10-21 DIAGNOSIS — R110 Nausea: Secondary | ICD-10-CM | POA: Diagnosis not present

## 2019-10-21 DIAGNOSIS — J432 Centrilobular emphysema: Secondary | ICD-10-CM | POA: Diagnosis not present

## 2019-10-21 LAB — CBC
HCT: 41.8 % (ref 36.0–46.0)
Hemoglobin: 14.1 g/dL (ref 12.0–15.0)
MCH: 34.3 pg — ABNORMAL HIGH (ref 26.0–34.0)
MCHC: 33.7 g/dL (ref 30.0–36.0)
MCV: 101.7 fL — ABNORMAL HIGH (ref 80.0–100.0)
Platelets: 171 10*3/uL (ref 150–400)
RBC: 4.11 MIL/uL (ref 3.87–5.11)
RDW: 13.4 % (ref 11.5–15.5)
WBC: 17.8 10*3/uL — ABNORMAL HIGH (ref 4.0–10.5)
nRBC: 0 % (ref 0.0–0.2)

## 2019-10-21 LAB — MAGNESIUM: Magnesium: 1.6 mg/dL — ABNORMAL LOW (ref 1.7–2.4)

## 2019-10-21 LAB — BASIC METABOLIC PANEL
Anion gap: 12 (ref 5–15)
BUN: 12 mg/dL (ref 8–23)
CO2: 22 mmol/L (ref 22–32)
Calcium: 8.1 mg/dL — ABNORMAL LOW (ref 8.9–10.3)
Chloride: 102 mmol/L (ref 98–111)
Creatinine, Ser: 1.14 mg/dL — ABNORMAL HIGH (ref 0.44–1.00)
GFR calc Af Amer: 58 mL/min — ABNORMAL LOW (ref >60)
GFR calc non Af Amer: 50 mL/min — ABNORMAL LOW (ref >60)
Glucose, Bld: 115 mg/dL — ABNORMAL HIGH (ref 70–99)
Potassium: 2.6 mmol/L — CL (ref 3.5–5.1)
Sodium: 136 mmol/L (ref 135–145)

## 2019-10-21 LAB — HEPATIC FUNCTION PANEL
ALT: 5 U/L (ref 0–44)
AST: 10 U/L — ABNORMAL LOW (ref 15–41)
Albumin: 2.5 g/dL — ABNORMAL LOW (ref 3.5–5.0)
Alkaline Phosphatase: 69 U/L (ref 38–126)
Bilirubin, Direct: 0.3 mg/dL — ABNORMAL HIGH (ref 0.0–0.2)
Indirect Bilirubin: 0.6 mg/dL (ref 0.3–0.9)
Total Bilirubin: 0.9 mg/dL (ref 0.3–1.2)
Total Protein: 6.1 g/dL — ABNORMAL LOW (ref 6.5–8.1)

## 2019-10-21 LAB — TROPONIN I (HIGH SENSITIVITY)
Troponin I (High Sensitivity): 23 ng/L — ABNORMAL HIGH (ref <18)
Troponin I (High Sensitivity): 26 ng/L — ABNORMAL HIGH (ref <18)

## 2019-10-21 LAB — LIPASE, BLOOD: Lipase: 25 U/L (ref 11–51)

## 2019-10-21 LAB — SARS CORONAVIRUS 2 BY RT PCR (HOSPITAL ORDER, PERFORMED IN ~~LOC~~ HOSPITAL LAB): SARS Coronavirus 2: NEGATIVE

## 2019-10-21 MED ORDER — POTASSIUM CHLORIDE 10 MEQ/100ML IV SOLN
10.0000 meq | INTRAVENOUS | Status: AC
  Administered 2019-10-21 (×2): 10 meq via INTRAVENOUS
  Filled 2019-10-21 (×2): qty 100

## 2019-10-21 MED ORDER — ENOXAPARIN SODIUM 40 MG/0.4ML ~~LOC~~ SOLN
40.0000 mg | SUBCUTANEOUS | Status: DC
  Administered 2019-10-22 – 2019-10-26 (×6): 40 mg via SUBCUTANEOUS
  Filled 2019-10-21 (×6): qty 0.4

## 2019-10-21 MED ORDER — POTASSIUM CHLORIDE 20 MEQ/15ML (10%) PO SOLN
40.0000 meq | Freq: Once | ORAL | Status: AC
  Administered 2019-10-21: 40 meq via ORAL
  Filled 2019-10-21: qty 30

## 2019-10-21 MED ORDER — CIPROFLOXACIN IN D5W 400 MG/200ML IV SOLN: 400.0000 mg | Freq: Once | INTRAVENOUS | Status: DC

## 2019-10-21 MED ORDER — SODIUM CHLORIDE 0.9 % IV SOLN
2.0000 g | INTRAVENOUS | Status: DC
  Administered 2019-10-22 (×2): 2 g via INTRAVENOUS
  Filled 2019-10-21 (×2): qty 20

## 2019-10-21 MED ORDER — METRONIDAZOLE IN NACL 5-0.79 MG/ML-% IV SOLN
500.0000 mg | Freq: Three times a day (TID) | INTRAVENOUS | Status: DC
  Administered 2019-10-22 – 2019-10-23 (×4): 500 mg via INTRAVENOUS
  Filled 2019-10-21 (×4): qty 100

## 2019-10-21 MED ORDER — LACTATED RINGERS IV BOLUS
1000.0000 mL | Freq: Once | INTRAVENOUS | Status: AC
  Administered 2019-10-21: 1000 mL via INTRAVENOUS

## 2019-10-21 MED ORDER — LEVOTHYROXINE SODIUM 100 MCG/5ML IV SOLN
12.5000 ug | Freq: Every day | INTRAVENOUS | Status: AC
  Administered 2019-10-22 – 2019-10-26 (×5): 12.5 ug via INTRAVENOUS
  Filled 2019-10-21 (×5): qty 5

## 2019-10-21 MED ORDER — ACETAMINOPHEN 325 MG PO TABS
650.0000 mg | ORAL_TABLET | Freq: Four times a day (QID) | ORAL | Status: DC | PRN
  Administered 2019-10-28 – 2019-11-04 (×7): 650 mg via ORAL
  Filled 2019-10-21 (×8): qty 2

## 2019-10-21 MED ORDER — ACETAMINOPHEN 650 MG RE SUPP: 650.0000 mg | Freq: Four times a day (QID) | RECTAL | Status: DC | PRN

## 2019-10-21 MED ORDER — PANTOPRAZOLE SODIUM 40 MG IV SOLR
40.0000 mg | Freq: Once | INTRAVENOUS | Status: AC
  Administered 2019-10-22: 40 mg via INTRAVENOUS
  Filled 2019-10-21: qty 40

## 2019-10-21 MED ORDER — SODIUM CHLORIDE 0.9 % IV SOLN: INTRAVENOUS | Status: AC

## 2019-10-21 MED ORDER — POTASSIUM CHLORIDE 10 MEQ/100ML IV SOLN
10.0000 meq | INTRAVENOUS | Status: AC
  Administered 2019-10-22 (×4): 10 meq via INTRAVENOUS
  Filled 2019-10-21 (×4): qty 100

## 2019-10-21 MED ORDER — IOHEXOL 300 MG/ML  SOLN
100.0000 mL | Freq: Once | INTRAMUSCULAR | Status: AC | PRN
  Administered 2019-10-21: 100 mL via INTRAVENOUS

## 2019-10-21 MED ORDER — METRONIDAZOLE IN NACL 5-0.79 MG/ML-% IV SOLN
500.0000 mg | Freq: Once | INTRAVENOUS | Status: AC
  Administered 2019-10-22: 500 mg via INTRAVENOUS
  Filled 2019-10-21: qty 100

## 2019-10-21 MED ORDER — NICOTINE 21 MG/24HR TD PT24
21.0000 mg | MEDICATED_PATCH | Freq: Every day | TRANSDERMAL | Status: DC
  Administered 2019-10-22 – 2019-11-05 (×14): 21 mg via TRANSDERMAL
  Filled 2019-10-21 (×15): qty 1

## 2019-10-21 MED ORDER — PANTOPRAZOLE SODIUM 40 MG IV SOLR
40.0000 mg | INTRAVENOUS | Status: DC
  Administered 2019-10-22: 40 mg via INTRAVENOUS
  Filled 2019-10-21 (×2): qty 40

## 2019-10-21 MED ORDER — MAGNESIUM SULFATE 2 GM/50ML IV SOLN
2.0000 g | Freq: Once | INTRAVENOUS | Status: AC
  Administered 2019-10-22: 2 g via INTRAVENOUS
  Filled 2019-10-21: qty 50

## 2019-10-21 NOTE — ED Triage Notes (Signed)
Pt here with with c/o weakness , nauseated and not eating for the last 8 days , no covid vaccines , cbg 120

## 2019-10-21 NOTE — H&P (Signed)
History and Physical    Kelly [name] UJW:[id number] DOB: April 25, 1955 DOA: 10/21/2019  PCP: Arvilla Market, DO Patient coming from: Home  Chief Complaint: Generalized weakness  HPI: Kelly [name] is a 64 y.o. female with medical history significant of CAD status post PCI, chronic diastolic CHF, hypertension, hyperlipidemia presenting with complaints of generalized weakness, nausea, dysphagia, and poor oral intake. Patient states she has had difficulty swallowing for at least 2 months now and she has been losing a lot of weight. Both food and liquid gets stuck when she tries to swallow and it is painful. States she has lost at least 20 to 25 pounds. She feels nauseous but is not vomiting. She is hardly able to eat anything and when she does, she experiences diarrhea. Denies abdominal pain. Reports having a chronic cough due to history of smoking. She smokes 1 pack of cigarettes daily. Denies fevers, shortness of breath, or chest pain. She has not been vaccinated against Covid.  ED Course: Hypotensive with systolic as low as 80s, remainder vital signs stable.  WBC count 17.8.  Potassium 2.6.  Magnesium 1.6.  Creatinine 1.1, at baseline.  Lipase normal.  No elevation of LFTs.  High-sensitivity troponin mildly elevated but flat (23 >26).  SARS-CoV-2 PCR test negative.  UA pending.  Chest x-ray not suggestive of pneumonia.  CT chest showing findings concerning for potential interstitial process such as RBILD given current and extensive smoking history.    CT abdomen pelvis with findings suggestive of esophagitis.  Also showing findings suggestive of infectious or inflammatory colitis.  Patient received Protonix, potassium supplementation, magnesium supplementation, Cipro, Flagyl, and 1 L LR bolus.  Review of Systems:  All systems reviewed and apart from history of presenting illness, are negative.  Past Medical History:  Diagnosis Date  . CAD (coronary artery disease)    5 stents in the past,  4 in [facility], 1 in [place] [facility]. Last cath 01/2017  . CHF (congestive heart failure) (HCC)   . 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 Hyperlipidemia LDL goal <70   . Hypertension     Past Surgical History:  Procedure Laterality Date  . CARDIAC CATHETERIZATION     stents x 5     reports that she has been smoking. She has a 50.00 pack-year smoking history. She has never used smokeless tobacco. She reports previous alcohol use. She reports current drug use. Drug: Marijuana.  Allergies  Allergen Reactions  . Morphine And Related Nausea And Vomiting    Severe nausea and dry heaves  . Other Other (See Comments)    "Many antibiotics cause severe yeast infections" (can tolerate Z-Paks)    Family History  Problem Relation Age of Onset  . Heart disease Mother        stents x 8 in her life. Passed away in 2014-07-24  . Alzheimer's disease Mother   . Alzheimer's disease Father     Prior to Admission medications   Medication Sig Start Date End Date Taking? Authorizing Provider  citalopram (CELEXA) 20 MG tablet Take 1 tablet (20 mg total) by mouth daily. Patient taking differently: Take 20 mg by mouth at bedtime.  09/14/19  Yes Arvilla Market, DO  diclofenac Sodium (VOLTAREN) 1 % GEL Apply 4 g topically 4 (four) times daily. Patient taking differently: Apply 4 g topically 4 (four) times daily as needed (pain).  09/14/19  Yes Arvilla Market, DO  levothyroxine Janann August)  25 MCG tablet TAKE 1 TABLET BY MOUTH ONCE DAILY BEFORE BREAKFAST Patient taking differently: Take 25 mcg by mouth daily before breakfast.  09/14/19  Yes Arvilla Market, DO  losartan (COZAAR) 25 MG tablet Take 1 tablet (25 mg total) by mouth daily. Patient taking differently: Take 25 mg by mouth at bedtime.  09/14/19  Yes Arvilla Market, DO  metoCLOPramide (REGLAN) 10 MG tablet Take 1 tablet (10 mg total) by mouth 2  (two) times daily as needed for nausea. 09/14/19  Yes Arvilla Market, DO  nitroGLYCERIN (NITROSTAT) 0.4 MG SL tablet Place 0.4 mg under the tongue every 5 (five) minutes as needed for chest pain.   Yes [provider]  ondansetron (ZOFRAN ODT) 4 MG disintegrating tablet Take 1 tablet (4 mg total) by mouth every 8 (eight) hours as needed for nausea or vomiting. 09/14/19  Yes Arvilla Market, DO  pantoprazole (PROTONIX) 40 MG tablet Take 1 tablet (40 mg total) by mouth daily. 09/14/19  Yes Arvilla Market, DO  pregabalin (LYRICA) 75 MG capsule Take 1 capsule (75 mg total) by mouth 2 (two) times daily. 09/14/19  Yes Arvilla Market, DO  topiramate (TOPAMAX) 25 MG tablet Take 2 tablets (50 mg total) by mouth daily. Patient taking differently: Take 25 mg by mouth 2 (two) times daily.  09/14/19  Yes Arvilla Market, DO  metoprolol succinate (TOPROL-XL) 25 MG 24 hr tablet Take 0.5 tablets (12.5 mg total) by mouth daily. Patient not taking: Reported on 10/21/2019 09/14/19   Arvilla Market, DO  rosuvastatin (CRESTOR) 5 MG tablet Take 1 tablet (5 mg total) by mouth daily. Patient not taking: Reported on 10/21/2019 08/23/19   Arvilla Market, DO    Physical Exam: Vitals:   10/21/19 1630 10/21/19 1700 10/21/19 1800 10/21/19 1930  BP: 122/70 116/66 132/66 (!) 119/50  Pulse:  86 89 66  Resp: 16 13 14  (!) 21  Temp:      TempSrc:      SpO2:  98% 97% 97%  Height:        Physical Exam Constitutional:      Appearance: She is ill-appearing.     Comments: Cachectic Very lethargic  HENT:     Head: Normocephalic and atraumatic.     Mouth/Throat:     Mouth: Mucous membranes are dry.     Pharynx: Oropharynx is clear.  Eyes:     Extraocular Movements: Extraocular movements intact.     Conjunctiva/sclera: Conjunctivae normal.  Cardiovascular:     Rate and Rhythm: Normal rate and regular rhythm.     Pulses: Normal pulses.   Pulmonary:     Effort: Pulmonary effort is normal. No respiratory distress.     Breath sounds: No wheezing or rales.     Comments: Coarse breath sounds bilaterally Abdominal:     General: Bowel sounds are normal. There is no distension.     Palpations: Abdomen is soft.     Tenderness: There is no abdominal tenderness. There is no guarding or rebound.  Musculoskeletal:        General: No swelling or tenderness.     Cervical back: Normal range of motion and neck supple.  Skin:    General: Skin is warm and dry.     Comments: Decreased skin turgor  Neurological:     General: No focal deficit present.     Mental Status: She is alert and oriented to person, place, and time.     Labs  on Admission: I have personally reviewed following labs and imaging studies  CBC: Recent Labs  Lab 10/21/19 1355  WBC 17.8*  HGB 14.1  HCT 41.8  MCV 101.7*  PLT 171   Basic Metabolic Panel: Recent Labs  Lab 10/21/19 1355 10/21/19 1730  NA 136  --   K 2.6*  --   CL 102  --   CO2 22  --   GLUCOSE 115*  --   BUN 12  --   CREATININE 1.14*  --   CALCIUM 8.1*  --   MG  --  1.6*   GFR: CrCl cannot be calculated (Unknown ideal weight.). Liver Function Tests: Recent Labs  Lab 10/21/19 1355  AST 10*  ALT 5  ALKPHOS 69  BILITOT 0.9  PROT 6.1*  ALBUMIN 2.5*   Recent Labs  Lab 10/21/19 1355  LIPASE 25   No results for input(s): AMMONIA in the last 168 hours. Coagulation Profile: No results for input(s): INR, PROTIME in the last 168 hours. Cardiac Enzymes: No results for input(s): CKTOTAL, CKMB, CKMBINDEX, TROPONINI in the last 168 hours. BNP (last 3 results) No results for input(s): PROBNP in the last 8760 hours. HbA1C: No results for input(s): HGBA1C in the last 72 hours. CBG: No results for input(s): GLUCAP in the last 168 hours. Lipid Profile: No results for input(s): CHOL, HDL, LDLCALC, TRIG, CHOLHDL, LDLDIRECT in the last 72 hours. Thyroid Function Tests: No results for  input(s): TSH, T4TOTAL, FREET4, T3FREE, THYROIDAB in the last 72 hours. Anemia Panel: No results for input(s): VITAMINB12, FOLATE, FERRITIN, TIBC, IRON, RETICCTPCT in the last 72 hours. Urine analysis: No results found for: COLORURINE, APPEARANCEUR, LABSPEC, PHURINE, GLUCOSEU, HGBUR, BILIRUBINUR, KETONESUR, PROTEINUR, UROBILINOGEN, NITRITE, LEUKOCYTESUR  Radiological Exams on Admission: DG Chest 2 View  Result Date: 10/21/2019 CLINICAL DATA:  Chest pain for 2-3 months, congestion, weakness, history coronary artery disease, CHF, hypertension EXAM: CHEST - 2 VIEW COMPARISON:  05/02/2018 FINDINGS: Normal heart size, mediastinal contours, and pulmonary vascularity. Multiple coronary stents. Atherosclerotic calcification aorta. Lungs mildly hyperinflated but clear. No acute infiltrate, pleural effusion or pneumothorax. Bones demineralized with scoliosis thoracic spine. IMPRESSION: Hyperinflated lungs without infiltrate. Electronically Signed   By: Ulyses Southward M.D.   On: 10/21/2019 14:17   CT CHEST ABDOMEN PELVIS W CONTRAST  Result Date: 10/21/2019 CLINICAL DATA:  Generalized weakness, weight loss EXAM: CT CHEST, ABDOMEN, AND PELVIS WITH CONTRAST TECHNIQUE: Multidetector CT imaging of the chest, abdomen and pelvis was performed following the standard protocol during bolus administration of intravenous contrast. CONTRAST:  OMNIPAQUE IOHEXOL 300 MG/ML  SOLN COMPARISON:  CT 10/22/2018, radiograph 10/21/2019 FINDINGS: CT CHEST FINDINGS Cardiovascular: The aortic root is suboptimally assessed given cardiac pulsation artifact. Atherosclerotic plaque within the normal caliber aorta. No acute luminal abnormality. No periaortic stranding or hemorrhage. Shared origin of the brachiocephalic and left common carotid artery. Proximal great vessels demonstrate atheromatous plaque including calcification at the left vertebral artery ostium incompletely assessed on this non angiographic technique. Central pulmonary  arteries are borderline enlarged. No large central or lobar filling defects on this non tailored examination. Cardiac size within normal limits. Three-vessel coronary artery disease is noted. No pericardial effusion. No major venous anomalies. Mediastinum/Nodes: No mediastinal fluid or gas. Postsurgical changes likely reflecting prior left hemithyroidectomy with a normal appearance of right lobe thyroid gland. No acute abnormality of the trachea. Mild circumferential thickening and mucosal hyperemia of the mid to distal thoracic esophagus with small amount of fluid the level of the mid esophagus. No  worrisome mediastinal, hilar or axillary adenopathy. Lungs/Pleura: Nonspecific ill-defined centrilobular micro nodularity distributed throughout the bilateral upper lobes/apices as well as further areas of ground-glass opacity with a mid to upper lung predilection. Some mild diffuse airways thickening and scattered secretions are noted as well as a background of mild centrilobular emphysematous change. No focal consolidative opacity is evident. No pneumothorax. No other concerning nodularity or pulmonary masses. Musculoskeletal: The osseous structures appear diffusely demineralized which may limit detection of small or nondisplaced fractures. No acute osseous abnormality or suspicious osseous lesion. Mild chest wall deformity associated with the severe dextrocurvature of the thoracolumbar spine. Multilevel degenerative changes in the spine. Milder degenerative changes in the bilateral shoulders. No acute or worrisome osseous lesions are evident. CT ABDOMEN PELVIS FINDINGS Hepatobiliary: Mildly heterogenous hepatic parenchymal enhancement. Small normal hepatic attenuation and smooth liver surface contour seen on the delayed phase imaging. No visible focal liver lesions. Mild intra and extrahepatic biliary ductal dilatation is possibly related to reservoir effect given the postsurgical changes from prior cholecystectomy  and similarity to comparison exam without visible intraductal gallstones. Pancreas: Unremarkable. No pancreatic ductal dilatation or surrounding inflammatory changes. Spleen: Normal in size. No concerning splenic lesions. Adrenals/Urinary Tract: Lobular thickening of the bilateral adrenal glands. No concerning dominant adrenal nodules. Mild bilateral cortical thinning both kidneys which otherwise enhance and excrete symmetrically. Nonspecific mild bilateral perinephric stranding is also unchanged from prior and is a nonspecific finding. No concerning renal masses. No urolithiasis or hydronephrosis. Urinary bladder is unremarkable. Stomach/Bowel: Thickened distal thoracic esophagus. Postsurgical changes at the GE junction may reflect prior fundoplication/wrap procedure. Duodenum with a normal course across the midline abdomen. No small bowel thickening or dilatation is evident. A normal appendix is visualized. Diffuse pancolonic edematous mural thickening and mucosal hyperemia with faint pericolonic hazy stranding. Lack of formed stool in the colon. Fluid-filled rectum noted as well. Serpentine hyperenhancement towards the rectum likely reflecting hemorrhoidal enhancement. Vascular/Lymphatic: Atherosclerotic calcifications within the abdominal aorta and branch vessels. No aneurysm or ectasia. Paired renal arteries bilaterally. Atheromatous ostial narrowing at the SMA, IMA and renal artery origins. No enlarged abdominopelvic lymph nodes. Reproductive: Mildly retroverted uterus. No concerning adnexal lesions. Previously seen cystic lesion in the right ovary no longer readily apparent with quiescent appearance of the ovarian tissue. Other: Circumferential body wall edema. Suspect some mild superficial ulceration adjacent the bilateral greater trochanters and left ischial tuberosity without organized collection or abscess. No abdominopelvic free air or fluid. Mild body wall edema. No bowel containing hernias.  Musculoskeletal: Diffuse muscular atrophy. Severe dextrocurvature of the thoracolumbar spine with apex at the L2-3 level and evidence of prior L3-L5 posterior spinal fusion and interbody cage placement. No evidence of hardware complication. Prior right iliac group bone harvest site. The osseous structures appear diffusely demineralized which may limit detection of small or nondisplaced fractures. No acute or suspicious osseous lesions are evident at this time. Multilevel degenerative changes in the spine. Additional degenerative changes in the hips and pelvis. IMPRESSION: 1. Nonspecific ill-defined centrilobular micro nodularity distributed throughout the bilateral upper lobes/apices as well as further areas of ground-glass opacity with a mid to upper lung predilection. Findings are suggestive of a potential interstitial process such as RB I LD particularly in this patient with current and extensive smoking history. 2. Background of mild centrilobular emphysema ( Emphysema (ICD10-J43.9).) 3. Mild circumferential thickening and mucosal hyperemia of the mid to distal thoracic esophagus with small amount of fluid the level of the mid esophagus, suggestive of esophagitis. Consider correlation with  outpatient endoscopy. 4. Diffuse pancolonic edematous mural thickening and mucosal hyperemia with faint pericolonic hazy stranding. Lack of formed stool in the colon. Serpentine hyperenhancement towards the rectum likely reflecting hemorrhoidal enhancement. Findings are suggestive of infectious or inflammatory colitis with rapid transit state. 5. Mildly heterogenous hepatic parenchymal enhancement, nonspecific but can be seen with hepatitis. Correlate with LFTs. 6. Biliary ductal prominence likely related to post cholecystectomy reservoir effect given stability and lack of visible intraductal gallstones. 7. Severe dextrocurvature of the thoracolumbar spine with apex at the L2-3 level and evidence of prior L3-L5 posterior  spinal fusion and interbody cage placement. No evidence of hardware complication.Mild associated chest wall deformity. 8. Suspect some mild superficial ulceration adjacent the bilateral greater trochanters and left ischial tuberosity without organized collection or abscess. Correlate with visual inspection. 9. Three-vessel coronary artery disease. 10. Aortic Atherosclerosis (ICD10-I70.0). Ostial narrowing at the IMA, SMA and renal artery origins. Electronically Signed   By: Kreg Shropshire M.D.   On: 10/21/2019 19:35    EKG: Independently reviewed.  Sinus rhythm, QTC 510.  No acute ischemic changes.  Assessment/Plan Principal Problem:   Esophagitis Active Problems:   Dysphagia   Unintentional weight loss   Colitis   Hypokalemia   Esophagitis/ dysphagia/ odynophagia/ unintentional weight loss: Patient presenting with complaints of dysphagia/ odynophagia, very poor oral intake, and significant unintentional weight loss.  CT with findings suggestive of esophagitis. -Continue IV PPI.  Keep n.p.o., SLP eval.  I have sent a secure chat message to Dr. Matthias Hughs requesting EGD for further evaluation.  Colitis: CT showing findings suggestive of infectious or inflammatory colitis.  Labs showing leukocytosis.  Hypotensive initially, suspect due to severe volume depletion in the setting of poor oral intake.  No fever, tachycardia, or other signs of sepsis. -Continue IV fluid hydration.  Ceftriaxone and Flagyl.  Check lactic acid level.  Continue to monitor WBC count.  Order blood culture x2.  C. difficile PCR and GI pathogen panel. Follow enteric precautions.  Hypokalemia/hypomagnesemia: Likely due to poor oral intake.  Potassium 2.6, EKG with QT prolongation.  Magnesium 1.6. -Cardiac monitoring.  Potassium and magnesium supplementation.  Continue to monitor electrolytes closely.  QT prolongation on EKG: Suspect due to hypokalemia. -Cardiac monitoring.  Monitor potassium and magnesium levels.  Avoid QT  prolonging drugs if possible.  Repeat EKG in a.m.  Mild troponin elevation: High-sensitivity troponin mildly elevated but flat (23 >26).  EKG without acute ischemic changes.  Patient is not complaining of chest pain. -Cardiac monitoring  Concern for ILD: CT chest showing findings concerning for potential interstitial process such as RBILD given current and extensive smoking history.  No signs of respiratory distress at this time. -Ensure pulmonology follow-up.  Tobacco use -NicoDerm patch and counseling  Generalized weakness -PT and OT evaluation  DVT prophylaxis: Lovenox Code Status: Patient wishes to be full code. Family Communication: No family available at this time. Disposition Plan: It is my clinical opinion that referral for OBSERVATION is reasonable and necessary in this patient based on the above information provided. The aforementioned taken together are felt to place the patient at high risk for further clinical deterioration. However it is anticipated that the patient may be medically stable for discharge from the hospital within 24 to 48 hours.  The medical decision making on this patient was of high complexity and the patient is at high risk for clinical deterioration, therefore this is a level 3 visit.  John Giovanni MD [facility]  If 7PM-7AM, please contact night-coverage  [email or web address]  10/21/2019, 8:47 PM

## 2019-10-21 NOTE — ED Provider Notes (Signed)
[name] EMERGENCY DEPARTMENT Provider Note   CSN: [id number] Arrival date & time: 10/21/19  1340     History Chief Complaint  Patient presents with  . Weakness    Kelly [name] is a 64 y.o. female.  HPI 64 year old female presents with poor p.o. intake and trouble swallowing.  She also feels generally weak.  This has been ongoing for about 2 weeks.  She has been losing weight.  Feels like it is very hard to swallow, liquids and food.  She is able to swallow her pills but it takes a lot of moving around in her throat.  She feels like there are 2 knots in her throat though there is no pain.  There is no chest pain or shortness of breath.  Mild cough.  No fevers.  She has to go to the bathroom very soon after eating but otherwise is not having diarrhea.  She is having a little bit of left lower abdominal pain.  Has a history of hiatal hernia repair and so she states she can vomit but sometimes feels like she needs to.   Past Medical History:  Diagnosis Date  . CAD (coronary artery disease)    5 stents in the past, 4 in [facility], 1 in [facility]. Last cath 01/2017  . CHF (congestive heart failure) (HCC)   . 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 Hyperlipidemia LDL goal <70   . Hypertension     Patient Active Problem List   Diagnosis Date Noted  . Dysphagia 10/21/2019  . Esophagitis 10/21/2019  . Unintentional weight loss 10/21/2019  . Colitis 10/21/2019  . Hypokalemia 10/21/2019  . Abnormality of gait 12/28/2018  . History of falling 12/28/2018  . Wheelchair dependent 12/28/2018  . Scoliosis (and kyphoscoliosis), idiopathic 12/28/2018  . HTN (hypertension) 12/08/2018  . COPD (chronic obstructive pulmonary disease) (HCC) 12/08/2018  . Migraine with aura and without status migrainosus, not intractable 11/30/2018  . Coronary artery disease involving native coronary artery of native  heart without angina pectoris 11/30/2018  . Functional fecal incontinence 11/30/2018  . Functional urinary incontinence 11/30/2018  . Fibromyalgia 11/30/2018  . Chest pain 05/02/2018    Past Surgical History:  Procedure Laterality Date  . CARDIAC CATHETERIZATION     stents x 5     OB History   No obstetric history on file.     Family History  Problem Relation Age of Onset  . Heart disease Mother        stents x 8 in her life. Passed away in 20-Jul-2014  . Alzheimer's disease Mother   . Alzheimer's disease Father     Social History   Tobacco Use  . Smoking status: Current Every Day Smoker    Packs/day: 1.00    Years: 50.00    Pack years: 50.00  . Smokeless tobacco: Never Used  Substance Use Topics  . 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 Drug use: Yes    Types: Marijuana    Home Medications Prior to Admission medications   Medication Sig Start Date End Date Taking? Authorizing Provider  citalopram (CELEXA) 20 MG tablet Take 1 tablet (20 mg total) by mouth daily. Patient taking differently: Take 20 mg by mouth at bedtime.  09/14/19  Yes Arvilla Market, DO  diclofenac Sodium (VOLTAREN) 1 % GEL Apply 4 g topically 4 (  four) times daily. Patient taking differently: Apply 4 g topically 4 (four) times daily as needed (pain).  09/14/19  Yes Arvilla Market, DO  levothyroxine (EUTHYROX) 25 MCG tablet TAKE 1 TABLET BY MOUTH ONCE DAILY BEFORE BREAKFAST Patient taking differently: Take 25 mcg by mouth daily before breakfast.  09/14/19  Yes Arvilla Market, DO  losartan (COZAAR) 25 MG tablet Take 1 tablet (25 mg total) by mouth daily. Patient taking differently: Take 25 mg by mouth at bedtime.  09/14/19  Yes Arvilla Market, DO  metoCLOPramide (REGLAN) 10 MG tablet Take 1 tablet (10 mg total) by mouth 2 (two) times daily as needed for nausea. 09/14/19  Yes Arvilla Market, DO  nitroGLYCERIN  (NITROSTAT) 0.4 MG SL tablet Place 0.4 mg under the tongue every 5 (five) minutes as needed for chest pain.   Yes [provider]  ondansetron (ZOFRAN ODT) 4 MG disintegrating tablet Take 1 tablet (4 mg total) by mouth every 8 (eight) hours as needed for nausea or vomiting. 09/14/19  Yes Arvilla Market, DO  pantoprazole (PROTONIX) 40 MG tablet Take 1 tablet (40 mg total) by mouth daily. 09/14/19  Yes Arvilla Market, DO  pregabalin (LYRICA) 75 MG capsule Take 1 capsule (75 mg total) by mouth 2 (two) times daily. 09/14/19  Yes Arvilla Market, DO  topiramate (TOPAMAX) 25 MG tablet Take 2 tablets (50 mg total) by mouth daily. Patient taking differently: Take 25 mg by mouth 2 (two) times daily.  09/14/19  Yes Arvilla Market, DO  metoprolol succinate (TOPROL-XL) 25 MG 24 hr tablet Take 0.5 tablets (12.5 mg total) by mouth daily. Patient not taking: Reported on 10/21/2019 09/14/19   Arvilla Market, DO  rosuvastatin (CRESTOR) 5 MG tablet Take 1 tablet (5 mg total) by mouth daily. Patient not taking: Reported on 10/21/2019 08/23/19   Arvilla Market, DO    Allergies    Morphine and related and Other  Review of Systems   Review of Systems  Constitutional: Positive for unexpected weight change. Negative for fever.  HENT: Positive for trouble swallowing.   Respiratory: Positive for cough. Negative for shortness of breath.   Cardiovascular: Negative for chest pain.  Gastrointestinal: Positive for abdominal pain and nausea. Negative for vomiting.  Genitourinary: Negative for dysuria.  Neurological: Positive for weakness.  All other systems reviewed and are negative.   Physical Exam Updated Vital Signs BP (!) 113/43   Pulse 72   Temp 98.7 F (37.1 C)   Resp 18   Ht  (1.651 m)   SpO2 95%   BMI 20.47 kg/m   Physical Exam Vitals and nursing note reviewed.  Constitutional:      Appearance: She is well-developed. She is  cachectic.  HENT:     Head: Normocephalic and atraumatic.     Right Ear: External ear normal.     Left Ear: External ear normal.     Nose: Nose normal.     Mouth/Throat:     Mouth: Mucous membranes are dry.     Comments: No obvious lesions or other abnormalities to her oropharynx Eyes:     General:        Right eye: No discharge.        Left eye: No discharge.  Cardiovascular:     Rate and Rhythm: Regular rhythm. Tachycardia present.     Heart sounds: Normal heart sounds.     Comments: HR~100 Pulmonary:     Effort: Pulmonary effort  is normal.     Breath sounds: Normal breath sounds.  Abdominal:     Palpations: Abdomen is soft.     Tenderness: There is abdominal tenderness in the left lower quadrant.  Skin:    General: Skin is warm and dry.  Neurological:     Mental Status: She is alert.  Psychiatric:        Mood and Affect: Mood is not anxious.     ED Results / Procedures / Treatments   Labs (all labs ordered are listed, but only abnormal results are displayed) Labs Reviewed  BASIC METABOLIC PANEL - Abnormal; Notable for the following components:      Result Value   Potassium 2.6 (*)    Glucose, Bld 115 (*)    Creatinine, Ser 1.14 (*)    Calcium 8.1 (*)    GFR calc non Af Amer 50 (*)    GFR calc Af Amer 58 (*)    All other components within normal limits  CBC - Abnormal; Notable for the following components:   WBC 17.8 (*)    MCV 101.7 (*)    MCH 34.3 (*)    All other components within normal limits  HEPATIC FUNCTION PANEL - Abnormal; Notable for the following components:   Total Protein 6.1 (*)    Albumin 2.5 (*)    AST 10 (*)    Bilirubin, Direct 0.3 (*)    All other components within normal limits  MAGNESIUM - Abnormal; Notable for the following components:   Magnesium 1.6 (*)    All other components within normal limits  TROPONIN I (HIGH SENSITIVITY) - Abnormal; Notable for the following components:   Troponin I (High Sensitivity) 23 (*)    All other  components within normal limits  TROPONIN I (HIGH SENSITIVITY) - Abnormal; Notable for the following components:   Troponin I (High Sensitivity) 26 (*)    All other components within normal limits  SARS CORONAVIRUS 2 BY RT PCR (HOSPITAL ORDER, PERFORMED IN [facility])  GASTROINTESTINAL PANEL BY PCR, STOOL (REPLACES STOOL CULTURE)  C DIFFICILE QUICK SCREEN W PCR REFLEX  CULTURE, BLOOD (ROUTINE X 2)  CULTURE, BLOOD (ROUTINE X 2)  LIPASE, BLOOD  URINALYSIS, ROUTINE W REFLEX MICROSCOPIC  LACTIC ACID, PLASMA  BASIC METABOLIC PANEL  MAGNESIUM  HIV ANTIBODY (ROUTINE TESTING W REFLEX)    EKG EKG Interpretation  Date/Time:  Friday October 21 2019 16:37:55 EDT Ventricular Rate:  83 PR Interval:  154 QRS Duration: 117 QT Interval:  434 QTC Calculation: 510 R Axis:   -50 Text Interpretation: Sinus rhythm Atrial premature complex Probable left atrial enlargement LVH with IVCD, LAD and secondary repol abnrm Prolonged QT interval Confirmed by Pricilla Loveless [phone]) on 10/21/2019 4:41:33 PM   Radiology DG Chest 2 View  Result Date: 10/21/2019 CLINICAL DATA:  Chest pain for 2-3 months, congestion, weakness, history coronary artery disease, CHF, hypertension EXAM: CHEST - 2 VIEW COMPARISON:  05/02/2018 FINDINGS: Normal heart size, mediastinal contours, and pulmonary vascularity. Multiple coronary stents. Atherosclerotic calcification aorta. Lungs mildly hyperinflated but clear. No acute infiltrate, pleural effusion or pneumothorax. Bones demineralized with scoliosis thoracic spine. IMPRESSION: Hyperinflated lungs without infiltrate. Electronically Signed   By: Ulyses Southward M.D.   On: 10/21/2019 14:17   CT CHEST ABDOMEN PELVIS W CONTRAST  Result Date: 10/21/2019 CLINICAL DATA:  Generalized weakness, weight loss EXAM: CT CHEST, ABDOMEN, AND PELVIS WITH CONTRAST TECHNIQUE: Multidetector CT imaging of the chest, abdomen and pelvis was performed following the standard  protocol during  bolus administration of intravenous contrast. CONTRAST:  OMNIPAQUE IOHEXOL 300 MG/ML  SOLN COMPARISON:  CT 10/22/2018, radiograph 10/21/2019 FINDINGS: CT CHEST FINDINGS Cardiovascular: The aortic root is suboptimally assessed given cardiac pulsation artifact. Atherosclerotic plaque within the normal caliber aorta. No acute luminal abnormality. No periaortic stranding or hemorrhage. Shared origin of the brachiocephalic and left common carotid artery. Proximal great vessels demonstrate atheromatous plaque including calcification at the left vertebral artery ostium incompletely assessed on this non angiographic technique. Central pulmonary arteries are borderline enlarged. No large central or lobar filling defects on this non tailored examination. Cardiac size within normal limits. Three-vessel coronary artery disease is noted. No pericardial effusion. No major venous anomalies. Mediastinum/Nodes: No mediastinal fluid or gas. Postsurgical changes likely reflecting prior left hemithyroidectomy with a normal appearance of right lobe thyroid gland. No acute abnormality of the trachea. Mild circumferential thickening and mucosal hyperemia of the mid to distal thoracic esophagus with small amount of fluid the level of the mid esophagus. No worrisome mediastinal, hilar or axillary adenopathy. Lungs/Pleura: Nonspecific ill-defined centrilobular micro nodularity distributed throughout the bilateral upper lobes/apices as well as further areas of ground-glass opacity with a mid to upper lung predilection. Some mild diffuse airways thickening and scattered secretions are noted as well as a background of mild centrilobular emphysematous change. No focal consolidative opacity is evident. No pneumothorax. No other concerning nodularity or pulmonary masses. Musculoskeletal: The osseous structures appear diffusely demineralized which may limit detection of small or nondisplaced fractures. No acute osseous abnormality or  suspicious osseous lesion. Mild chest wall deformity associated with the severe dextrocurvature of the thoracolumbar spine. Multilevel degenerative changes in the spine. Milder degenerative changes in the bilateral shoulders. No acute or worrisome osseous lesions are evident. CT ABDOMEN PELVIS FINDINGS Hepatobiliary: Mildly heterogenous hepatic parenchymal enhancement. Small normal hepatic attenuation and smooth liver surface contour seen on the delayed phase imaging. No visible focal liver lesions. Mild intra and extrahepatic biliary ductal dilatation is possibly related to reservoir effect given the postsurgical changes from prior cholecystectomy and similarity to comparison exam without visible intraductal gallstones. Pancreas: Unremarkable. No pancreatic ductal dilatation or surrounding inflammatory changes. Spleen: Normal in size. No concerning splenic lesions. Adrenals/Urinary Tract: Lobular thickening of the bilateral adrenal glands. No concerning dominant adrenal nodules. Mild bilateral cortical thinning both kidneys which otherwise enhance and excrete symmetrically. Nonspecific mild bilateral perinephric stranding is also unchanged from prior and is a nonspecific finding. No concerning renal masses. No urolithiasis or hydronephrosis. Urinary bladder is unremarkable. Stomach/Bowel: Thickened distal thoracic esophagus. Postsurgical changes at the GE junction may reflect prior fundoplication/wrap procedure. Duodenum with a normal course across the midline abdomen. No small bowel thickening or dilatation is evident. A normal appendix is visualized. Diffuse pancolonic edematous mural thickening and mucosal hyperemia with faint pericolonic hazy stranding. Lack of formed stool in the colon. Fluid-filled rectum noted as well. Serpentine hyperenhancement towards the rectum likely reflecting hemorrhoidal enhancement. Vascular/Lymphatic: Atherosclerotic calcifications within the abdominal aorta and branch vessels. No  aneurysm or ectasia. Paired renal arteries bilaterally. Atheromatous ostial narrowing at the SMA, IMA and renal artery origins. No enlarged abdominopelvic lymph nodes. Reproductive: Mildly retroverted uterus. No concerning adnexal lesions. Previously seen cystic lesion in the right ovary no longer readily apparent with quiescent appearance of the ovarian tissue. Other: Circumferential body wall edema. Suspect some mild superficial ulceration adjacent the bilateral greater trochanters and left ischial tuberosity without organized collection or abscess. No abdominopelvic free air or fluid. Mild body wall edema. No  bowel containing hernias. Musculoskeletal: Diffuse muscular atrophy. Severe dextrocurvature of the thoracolumbar spine with apex at the L2-3 level and evidence of prior L3-L5 posterior spinal fusion and interbody cage placement. No evidence of hardware complication. Prior right iliac group bone harvest site. The osseous structures appear diffusely demineralized which may limit detection of small or nondisplaced fractures. No acute or suspicious osseous lesions are evident at this time. Multilevel degenerative changes in the spine. Additional degenerative changes in the hips and pelvis. IMPRESSION: 1. Nonspecific ill-defined centrilobular micro nodularity distributed throughout the bilateral upper lobes/apices as well as further areas of ground-glass opacity with a mid to upper lung predilection. Findings are suggestive of a potential interstitial process such as RB I LD particularly in this patient with current and extensive smoking history. 2. Background of mild centrilobular emphysema ( Emphysema (ICD10-J43.9).) 3. Mild circumferential thickening and mucosal hyperemia of the mid to distal thoracic esophagus with small amount of fluid the level of the mid esophagus, suggestive of esophagitis. Consider correlation with outpatient endoscopy. 4. Diffuse pancolonic edematous mural thickening and mucosal  hyperemia with faint pericolonic hazy stranding. Lack of formed stool in the colon. Serpentine hyperenhancement towards the rectum likely reflecting hemorrhoidal enhancement. Findings are suggestive of infectious or inflammatory colitis with rapid transit state. 5. Mildly heterogenous hepatic parenchymal enhancement, nonspecific but can be seen with hepatitis. Correlate with LFTs. 6. Biliary ductal prominence likely related to post cholecystectomy reservoir effect given stability and lack of visible intraductal gallstones. 7. Severe dextrocurvature of the thoracolumbar spine with apex at the L2-3 level and evidence of prior L3-L5 posterior spinal fusion and interbody cage placement. No evidence of hardware complication.Mild associated chest wall deformity. 8. Suspect some mild superficial ulceration adjacent the bilateral greater trochanters and left ischial tuberosity without organized collection or abscess. Correlate with visual inspection. 9. Three-vessel coronary artery disease. 10. Aortic Atherosclerosis (ICD10-I70.0). Ostial narrowing at the IMA, SMA and renal artery origins. Electronically Signed   By: Kreg Shropshire M.D.   On: 10/21/2019 19:35    Procedures Procedures (including critical care time)  Medications Ordered in ED Medications  potassium chloride 10 mEq in 100 mL IVPB (0 mEq Intravenous Stopped 10/21/19 2220)  pantoprazole (PROTONIX) injection 40 mg (has no administration in time range)  magnesium sulfate IVPB 2 g 50 mL (has no administration in time range)  metroNIDAZOLE (FLAGYL) IVPB 500 mg (has no administration in time range)  cefTRIAXone (ROCEPHIN) 2 g in sodium chloride 0.9 % 100 mL IVPB (has no administration in time range)  metroNIDAZOLE (FLAGYL) IVPB 500 mg (has no administration in time range)  pantoprazole (PROTONIX) injection 40 mg (has no administration in time range)  potassium chloride 10 mEq in 100 mL IVPB (has no administration in time range)  nicotine (NICODERM CQ -  dosed in mg/24 hours) patch 21 mg (has no administration in time range)  0.9 %  sodium chloride infusion (has no administration in time range)  levothyroxine (SYNTHROID, LEVOTHROID) injection 12.5 mcg (has no administration in time range)  enoxaparin (LOVENOX) injection 40 mg (has no administration in time range)  acetaminophen (TYLENOL) tablet 650 mg (has no administration in time range)    Or  acetaminophen (TYLENOL) suppository 650 mg (has no administration in time range)  potassium chloride 20 MEQ/15ML (10%) solution 40 mEq (40 mEq Oral Given 10/21/19 1743)  lactated ringers bolus 1,000 mL (1,000 mLs Intravenous New Bag/Given 10/21/19 1739)  iohexol (OMNIPAQUE) 300 MG/ML solution 100 mL (100 mLs Intravenous Contrast Given 10/21/19 1900)  ED Course  I have reviewed the triage vital signs and the nursing notes.  Pertinent labs & imaging results that were available during my care of the patient were reviewed by me and considered in my medical decision making (see chart for details).    MDM Rules/Calculators/A&P                          Patient's work-up shows hypokalemia and hypomagnesemia, likely from poor p.o. intake and vomiting.  CT shows esophagitis as well as perhaps some colitis.  Given she has been having diarrhea we will treat this with Flagyl and Cipro.  We will give Protonix for the esophagitis.  She is having poor p.o. intake in the ED and had a hard time swallowing potassium liquid.  I do not think she is currently ready for discharge and will need electrolyte replete meant and supportive care.  Dr. Loney Loh to admit. Final Clinical Impression(s) / ED Diagnoses Final diagnoses:  Dysphagia, unspecified type  Hypokalemia  Hypomagnesemia  Colitis    Rx / DC Orders ED Discharge Orders    None       Pricilla Loveless, MD 10/21/19 2252

## 2019-10-22 ENCOUNTER — Encounter (HOSPITAL_COMMUNITY)
Admission: EM | Disposition: A | Discharge status: Skilled Nursing Facility | Payer: Self-pay | Source: Home / Self Care | Attending: Internal Medicine

## 2019-10-22 ENCOUNTER — Observation Stay (HOSPITAL_COMMUNITY): Payer: Medicare HMO | Admitting: Certified Registered Nurse Anesthetist

## 2019-10-22 ENCOUNTER — Encounter (HOSPITAL_COMMUNITY): Payer: Self-pay | Admitting: Internal Medicine

## 2019-10-22 DIAGNOSIS — D509 Iron deficiency anemia, unspecified: Secondary | ICD-10-CM | POA: Diagnosis present

## 2019-10-22 DIAGNOSIS — R1310 Dysphagia, unspecified: Secondary | ICD-10-CM | POA: Diagnosis not present

## 2019-10-22 DIAGNOSIS — I2510 Atherosclerotic heart disease of native coronary artery without angina pectoris: Secondary | ICD-10-CM | POA: Diagnosis present

## 2019-10-22 DIAGNOSIS — Z681 Body mass index (BMI) 19 or less, adult: Secondary | ICD-10-CM | POA: Diagnosis not present

## 2019-10-22 DIAGNOSIS — E440 Moderate protein-calorie malnutrition: Secondary | ICD-10-CM | POA: Diagnosis not present

## 2019-10-22 DIAGNOSIS — K449 Diaphragmatic hernia without obstruction or gangrene: Secondary | ICD-10-CM | POA: Diagnosis present

## 2019-10-22 DIAGNOSIS — B3781 Candidal esophagitis: Secondary | ICD-10-CM | POA: Diagnosis present

## 2019-10-22 DIAGNOSIS — I959 Hypotension, unspecified: Secondary | ICD-10-CM | POA: Diagnosis present

## 2019-10-22 DIAGNOSIS — E872 Acidosis: Secondary | ICD-10-CM | POA: Diagnosis present

## 2019-10-22 DIAGNOSIS — R64 Cachexia: Secondary | ICD-10-CM | POA: Diagnosis present

## 2019-10-22 DIAGNOSIS — F17210 Nicotine dependence, cigarettes, uncomplicated: Secondary | ICD-10-CM | POA: Diagnosis present

## 2019-10-22 DIAGNOSIS — Z20822 Contact with and (suspected) exposure to covid-19: Secondary | ICD-10-CM | POA: Diagnosis present

## 2019-10-22 DIAGNOSIS — I110 Hypertensive heart disease with heart failure: Secondary | ICD-10-CM | POA: Diagnosis present

## 2019-10-22 DIAGNOSIS — D62 Acute posthemorrhagic anemia: Secondary | ICD-10-CM | POA: Diagnosis present

## 2019-10-22 DIAGNOSIS — I5033 Acute on chronic diastolic (congestive) heart failure: Secondary | ICD-10-CM | POA: Diagnosis present

## 2019-10-22 DIAGNOSIS — L89220 Pressure ulcer of left hip, unstageable: Secondary | ICD-10-CM | POA: Diagnosis present

## 2019-10-22 DIAGNOSIS — E43 Unspecified severe protein-calorie malnutrition: Secondary | ICD-10-CM | POA: Diagnosis present

## 2019-10-22 DIAGNOSIS — K529 Noninfective gastroenteritis and colitis, unspecified: Secondary | ICD-10-CM | POA: Diagnosis present

## 2019-10-22 DIAGNOSIS — E871 Hypo-osmolality and hyponatremia: Secondary | ICD-10-CM | POA: Diagnosis present

## 2019-10-22 DIAGNOSIS — T1490XA Injury, unspecified, initial encounter: Secondary | ICD-10-CM | POA: Diagnosis present

## 2019-10-22 DIAGNOSIS — E785 Hyperlipidemia, unspecified: Secondary | ICD-10-CM | POA: Diagnosis present

## 2019-10-22 DIAGNOSIS — J449 Chronic obstructive pulmonary disease, unspecified: Secondary | ICD-10-CM | POA: Diagnosis present

## 2019-10-22 DIAGNOSIS — K209 Esophagitis, unspecified without bleeding: Secondary | ICD-10-CM | POA: Diagnosis not present

## 2019-10-22 DIAGNOSIS — J690 Pneumonitis due to inhalation of food and vomit: Secondary | ICD-10-CM | POA: Diagnosis present

## 2019-10-22 DIAGNOSIS — E876 Hypokalemia: Secondary | ICD-10-CM | POA: Diagnosis present

## 2019-10-22 DIAGNOSIS — N179 Acute kidney failure, unspecified: Secondary | ICD-10-CM | POA: Diagnosis present

## 2019-10-22 DIAGNOSIS — R1314 Dysphagia, pharyngoesophageal phase: Secondary | ICD-10-CM | POA: Diagnosis present

## 2019-10-22 HISTORY — PX: BIOPSY: SHX5522

## 2019-10-22 HISTORY — PX: ESOPHAGOGASTRODUODENOSCOPY (EGD) WITH PROPOFOL: SHX5813

## 2019-10-22 LAB — MAGNESIUM: Magnesium: 1.6 mg/dL — ABNORMAL LOW (ref 1.7–2.4)

## 2019-10-22 LAB — BASIC METABOLIC PANEL
Anion gap: 4 — ABNORMAL LOW (ref 5–15)
BUN: 11 mg/dL (ref 8–23)
CO2: 25 mmol/L (ref 22–32)
Calcium: 7.4 mg/dL — ABNORMAL LOW (ref 8.9–10.3)
Chloride: 107 mmol/L (ref 98–111)
Creatinine, Ser: 1.13 mg/dL — ABNORMAL HIGH (ref 0.44–1.00)
GFR calc Af Amer: 59 mL/min — ABNORMAL LOW (ref >60)
GFR calc non Af Amer: 51 mL/min — ABNORMAL LOW (ref >60)
Glucose, Bld: 77 mg/dL (ref 70–99)
Potassium: 3.7 mmol/L (ref 3.5–5.1)
Sodium: 136 mmol/L (ref 135–145)

## 2019-10-22 LAB — HIV ANTIBODY (ROUTINE TESTING W REFLEX): HIV Screen 4th Generation wRfx: NONREACTIVE

## 2019-10-22 LAB — C DIFFICILE QUICK SCREEN W PCR REFLEX
C Diff antigen: NEGATIVE
C Diff interpretation: NOT DETECTED
C Diff toxin: NEGATIVE

## 2019-10-22 LAB — LACTIC ACID, PLASMA: Lactic Acid, Venous: 1 mmol/L (ref 0.5–1.9)

## 2019-10-22 SURGERY — ESOPHAGOGASTRODUODENOSCOPY (EGD) WITH PROPOFOL
Anesthesia: Monitor Anesthesia Care

## 2019-10-22 MED ORDER — SODIUM CHLORIDE 0.9 % IV SOLN: INTRAVENOUS | Status: DC

## 2019-10-22 MED ORDER — PROPOFOL 500 MG/50ML IV EMUL
INTRAVENOUS | Status: DC | PRN
  Administered 2019-10-22: 100 ug/kg/min via INTRAVENOUS

## 2019-10-22 MED ORDER — PROPOFOL 10 MG/ML IV BOLUS
INTRAVENOUS | Status: DC | PRN
  Administered 2019-10-22: 20 mg via INTRAVENOUS

## 2019-10-22 MED ORDER — SODIUM CHLORIDE 0.9 % IV SOLN: INTRAVENOUS | Status: AC

## 2019-10-22 MED ORDER — LACTATED RINGERS IV SOLN: INTRAVENOUS | Status: DC | PRN

## 2019-10-22 SURGICAL SUPPLY — 14 items

## 2019-10-22 NOTE — Progress Notes (Signed)
PROGRESS NOTE    Kelly [name]  ZOX:[id number] DOB: June 09, 1955 DOA: 10/21/2019 PCP: Arvilla Market, DO  Brief Narrative: Chronically ill extremely cachectic 64 year old female with history of COPD, tobacco abuse, chronic diastolic CHF, CAD/PCI and stents, hypertension, dyslipidemia presented to the emergency room 8/20 with worsening weakness, nausea, dysphagia and odynophagia, poor oral intake for over 2 months, this has been slowly progressive.  She has lost about 25 pounds, denies any fevers or chills, continues to smoke a pack of cigarettes a day. -Work-up in the ED noted mild hypotension but improved with saline, severe hypokalemia CT chest concerning for interstitial lung disease and emphysema, CT abdomen suggestive of esophagitis and infectious/inflammatory colitis  Assessment & Plan:   Severe dysphagia/odynophagia Severe protein calorie malnutrition/weight loss Adult failure to thrive -Unable to eat and drink, swallowing progressively worsening for months, no overt findings concerning for malignancy based on CT chest/abdomen pelvis [facility] gastroenterology consulting, plan for endoscopy later today -Continue IV PPI, keep n.p.o. for now -Will consult dietitian as well  Possible colitis -Patient reports chronic diarrhea, could be more malabsorption -Started on ceftriaxone and Flagyl on admission last night -Follow-up C. 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 Heavy tobacco abuse -Concern for interstitial lung disease based on CT, no wheezing or oxygen requirement at this time -Counseled, continue nebs as needed -Recommended need for pulmonary follow-up  CAD History of PCI/stents -Stable  Chronic diastolic CHF -She is not volume overloaded at this time  DVT prophylaxis: Lovenox Code Status: Full code Family Communication: Discussed with patient in detail, no  family at bedside Disposition Plan:  Status is: Observation  The patient will require care spanning > 2 midnights and should be moved to inpatient because: Inpatient level of care appropriate due to severity of illness  Dispo: The patient is from: Home              Anticipated d/c is to: SNF              Anticipated d/c date is: > 3 days              Patient currently is not medically stable to d/c.  Consultants:   [facility]   Procedures:   Antimicrobials:    Subjective: -History of ongoing odynophagia and dysphagia for weeks to months, severe weight loss and failure to thrive  Objective: Vitals:   10/22/19 0303 10/22/19 0703 10/22/19 0826 10/22/19 1120  BP: 125/60 121/67  (!) 171/51  Pulse: 67 60  67  Resp: Temp: 98 F (36.7 C) 98 F (36.7 C)  98.7 F (37.1 C)  TempSrc: Oral Oral  Oral  SpO2: 98% 98%  99%  Height:    (1.651 m)     Intake/Output Summary (Last 24 hours) at 10/22/2019 1133 Last data filed at 10/22/2019 1000 Gross per 24 hour  Intake 1050.67 ml  Output 0 ml  Net 1050.67 ml   There were no vitals filed for this visit.  Examination:  General exam: Chronically ill extremely cachectic female appears much older than stated age, awake alert oriented to self, place and partly to time, mild cognitive deficits HEENT: No JVD CVS: S1-S2, regular rate rhythm Abdomen: Soft, nontender, bowel sounds present Extremities: No edema Neuro: Moves all extremities, no localizing findings  Data Reviewed:   CBC: Recent Labs  Lab 10/21/19 1355  WBC 17.8*  HGB 14.1  HCT  41.8  MCV 101.7*  PLT 171   Basic Metabolic Panel: Recent Labs  Lab 10/21/19 1355 10/21/19 1730 10/22/19 0041  NA 136  --  136  K 2.6*  --  3.7  CL 102  --  107  CO2 22  --  25  GLUCOSE 115*  --  77  BUN 12  --  11  CREATININE 1.14*  --  1.13*  CALCIUM 8.1*  --  7.4*  MG  --  1.6* 1.6*   GFR: CrCl cannot be calculated (Unknown ideal weight.). Liver  Function Tests: Recent Labs  Lab 10/21/19 1355  AST 10*  ALT 5  ALKPHOS 69  BILITOT 0.9  PROT 6.1*  ALBUMIN 2.5*   Recent Labs  Lab 10/21/19 1355  LIPASE 25   No results for input(s): AMMONIA in the last 168 hours. Coagulation Profile: No results for input(s): INR, PROTIME in the last 168 hours. Cardiac Enzymes: No results for input(s): CKTOTAL, CKMB, CKMBINDEX, TROPONINI in the last 168 hours. BNP (last 3 results) No results for input(s): PROBNP in the last 8760 hours. HbA1C: No results for input(s): HGBA1C in the last 72 hours. CBG: No results for input(s): GLUCAP in the last 168 hours. Lipid Profile: No results for input(s): CHOL, HDL, LDLCALC, TRIG, CHOLHDL, LDLDIRECT in the last 72 hours. Thyroid Function Tests: No results for input(s): TSH, T4TOTAL, FREET4, T3FREE, THYROIDAB in the last 72 hours. Anemia Panel: No results for input(s): VITAMINB12, FOLATE, FERRITIN, TIBC, IRON, RETICCTPCT in the last 72 hours. Urine analysis: No results found for: COLORURINE, APPEARANCEUR, LABSPEC, PHURINE, GLUCOSEU, HGBUR, BILIRUBINUR, KETONESUR, PROTEINUR, UROBILINOGEN, NITRITE, LEUKOCYTESUR Sepsis Labs: @LABRCNTIP (procalcitonin:4,lacticidven:4)  ) Recent Results (from the past 240 hour(s))  SARS Coronavirus 2 by RT PCR (hospital order, performed in [facility] hospital lab) Nasopharyngeal Nasopharyngeal Swab     Status: None   Collection Time: 10/21/19  5:01 PM   Specimen: Nasopharyngeal Swab  Result Value Ref Range Status   SARS Coronavirus 2 NEGATIVE NEGATIVE Final    Comment: (NOTE) SARS-CoV-2 target nucleic acids are NOT DETECTED.  The SARS-CoV-2 RNA is generally detectable in upper and lower respiratory specimens during the acute phase of infection. The lowest concentration of SARS-CoV-2 viral copies this assay can detect is 250 copies / mL. A negative result does not preclude SARS-CoV-2 infection and should not be used as the sole basis for treatment or other patient  management decisions.  A negative result may occur with improper specimen collection / handling, submission of specimen other than nasopharyngeal swab, presence of viral mutation(s) within the areas targeted by this assay, and inadequate number of viral copies (<250 copies / mL). A negative result must be combined with clinical observations, patient history, and epidemiological information.  Fact Sheet for Patients:   BoilerBrush.com.cy  Fact Sheet for Healthcare Providers: https://pope.com/  This test is not yet approved or  cleared by the [place] FDA and has been authorized for detection and/or diagnosis of SARS-CoV-2 by FDA under an Emergency Use Authorization (EUA).  This EUA will remain in effect (meaning this test can be used) for the duration of the COVID-19 declaration under Section 564(b)(1) of the Act, 21 U.S.C. section 360bbb-3(b)(1), unless the authorization is terminated or revoked sooner.  Performed at [facility], [address], [place], [place] [address]   Culture, blood (routine x 2)     Status: None (Preliminary result)   Collection Time: 10/22/19 12:47 AM   Specimen: BLOOD RIGHT HAND  Result Value Ref  Range Status   Specimen Description BLOOD RIGHT HAND  Final   Special Requests   Final    BOTTLES DRAWN AEROBIC ONLY Blood Culture adequate volume Performed at [facility], [address] [place], [place] [address]    Culture PENDING  Incomplete   Report Status PENDING  Incomplete         Radiology Studies: DG Chest 2 View  Result Date: 10/21/2019 CLINICAL DATA:  Chest pain for 2-3 months, congestion, weakness, history coronary artery disease, CHF, hypertension EXAM: CHEST - 2 VIEW COMPARISON:  05/02/2018 FINDINGS: Normal heart size, mediastinal contours, and pulmonary vascularity. Multiple coronary stents. Atherosclerotic calcification aorta. Lungs mildly hyperinflated but clear. No acute  infiltrate, pleural effusion or pneumothorax. Bones demineralized with scoliosis thoracic spine. IMPRESSION: Hyperinflated lungs without infiltrate. Electronically Signed   By: Ulyses Southward M.D.   On: 10/21/2019 14:17   CT CHEST ABDOMEN PELVIS W CONTRAST  Result Date: 10/21/2019 CLINICAL DATA:  Generalized weakness, weight loss EXAM: CT CHEST, ABDOMEN, AND PELVIS WITH CONTRAST TECHNIQUE: Multidetector CT imaging of the chest, abdomen and pelvis was performed following the standard protocol during bolus administration of intravenous contrast. CONTRAST:  OMNIPAQUE IOHEXOL 300 MG/ML  SOLN COMPARISON:  CT 10/22/2018, radiograph 10/21/2019 FINDINGS: CT CHEST FINDINGS Cardiovascular: The aortic root is suboptimally assessed given cardiac pulsation artifact. Atherosclerotic plaque within the normal caliber aorta. No acute luminal abnormality. No periaortic stranding or hemorrhage. Shared origin of the brachiocephalic and left common carotid artery. Proximal great vessels demonstrate atheromatous plaque including calcification at the left vertebral artery ostium incompletely assessed on this non angiographic technique. Central pulmonary arteries are borderline enlarged. No large central or lobar filling defects on this non tailored examination. Cardiac size within normal limits. Three-vessel coronary artery disease is noted. No pericardial effusion. No major venous anomalies. Mediastinum/Nodes: No mediastinal fluid or gas. Postsurgical changes likely reflecting prior left hemithyroidectomy with a normal appearance of right lobe thyroid gland. No acute abnormality of the trachea. Mild circumferential thickening and mucosal hyperemia of the mid to distal thoracic esophagus with small amount of fluid the level of the mid esophagus. No worrisome mediastinal, hilar or axillary adenopathy. Lungs/Pleura: Nonspecific ill-defined centrilobular micro nodularity distributed throughout the bilateral upper lobes/apices as well  as further areas of ground-glass opacity with a mid to upper lung predilection. Some mild diffuse airways thickening and scattered secretions are noted as well as a background of mild centrilobular emphysematous change. No focal consolidative opacity is evident. No pneumothorax. No other concerning nodularity or pulmonary masses. Musculoskeletal: The osseous structures appear diffusely demineralized which may limit detection of small or nondisplaced fractures. No acute osseous abnormality or suspicious osseous lesion. Mild chest wall deformity associated with the severe dextrocurvature of the thoracolumbar spine. Multilevel degenerative changes in the spine. Milder degenerative changes in the bilateral shoulders. No acute or worrisome osseous lesions are evident. CT ABDOMEN PELVIS FINDINGS Hepatobiliary: Mildly heterogenous hepatic parenchymal enhancement. Small normal hepatic attenuation and smooth liver surface contour seen on the delayed phase imaging. No visible focal liver lesions. Mild intra and extrahepatic biliary ductal dilatation is possibly related to reservoir effect given the postsurgical changes from prior cholecystectomy and similarity to comparison exam without visible intraductal gallstones. Pancreas: Unremarkable. No pancreatic ductal dilatation or surrounding inflammatory changes. Spleen: Normal in size. No concerning splenic lesions. Adrenals/Urinary Tract: Lobular thickening of the bilateral adrenal glands. No concerning dominant adrenal nodules. Mild bilateral cortical thinning both kidneys which otherwise enhance and excrete symmetrically. Nonspecific mild bilateral perinephric  stranding is also unchanged from prior and is a nonspecific finding. No concerning renal masses. No urolithiasis or hydronephrosis. Urinary bladder is unremarkable. Stomach/Bowel: Thickened distal thoracic esophagus. Postsurgical changes at the GE junction may reflect prior fundoplication/wrap procedure. Duodenum with a  normal course across the midline abdomen. No small bowel thickening or dilatation is evident. A normal appendix is visualized. Diffuse pancolonic edematous mural thickening and mucosal hyperemia with faint pericolonic hazy stranding. Lack of formed stool in the colon. Fluid-filled rectum noted as well. Serpentine hyperenhancement towards the rectum likely reflecting hemorrhoidal enhancement. Vascular/Lymphatic: Atherosclerotic calcifications within the abdominal aorta and branch vessels. No aneurysm or ectasia. Paired renal arteries bilaterally. Atheromatous ostial narrowing at the SMA, IMA and renal artery origins. No enlarged abdominopelvic lymph nodes. Reproductive: Mildly retroverted uterus. No concerning adnexal lesions. Previously seen cystic lesion in the right ovary no longer readily apparent with quiescent appearance of the ovarian tissue. Other: Circumferential body wall edema. Suspect some mild superficial ulceration adjacent the bilateral greater trochanters and left ischial tuberosity without organized collection or abscess. No abdominopelvic free air or fluid. Mild body wall edema. No bowel containing hernias. Musculoskeletal: Diffuse muscular atrophy. Severe dextrocurvature of the thoracolumbar spine with apex at the L2-3 level and evidence of prior L3-L5 posterior spinal fusion and interbody cage placement. No evidence of hardware complication. Prior right iliac group bone harvest site. The osseous structures appear diffusely demineralized which may limit detection of small or nondisplaced fractures. No acute or suspicious osseous lesions are evident at this time. Multilevel degenerative changes in the spine. Additional degenerative changes in the hips and pelvis. IMPRESSION: 1. Nonspecific ill-defined centrilobular micro nodularity distributed throughout the bilateral upper lobes/apices as well as further areas of ground-glass opacity with a mid to upper lung predilection. Findings are suggestive of  a potential interstitial process such as RB I LD particularly in this patient with current and extensive smoking history. 2. Background of mild centrilobular emphysema ( Emphysema (ICD10-J43.9).) 3. Mild circumferential thickening and mucosal hyperemia of the mid to distal thoracic esophagus with small amount of fluid the level of the mid esophagus, suggestive of esophagitis. Consider correlation with outpatient endoscopy. 4. Diffuse pancolonic edematous mural thickening and mucosal hyperemia with faint pericolonic hazy stranding. Lack of formed stool in the colon. Serpentine hyperenhancement towards the rectum likely reflecting hemorrhoidal enhancement. Findings are suggestive of infectious or inflammatory colitis with rapid transit state. 5. Mildly heterogenous hepatic parenchymal enhancement, nonspecific but can be seen with hepatitis. Correlate with LFTs. 6. Biliary ductal prominence likely related to post cholecystectomy reservoir effect given stability and lack of visible intraductal gallstones. 7. Severe dextrocurvature of the thoracolumbar spine with apex at the L2-3 level and evidence of prior L3-L5 posterior spinal fusion and interbody cage placement. No evidence of hardware complication.Mild associated chest wall deformity. 8. Suspect some mild superficial ulceration adjacent the bilateral greater trochanters and left ischial tuberosity without organized collection or abscess. Correlate with visual inspection. 9. Three-vessel coronary artery disease. 10. Aortic Atherosclerosis (ICD10-I70.0). Ostial narrowing at the IMA, SMA and renal artery origins. Electronically Signed   By: Kreg Shropshire M.D.   On: 10/21/2019 19:35        Scheduled Meds:  enoxaparin (LOVENOX) injection  40 mg Subcutaneous Q24H   levothyroxine  12.5 mcg Intravenous Daily   nicotine  21 mg Transdermal Daily   pantoprazole (PROTONIX) IV  40 mg Intravenous Q24H   Continuous Infusions:  cefTRIAXone (ROCEPHIN)  IV 2 g  (10/22/19 0006)   metronidazole  LOS: 0 days    Time spent:  Zannie Cove, MD [facility]  10/22/2019, 11:33 AM

## 2019-10-22 NOTE — Evaluation (Signed)
Physical Therapy Evaluation Patient Details Name: Kelly [name] MRN: [id number] DOB: Nov 02, 1955 Today's Date: 10/22/2019   History of Present Illness  64 yo female presenting to ED with weakness, nausea, and poor oral intake. CT chest concerning for interstitial lung disease and emphysema, CT abdomen suggestive of esophagitis and infectious/inflammatory colitis. PMH including COPD, tobacco abuse, chronic diastolic CHF, CAD/PCI and stents, HTN, and dyslipidemia.    Clinical Impression  Pt in bed upon arrival of PT, agreeable to evaluation at this time. Prior to admission the pt reports she was mostly bed bound during the day with furniture walking to get to the bathroom. The pt now presents with limitations in functional mobility, strength due to above dx, and will continue to benefit from skilled PT to address these deficits. The pt was limited to a short ambulation in the room due to reports of fatigue with mobility, and declined any further ambulation or stair training at this time. The pt was educated in a series of exercises for her LE and postural muscles to improve functional strength and mobility outside of PT sessions and was able to demo good technique without cues. The pt will continue to benefit from skilled PT to progress functional endurance and activity tolerance as well as strength and stair training to allow the pt to complete the necessary stairs to reach her bed/bathroom in the home.      Follow Up Recommendations SNF;Supervision for mobility/OOB    Equipment Recommendations  Rolling walker with 5" wheels    Recommendations for Other Services       Precautions / Restrictions Precautions Precautions: Fall Restrictions Weight Bearing Restrictions: No      Mobility  Bed Mobility Overal bed mobility: Needs Assistance Bed Mobility: Supine to Sit     Supine to sit: Supervision;HOB elevated     General bed mobility comments: Supervision for safety. No phsyical A needed. pt  with heavy use of bed rail and elevated HOB  Transfers Overall transfer level: Needs assistance Equipment used: Rolling walker (2 wheeled) Transfers: Sit to/from Stand Sit to Stand: Min guard         General transfer comment: minG with significant extra time to rise to standing. no assist to steady in stance, but BUE supported  Ambulation/Gait Ambulation/Gait assistance: Min assist Gait Distance (Feet): 5 Feet Assistive device: Rolling walker (2 wheeled) Gait Pattern/deviations: Step-to pattern;Decreased stride length;Shuffle;Trunk flexed Gait velocity: decreased Gait velocity interpretation: <1.31 ft/sec, indicative of household ambulator General Gait Details: Pt with significant trunk flexion (chest only 4-6 inches from handles of RW). pt reports this is her normal stance due to a "back surgery gone wrong" many years ago. The pt was able to take very small, shuffling steps to the recliner without LOB, minA to facilitate movement of RW  Stairs Stairs:  (the pt has steps to enter but was unable to complete today due to reports of fatigue)          Wheelchair Mobility    Modified Rankin (Stroke Patients Only)       Balance Overall balance assessment: Needs assistance Sitting-balance support: No upper extremity supported;Feet supported Sitting balance-Leahy Scale: Good     Standing balance support: During functional activity;Bilateral upper extremity supported Standing balance-Leahy Scale: Poor Standing balance comment: Reliant on UE support                             Pertinent Vitals/Pain Pain Assessment: Faces Pain Score: 5  Faces Pain Scale: Hurts a little bit Pain Location: stiffness, general Pain Intervention(s): Limited activity within patient's tolerance;Monitored during session;Repositioned    Home Living Family/patient expects to be discharged to:: Private residence Living Arrangements: Non-relatives/Friends Available Help at Discharge:  Friend(s);Available 24 hours/day Type of Home: House Home Access: Stairs to enter Entrance Stairs-Rails: Can reach both Entrance Stairs-Number of Steps: 4 Home Layout: Two level;Bed/bath upstairs Home Equipment: Shower seat;Wheelchair - manual;Grab bars - tub/shower;Hand held shower head      Prior Function Level of Independence: Independent         Comments: pt reports staying [facility] for 6-8 months due due concerns for instability on stairs. Homeowner does the grocery shopping, roommates grandson compeltes all other errands     Hand Dominance   Dominant Hand: Right    Extremity/Trunk Assessment   Upper Extremity Assessment Upper Extremity Assessment: Generalized weakness    Lower Extremity Assessment Lower Extremity Assessment: Generalized weakness    Cervical / Trunk Assessment Cervical / Trunk Assessment: Kyphotic  Communication   Communication: No difficulties  Cognition Arousal/Alertness: Awake/alert Behavior During Therapy: WFL for tasks assessed/performed Overall Cognitive Status: Within Functional Limits for tasks assessed                                 General Comments: minor discrepencies between history taken this session and at previous OT session. Pt answers and questions were otherwise appropriate      General Comments General comments (skin integrity, edema, etc.): IV site still with some blood, RN notified IV occluded. Pt desiring to return home but unable to demo stairs today due to reoports of fatigue.    Exercises General Exercises - Lower Extremity Long Arc Quad: AROM;Both;10 reps;Seated Straight Leg Raises: AROM;Seated;Both;5 reps Other Exercises Other Exercises: scapular retractions: 2 x 10 seated in recliner   Assessment/Plan    PT Assessment Patient needs continued PT services  PT Problem List Decreased strength;Decreased mobility;Decreased activity tolerance;Decreased balance       PT Treatment Interventions DME  instruction;Therapeutic exercise;Gait training;Stair training;Patient/family education;Functional mobility training;Therapeutic activities;Balance training    PT Goals (Current goals can be found in the Care Plan section)  Acute Rehab PT Goals Patient Stated Goal: Go home PT Goal Formulation: With patient Time For Goal Achievement: 11/05/19 Potential to Achieve Goals: Good    Frequency Min 3X/week   Barriers to discharge Inaccessible home environment pt has to navigate a flight of stairs to return to her bed/bathroom       AM-PAC PT "6 Clicks" Mobility  Outcome Measure Help needed turning from your back to your side while in a flat bed without using bedrails?: None Help needed moving from lying on your back to sitting on the side of a flat bed without using bedrails?: A Little Help needed moving to and from a bed to a chair (including a wheelchair)?: A Little Help needed standing up from a chair using your arms (e.g., wheelchair or bedside chair)?: A Little Help needed to walk in hospital room?: A Little Help needed climbing 3-5 steps with a railing? : A Lot 6 Click Score: 18    End of Session Equipment Utilized During Treatment: Gait belt Activity Tolerance: Patient tolerated treatment well;Patient limited by fatigue Patient left: in chair;with chair alarm set;with call bell/phone within reach Nurse Communication: Mobility status (IV beeping (occluded)) PT Visit Diagnosis: Muscle weakness (generalized) (M62.81);Difficulty in walking, not elsewhere classified (R26.2);History of falling (Z91.81)  Time: 4765-4650 PT Time Calculation (min) (ACUTE ONLY): 27 min   Charges:   PT Evaluation $PT Eval Moderate Complexity: 1 Mod PT Treatments $Gait Training: 8-22 mins        Rolm Baptise, PT, DPT   Acute Rehabilitation Department Pager #: [phone]  Gaetana Michaelis 10/22/2019, 2:34 PM

## 2019-10-22 NOTE — Consult Note (Signed)
Referring Provider:  Dr. Mitchel Honour [facility]) Primary Care Physician:  Arvilla Market, DO Primary Gastroenterologist: None (unassigned)  Reason for Consultation: Dysphagia  HPI: Kelly [name] is a 64 y.o. female with a longstanding history of intermittent dysphagia requiring periodic esophageal dilatations in [place], [place] where she resided until several years ago.  For the past several weeks, however, her dysphagia symptoms have been more consistent and she has been essentially unable to eat, due to inability to swallow, for the past week or so.  She is unclear as to how much weight she has lost.  She is on pantoprazole as an outpatient.  The patient indicates that, for the past 8 months or so, she has been essentially bed confined due to weakness, getting up to go to the bathroom while holding onto furniture.  She does have a history of coronary artery disease, CHF, and COPD.  She is not on home oxygen and she continues to smoke.  The patient had a CT on this admission which shows evidence of mild distal esophageal inflammation characterized by mucosal enhancement and mural thickening.  No evidence of adenopathy or cancer in the abdomen.  There is a question of colonic mucosal hyperenhancement although the patient did not complain to me of diarrhea.  The patient states she had colonoscopy several years ago in [place].     Past Medical History:  Diagnosis Date  . CAD (coronary artery disease)    5 stents in the past, 4 in [facility], 1 in [place] [facility]. Last cath 01/2017  . CHF (congestive heart failure) (HCC)   . 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 Hyperlipidemia LDL goal <70   . Hypertension     Past Surgical History:  Procedure Laterality Date  . CARDIAC CATHETERIZATION     stents x 5    Prior to Admission medications   Medication Sig Start Date End Date Taking? Authorizing Provider   citalopram (CELEXA) 20 MG tablet Take 1 tablet (20 mg total) by mouth daily. Patient taking differently: Take 20 mg by mouth at bedtime.  09/14/19  Yes Arvilla Market, DO  diclofenac Sodium (VOLTAREN) 1 % GEL Apply 4 g topically 4 (four) times daily. Patient taking differently: Apply 4 g topically 4 (four) times daily as needed (pain).  09/14/19  Yes Arvilla Market, DO  levothyroxine (EUTHYROX) 25 MCG tablet TAKE 1 TABLET BY MOUTH ONCE DAILY BEFORE BREAKFAST Patient taking differently: Take 25 mcg by mouth daily before breakfast.  09/14/19  Yes Arvilla Market, DO  losartan (COZAAR) 25 MG tablet Take 1 tablet (25 mg total) by mouth daily. Patient taking differently: Take 25 mg by mouth at bedtime.  09/14/19  Yes Arvilla Market, DO  metoCLOPramide (REGLAN) 10 MG tablet Take 1 tablet (10 mg total) by mouth 2 (two) times daily as needed for nausea. 09/14/19  Yes Arvilla Market, DO  nitroGLYCERIN (NITROSTAT) 0.4 MG SL tablet Place 0.4 mg under the tongue every 5 (five) minutes as needed for chest pain.   Yes [provider]  ondansetron (ZOFRAN ODT) 4 MG disintegrating tablet Take 1 tablet (4 mg total) by mouth every 8 (eight) hours as needed for nausea or vomiting. 09/14/19  Yes Arvilla Market, DO  pantoprazole (PROTONIX) 40 MG tablet Take 1 tablet (40 mg total) by mouth daily. 09/14/19  Yes Arvilla Market, DO  pregabalin (LYRICA) 75 MG capsule Take 1 capsule (  75 mg total) by mouth 2 (two) times daily. 09/14/19  Yes Arvilla Market, DO  topiramate (TOPAMAX) 25 MG tablet Take 2 tablets (50 mg total) by mouth daily. Patient taking differently: Take 25 mg by mouth 2 (two) times daily.  09/14/19  Yes Arvilla Market, DO  metoprolol succinate (TOPROL-XL) 25 MG 24 hr tablet Take 0.5 tablets (12.5 mg total) by mouth daily. Patient not taking: Reported on 10/21/2019 09/14/19   Arvilla Market, DO   rosuvastatin (CRESTOR) 5 MG tablet Take 1 tablet (5 mg total) by mouth daily. Patient not taking: Reported on 10/21/2019 08/23/19   Arvilla Market, DO    Current Facility-Administered Medications  Medication Dose Route Frequency Provider Last Rate Last Admin  . acetaminophen (TYLENOL) tablet 650 mg  650 mg Oral Q6H PRN John Giovanni, MD       Or  . acetaminophen (TYLENOL) suppository 650 mg  650 mg Rectal Q6H PRN John Giovanni, MD      . cefTRIAXone (ROCEPHIN) 2 g in sodium chloride 0.9 % 100 mL IVPB  2 g Intravenous Q24H John Giovanni, MD 200 mL/hr at 10/22/19 0006 2 g at 10/22/19 0006  . enoxaparin (LOVENOX) injection 40 mg  40 mg Subcutaneous Q24H John Giovanni, MD   40 mg at 10/22/19 0006  . levothyroxine (SYNTHROID, LEVOTHROID) injection 12.5 mcg  12.5 mcg Intravenous Daily John Giovanni, MD   12.5 mcg at 10/22/19 0944  . metroNIDAZOLE (FLAGYL) IVPB 500 mg  500 mg Intravenous Q8H John Giovanni, MD 100 mL/hr at 10/22/19 1211 500 mg at 10/22/19 1211  . nicotine (NICODERM CQ - dosed in mg/24 hours) patch 21 mg  21 mg Transdermal Daily John Giovanni, MD   21 mg at 10/22/19 0944  . pantoprazole (PROTONIX) injection 40 mg  40 mg Intravenous Q24H John Giovanni, MD        Allergies as of 10/21/2019 - Review Complete 10/21/2019  Allergen Reaction Noted  . Morphine and related Nausea And Vomiting 11/17/2017  . Other Other (See Comments) 05/02/2018    Family History  Problem Relation Age of Onset  . Heart disease Mother        stents x 8 in her life. Passed away in 07-22-2014  . Alzheimer's disease Mother   . Alzheimer's disease Father     Social History   Socioeconomic History  . Marital status: Divorced    Spouse name: Not on file  . Number of children: Not on file  . Years of education: Not on file  . Highest education level: Not on file  Occupational History  . Not on file  Tobacco Use  . Smoking status: Current Every Day Smoker     Packs/day: 1.00    Years: 50.00    Pack years: 50.00  . Smokeless tobacco: Never Used  Substance and Sexual Activity  . 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 Drug use: Yes    Types: Marijuana  . Sexual activity: Not on file  Other Topics Concern  . Not on file  Social History Narrative  . Not on file   Social Determinants of Health   Financial Resource Strain:   . Difficulty of Paying Living Expenses: Not on file  Food Insecurity:   . Worried About Programme researcher, broadcasting/film/video in the Last Year: Not on file  . Ran Out of Food in the Last Year: Not on file  Transportation Needs:   .  Lack of Transportation (Medical): Not on file  . Lack of Transportation (Non-Medical): Not on file  Physical Activity:   . Days of Exercise per Week: Not on file  . Minutes of Exercise per Session: Not on file  Stress:   . Feeling of Stress : Not on file  Social Connections:   . Frequency of Communication with Friends and Family: Not on file  . Frequency of Social Gatherings with Friends and Family: Not on file  . Attends Religious Services: Not on file  . Active Member of Clubs or Organizations: Not on file  . Attends Banker Meetings: Not on file  . Marital Status: Not on file  Intimate Partner Violence:   . Fear of Current or Ex-Partner: Not on file  . Emotionally Abused: Not on file  . Physically Abused: Not on file  . Sexually Abused: Not on file    Review of Systems: See HPI.  Does not use an inhaler.  Denies alcohol, does use THC.  Physical Exam: Vital signs in last 24 hours: Temp:  [98 F (36.7 C)-98.7 F (37.1 C)] 98.7 F (37.1 C) (08/21 1120) Pulse Rate:  [60-95] 67 (08/21 1120) Resp:  [13-21] 16 (08/21 1120) BP: (88-171)/(43-70) 171/51 (08/21 1120) SpO2:  [90 %-100 %] 99 % (08/21 1120) Last BM Date: 10/21/19  This is a chronically ill-appearing female who looks quite a bit older than her age, lying in bed in no  distress, particularly no dyspnea.  She appears to have nicotine stains on her finger.  The chest has decreased vesicular breath sounds but some air movement is heard, with a few scattered rhonchi, no rales.  Heart is normal, no murmur or arrhythmia.  Abdomen is nondistended, soft and nontender, without organomegaly or masses.  Skin is without overt jaundice or pallor.  Extremities are without edema.  The patient is semicontractured in bed.  No obvious focal neurologic deficits.  She appears to be quite cognitively intact.  Intake/Output from previous day: 08/20 0701 - 08/21 0700 In: 1050.7 [I.V.:414.4; IV Piggyback:636.3] Out: -  Intake/Output this shift: No intake/output data recorded.  Lab Results: Recent Labs    10/21/19 1355  WBC 17.8*  HGB 14.1  HCT 41.8  PLT 171   BMET Recent Labs    10/21/19 1355 10/22/19 0041  NA 136 136  K 2.6* 3.7  CL 102 107  CO2 22 25  GLUCOSE 115* 77  BUN 12 11  CREATININE 1.14* 1.13*  CALCIUM 8.1* 7.4*   LFT Recent Labs    10/21/19 1355  PROT 6.1*  ALBUMIN 2.5*  AST 10*  ALT 5  ALKPHOS 69  BILITOT 0.9  BILIDIR 0.3*  IBILI 0.6   PT/INR No results for input(s): LABPROT, INR in the last 72 hours.  Studies/Results: DG Chest 2 View  Result Date: 10/21/2019 CLINICAL DATA:  Chest pain for 2-3 months, congestion, weakness, history coronary artery disease, CHF, hypertension EXAM: CHEST - 2 VIEW COMPARISON:  05/02/2018 FINDINGS: Normal heart size, mediastinal contours, and pulmonary vascularity. Multiple coronary stents. Atherosclerotic calcification aorta. Lungs mildly hyperinflated but clear. No acute infiltrate, pleural effusion or pneumothorax. Bones demineralized with scoliosis thoracic spine. IMPRESSION: Hyperinflated lungs without infiltrate. Electronically Signed   By: Ulyses Southward M.D.   On: 10/21/2019 14:17   CT CHEST ABDOMEN PELVIS W CONTRAST  Result Date: 10/21/2019 CLINICAL DATA:  Generalized weakness, weight loss EXAM: CT  CHEST, ABDOMEN, AND PELVIS WITH CONTRAST TECHNIQUE: Multidetector CT imaging of the chest, abdomen and  pelvis was performed following the standard protocol during bolus administration of intravenous contrast. CONTRAST:  OMNIPAQUE IOHEXOL 300 MG/ML  SOLN COMPARISON:  CT 10/22/2018, radiograph 10/21/2019 FINDINGS: CT CHEST FINDINGS Cardiovascular: The aortic root is suboptimally assessed given cardiac pulsation artifact. Atherosclerotic plaque within the normal caliber aorta. No acute luminal abnormality. No periaortic stranding or hemorrhage. Shared origin of the brachiocephalic and left common carotid artery. Proximal great vessels demonstrate atheromatous plaque including calcification at the left vertebral artery ostium incompletely assessed on this non angiographic technique. Central pulmonary arteries are borderline enlarged. No large central or lobar filling defects on this non tailored examination. Cardiac size within normal limits. Three-vessel coronary artery disease is noted. No pericardial effusion. No major venous anomalies. Mediastinum/Nodes: No mediastinal fluid or gas. Postsurgical changes likely reflecting prior left hemithyroidectomy with a normal appearance of right lobe thyroid gland. No acute abnormality of the trachea. Mild circumferential thickening and mucosal hyperemia of the mid to distal thoracic esophagus with small amount of fluid the level of the mid esophagus. No worrisome mediastinal, hilar or axillary adenopathy. Lungs/Pleura: Nonspecific ill-defined centrilobular micro nodularity distributed throughout the bilateral upper lobes/apices as well as further areas of ground-glass opacity with a mid to upper lung predilection. Some mild diffuse airways thickening and scattered secretions are noted as well as a background of mild centrilobular emphysematous change. No focal consolidative opacity is evident. No pneumothorax. No other concerning nodularity or pulmonary masses.  Musculoskeletal: The osseous structures appear diffusely demineralized which may limit detection of small or nondisplaced fractures. No acute osseous abnormality or suspicious osseous lesion. Mild chest wall deformity associated with the severe dextrocurvature of the thoracolumbar spine. Multilevel degenerative changes in the spine. Milder degenerative changes in the bilateral shoulders. No acute or worrisome osseous lesions are evident. CT ABDOMEN PELVIS FINDINGS Hepatobiliary: Mildly heterogenous hepatic parenchymal enhancement. Small normal hepatic attenuation and smooth liver surface contour seen on the delayed phase imaging. No visible focal liver lesions. Mild intra and extrahepatic biliary ductal dilatation is possibly related to reservoir effect given the postsurgical changes from prior cholecystectomy and similarity to comparison exam without visible intraductal gallstones. Pancreas: Unremarkable. No pancreatic ductal dilatation or surrounding inflammatory changes. Spleen: Normal in size. No concerning splenic lesions. Adrenals/Urinary Tract: Lobular thickening of the bilateral adrenal glands. No concerning dominant adrenal nodules. Mild bilateral cortical thinning both kidneys which otherwise enhance and excrete symmetrically. Nonspecific mild bilateral perinephric stranding is also unchanged from prior and is a nonspecific finding. No concerning renal masses. No urolithiasis or hydronephrosis. Urinary bladder is unremarkable. Stomach/Bowel: Thickened distal thoracic esophagus. Postsurgical changes at the GE junction may reflect prior fundoplication/wrap procedure. Duodenum with a normal course across the midline abdomen. No small bowel thickening or dilatation is evident. A normal appendix is visualized. Diffuse pancolonic edematous mural thickening and mucosal hyperemia with faint pericolonic hazy stranding. Lack of formed stool in the colon. Fluid-filled rectum noted as well. Serpentine hyperenhancement  towards the rectum likely reflecting hemorrhoidal enhancement. Vascular/Lymphatic: Atherosclerotic calcifications within the abdominal aorta and branch vessels. No aneurysm or ectasia. Paired renal arteries bilaterally. Atheromatous ostial narrowing at the SMA, IMA and renal artery origins. No enlarged abdominopelvic lymph nodes. Reproductive: Mildly retroverted uterus. No concerning adnexal lesions. Previously seen cystic lesion in the right ovary no longer readily apparent with quiescent appearance of the ovarian tissue. Other: Circumferential body wall edema. Suspect some mild superficial ulceration adjacent the bilateral greater trochanters and left ischial tuberosity without organized collection or abscess. No abdominopelvic free air or fluid.  Mild body wall edema. No bowel containing hernias. Musculoskeletal: Diffuse muscular atrophy. Severe dextrocurvature of the thoracolumbar spine with apex at the L2-3 level and evidence of prior L3-L5 posterior spinal fusion and interbody cage placement. No evidence of hardware complication. Prior right iliac group bone harvest site. The osseous structures appear diffusely demineralized which may limit detection of small or nondisplaced fractures. No acute or suspicious osseous lesions are evident at this time. Multilevel degenerative changes in the spine. Additional degenerative changes in the hips and pelvis. IMPRESSION: 1. Nonspecific ill-defined centrilobular micro nodularity distributed throughout the bilateral upper lobes/apices as well as further areas of ground-glass opacity with a mid to upper lung predilection. Findings are suggestive of a potential interstitial process such as RB I LD particularly in this patient with current and extensive smoking history. 2. Background of mild centrilobular emphysema ( Emphysema (ICD10-J43.9).) 3. Mild circumferential thickening and mucosal hyperemia of the mid to distal thoracic esophagus with small amount of fluid the level  of the mid esophagus, suggestive of esophagitis. Consider correlation with outpatient endoscopy. 4. Diffuse pancolonic edematous mural thickening and mucosal hyperemia with faint pericolonic hazy stranding. Lack of formed stool in the colon. Serpentine hyperenhancement towards the rectum likely reflecting hemorrhoidal enhancement. Findings are suggestive of infectious or inflammatory colitis with rapid transit state. 5. Mildly heterogenous hepatic parenchymal enhancement, nonspecific but can be seen with hepatitis. Correlate with LFTs. 6. Biliary ductal prominence likely related to post cholecystectomy reservoir effect given stability and lack of visible intraductal gallstones. 7. Severe dextrocurvature of the thoracolumbar spine with apex at the L2-3 level and evidence of prior L3-L5 posterior spinal fusion and interbody cage placement. No evidence of hardware complication.Mild associated chest wall deformity. 8. Suspect some mild superficial ulceration adjacent the bilateral greater trochanters and left ischial tuberosity without organized collection or abscess. Correlate with visual inspection. 9. Three-vessel coronary artery disease. 10. Aortic Atherosclerosis (ICD10-I70.0). Ostial narrowing at the IMA, SMA and renal artery origins. Electronically Signed   By: Kreg Shropshire M.D.   On: 10/21/2019 19:35    Impression: 1.  Dysphagia, acute on chronic, severe 2.  Questionable distal esophagitis on CT 3.  Possible colitis by CT without compatible clinical manifestations 4.  COPD, CAD, generalized deconditioning with essentially bed confined status  Plan: Endoscopic evaluation this afternoon, with possible esophageal dilatation.  The nature, purpose, and risks of endoscopy with possible dilatation were discussed with the patient, including anesthesia risk which is increased because of her COPD and her heart disease, and perforation risk in the setting of esophageal dilatation which, in her case, could  potentially be fatal because of her general poor medical condition.  The patient is nonetheless very agreeable to endoscopic evaluation and dilatation if needed; she wants relief so she can eat.   LOS: 0 days   Katy Fitch  [place]  10/22/2019, 12:45 PM   Pager [phone] If no answer or after 5 PM call [phone]

## 2019-10-22 NOTE — Anesthesia Procedure Notes (Signed)
Procedure Name: MAC Date/Time: 10/22/2019 3:14 PM Performed by: Inda Coke, CRNA Pre-anesthesia Checklist: Patient identified, Emergency Drugs available, Suction available, Timeout performed and Patient being monitored Patient Re-evaluated:Patient Re-evaluated prior to induction Oxygen Delivery Method: Nasal cannula Induction Type: IV induction Dental Injury: Teeth and Oropharynx as per pre-operative assessment

## 2019-10-22 NOTE — Progress Notes (Signed)
SLP Cancellation Note  Patient Details Name: Kelly [name] MRN: [id number] DOB: [birth year]   Cancelled treatment:       Reason Eval/Treat Not Completed: Other (comment) (MD would like for patient to be seen by GI first. )  Ferdinand Lango MA, CCC-SLP    [name] Meryl 10/22/2019, 9:10 AM

## 2019-10-22 NOTE — Anesthesia Preprocedure Evaluation (Addendum)
Anesthesia Evaluation  Patient identified by MRN, date of birth, ID band Patient awake    Reviewed: Allergy & Precautions, H&P , NPO status , Patient's Chart, lab work & pertinent test results, reviewed documented beta blocker date and time   Airway Mallampati: I  TM Distance: >3 FB Neck ROM: Full    Dental no notable dental hx. (+) Edentulous Upper, Dental Advisory Given, Edentulous Lower   Pulmonary COPD, Current Smoker and Patient abstained from smoking.,    Pulmonary exam normal breath sounds clear to auscultation       Cardiovascular hypertension, Pt. on medications and Pt. on home beta blockers + CAD, + Cardiac Stents and +CHF   Rhythm:Regular Rate:Normal     Neuro/Psych  Headaches, negative psych ROS   GI/Hepatic negative GI ROS, Neg liver ROS,   Endo/Other  negative endocrine ROS  Renal/GU negative Renal ROS  negative genitourinary   Musculoskeletal  (+) Fibromyalgia -  Abdominal   Peds  Hematology negative hematology ROS (+)   Anesthesia Other Findings   Reproductive/Obstetrics negative OB ROS                            Anesthesia Physical Anesthesia Plan  ASA: III  Anesthesia Plan: MAC   Post-op Pain Management:    Induction: Intravenous  PONV Risk Score and Plan: 1 and Propofol infusion  Airway Management Planned: Nasal Cannula  Additional Equipment:   Intra-op Plan:   Post-operative Plan:   Informed Consent: I have reviewed the patients History and Physical, chart, labs and discussed the procedure including the risks, benefits and alternatives for the proposed anesthesia with the patient or authorized representative who has indicated his/her understanding and acceptance.     Dental advisory given  Plan Discussed with: CRNA  Anesthesia Plan Comments:         Anesthesia Quick Evaluation

## 2019-10-22 NOTE — Progress Notes (Signed)
Reviewed GI notes. Plans for possible endoscopy this afternoon. Will plan to f/u next date.   Charly Hunton MA, CCC-SLP

## 2019-10-22 NOTE — Op Note (Signed)
[facility] Patient Name: Kelly [name] Procedure Date : 10/22/2019 MRN: [id number] Attending MD: Bernette Redbird , MD Date of Birth: January 11, 1956 CSN: [id number] Age: 64 Admit Type: Inpatient Procedure:                Upper GI endoscopy Indications:              Dysphagia, Weight loss Providers:                Bernette Redbird, MD, Blenda Mounts, RN, Arlee Muslim Tech., Technician, Brion Aliment,                            Technician, Little Ishikawa, CRNA Referring MD:              Medicines:                Monitored Anesthesia Care Complications:            No immediate complications. Estimated Blood Loss:     Estimated blood loss was minimal. Procedure:                Pre-Anesthesia Assessment:                           - Prior to the procedure, a History and Physical                            was performed, and patient medications and                            allergies were reviewed. The patient's tolerance of                            previous anesthesia was also reviewed. The risks                            and benefits of the procedure and the sedation                            options and risks were discussed with the patient.                            All questions were answered, and informed consent                            was obtained. Prior Anticoagulants: The patient has                            taken no previous anticoagulant or antiplatelet                            agents. ASA Grade Assessment: III - A patient with  severe systemic disease. After reviewing the risks                            and benefits, the patient was deemed in                            satisfactory condition to undergo the procedure.                           After obtaining informed consent, the endoscope was                            passed under direct vision. Throughout the                            procedure, the  patient's blood pressure, pulse, and                            oxygen saturations were monitored continuously. The                            GIF-H190 ([id number] Olympus gastroscope was                            introduced through the mouth, and advanced to the                            second part of duodenum. The upper GI endoscopy was                            accomplished without difficulty. The patient                            tolerated the procedure well. Scope In: Scope Out: Findings:      The larynx was normal.      No endoscopic abnormality was evident in the esophagus to explain the       patient's complaint of dysphagia. Biopsies were taken with a cold       forceps for histology from the distal esophagus, about 5 cm above the GE       junction, and from the proximal esophagus, about 5 cm below the UES,       where there was some non-adherent white exudate felt to probably       represent mucus, not Candida.      There is no endoscopic evidence of inflammation, stenosis, stricture,       abnormal motility, findings consistent with achalasia or lower       esophageal sphincter tone abnormalities in the entire esophagus.      A 1 cm hiatal hernia was present.      Bilious fluid was found on the greater curvature of the stomach and in       the gastric antrum.      Diffuse moderately erythematous mucosa was found in the gastric body and       in the gastric antrum, suggestive of bile reflux gastritis. Biopsies  were taken with a cold forceps for histology.      The cardia and gastric fundus were normal on retroflexion.      The examined duodenum was normal. Impression:               - Normal larynx.                           - No endoscopic esophageal abnormality to explain                            patient's dysphagia. Proximal and distal esophagus                            biopsied.                           - 1 cm hiatal hernia.                           -  Bilious gastric fluid.                           - Erythematous mucosa in the gastric body and                            antrum suggestive of bile reflux. Biopsied.                           - Normal examined duodenum. Recommendation:           - Await pathology results.                           - Continue present medications.                           - Perform a barium swallow using barium in liquid                            and tablet form at appointment to be scheduled.                           - Perform a modified barium swallow as previously                            scheduled. Procedure Code(s):        --- Professional ---                           [phone], Esophagogastroduodenoscopy, flexible,                            transoral; with biopsy, single or multiple Diagnosis Code(s):        --- Professional ---                           R13.10, Dysphagia, unspecified  K31.89, Other diseases of stomach and duodenum                           R63.4, Abnormal weight loss CPT copyright 2019 American Medical Association. All rights reserved. The codes documented in this report are preliminary and upon coder review may  be revised to meet current compliance requirements. Bernette Redbird, MD 10/22/2019 3:40:19 PM This report has been signed electronically. Number of Addenda: 0

## 2019-10-22 NOTE — Anesthesia Postprocedure Evaluation (Signed)
Anesthesia Post Note  Patient: Kelly [name]  Procedure(s) Performed: ESOPHAGOGASTRODUODENOSCOPY (EGD) WITH PROPOFOL (N/A )     Patient location during evaluation: Endoscopy Anesthesia Type: MAC Level of consciousness: awake and alert Pain management: pain level controlled Vital Signs Assessment: post-procedure vital signs reviewed and stable Respiratory status: spontaneous breathing, nonlabored ventilation and respiratory function stable Cardiovascular status: stable and blood pressure returned to baseline Postop Assessment: no apparent nausea or vomiting Anesthetic complications: no   No complications documented.  Last Vitals:  Vitals:   10/22/19 1550 10/22/19 1600  BP: (!) 123/54 (!) 120/32  Pulse: 68 63  Resp: 16 16  Temp:    SpO2: 100% 100%    Last Pain:  Vitals:   10/22/19 1600  TempSrc:   PainSc: 0-No pain                 Marveline Profeta,W. EDMOND

## 2019-10-22 NOTE — Evaluation (Signed)
Occupational Therapy Evaluation Patient Details Name: Kelly [name] MRN: [id number] DOB: 03-28-1955 Today's Date: 10/22/2019    History of Present Illness  64 yo female presenting to ED with weakness, nausea, and poor oral intake. CT chest concerning for interstitial lung disease and emphysema, CT abdomen suggestive of esophagitis and infectious/inflammatory colitis. PMH including COPD, tobacco abuse, chronic diastolic CHF, CAD/PCI and stents, HTN, and dyslipidemia.   Clinical Impression   PTA, pt was living with her roommate (her best friend since childhood) and was independent with ADLs and light IADLs. Pt reports that she was furniture walking normally and was "unable to walk" PTA. Pt currently requiring with decreased balance, strength, and activity tolerance impacting her safe performance of ADLs. Pt requiring Min A for LB ADLs and functional transfers with RW. Pt reporting she would liek to return home and that her roommate is very supportive and helpful. Pt would benefit from further acute OT to facilitate safe dc. Recommend dc to home with HHOT for further OT to optimize safety, independence with ADLs, and return to PLOF.     Follow Up Recommendations  Home health OT;Supervision/Assistance - 24 hour    Equipment Recommendations  3 in 1 bedside commode;Other (comment) (RW)    Recommendations for Other Services PT consult     Precautions / Restrictions Precautions Precautions: Fall      Mobility Bed Mobility Overal bed mobility: Needs Assistance Bed Mobility: Supine to Sit     Supine to sit: Supervision     General bed mobility comments: Supervision for safety. No phsyical A needed  Transfers Overall transfer level: Needs assistance Equipment used: Rolling walker (2 wheeled) Transfers: Sit to/from Stand Sit to Stand: Min assist         General transfer comment: Min A for gaina balance in standing    Balance Overall balance assessment: Needs  assistance Sitting-balance support: No upper extremity supported;Feet supported Sitting balance-Leahy Scale: Good     Standing balance support: During functional activity;Bilateral upper extremity supported Standing balance-Leahy Scale: Poor Standing balance comment: Reliant on UE support                           ADL either performed or assessed with clinical judgement   ADL Overall ADL's : Needs assistance/impaired Eating/Feeding: Set up;Sitting   Grooming: Set up;Oral care;Sitting   Upper Body Bathing: Set up;Supervision/ safety;Sitting   Lower Body Bathing: Minimal assistance;Sit to/from stand   Upper Body Dressing : Supervision/safety;Set up;Sitting   Lower Body Dressing: Minimal assistance;Sit to/from stand Lower Body Dressing Details (indicate cue type and reason): Pt able to adjust socks while sitting at EOB. Min A for standing balance Toilet Transfer: Minimal assistance           Functional mobility during ADLs: Minimal assistance;Rolling walker (3-4 steps to stand and turn to recliner) General ADL Comments: Pt presenting with decreased strength and activity tolerance     Vision Baseline Vision/History: Macular Degeneration Patient Visual Report: No change from baseline       Perception     Praxis      Pertinent Vitals/Pain Pain Assessment: 0-10 Pain Score: 5  Pain Intervention(s): Monitored during session;Limited activity within patient's tolerance;Repositioned     Hand Dominance Right   Extremity/Trunk Assessment Upper Extremity Assessment Upper Extremity Assessment: Generalized weakness   Lower Extremity Assessment Lower Extremity Assessment: Defer to PT evaluation   Cervical / Trunk Assessment Cervical / Trunk Assessment: Kyphotic   Communication Communication Communication:  No difficulties   Cognition Arousal/Alertness: Awake/alert Behavior During Therapy: WFL for tasks assessed/performed Overall Cognitive Status: Within  Functional Limits for tasks assessed                                     General Comments  IV bleeding upon arrival. Stopped bleeding and notified RN and NT    Exercises     Shoulder Instructions      Home Living Family/patient expects to be discharged to:: Private residence Living Arrangements: Non-relatives/Friends Available Help at Discharge: Friend(s);Available 24 hours/day Type of Home: House Home Access: Stairs to enter [employer] of Steps: 6 Entrance Stairs-Rails: Can reach both Home Layout: Two level;Bed/bath upstairs Alternate Level Stairs-Number of Steps: Flight Alternate Level Stairs-Rails: Left Bathroom Shower/Tub: Chief Strategy Officer: Standard     Home Equipment: Shower seat          Prior Functioning/Environment Level of Independence: Independent        Comments: Furnature walk        OT Problem List: Decreased strength;Decreased range of motion;Impaired balance (sitting and/or standing);Decreased activity tolerance;Decreased knowledge of use of DME or AE;Decreased knowledge of precautions      OT Treatment/Interventions: Self-care/ADL training;Therapeutic exercise;Energy conservation;DME and/or AE instruction;Therapeutic activities;Patient/family education    OT Goals(Current goals can be found in the care plan section) Acute Rehab OT Goals Patient Stated Goal: Go home OT Goal Formulation: With patient Time For Goal Achievement: 11/05/19 Potential to Achieve Goals: Good  OT Frequency: Min 2X/week   Barriers to D/C:            Co-evaluation              AM-PAC OT "6 Clicks" Daily Activity     Outcome Measure Help from another person eating meals?: A Little Help from another person taking care of personal grooming?: A Little Help from another person toileting, which includes using toliet, bedpan, or urinal?: A Little Help from another person bathing (including washing, rinsing, drying)?: A  Little Help from another person to put on and taking off regular upper body clothing?: A Little Help from another person to put on and taking off regular lower body clothing?: A Little 6 Click Score: 18   End of Session Equipment Utilized During Treatment: Rolling walker Nurse Communication: Mobility status  Activity Tolerance: Patient tolerated treatment well Patient left: in chair;with call bell/phone within reach;with chair alarm set  OT Visit Diagnosis: Unsteadiness on feet (R26.81);Other abnormalities of gait and mobility (R26.89);Muscle weakness (generalized) (M62.81);History of falling (Z91.81)                Time: 3235-5732 OT Time Calculation (min): 25 min Charges:  OT General Charges $OT Visit: 1 Visit OT Evaluation $OT Eval Moderate Complexity: 1 Mod OT Treatments $Self Care/Home Management : 8-22 mins  Brittain Hosie MSOT, OTR/L Acute Rehab Pager: [phone] Office: [phone]  Theodoro Grist Marializ Ferrebee 10/22/2019, 12:44 PM

## 2019-10-22 NOTE — Progress Notes (Signed)
We have been asked to see this patient because of dysphagia, odynophagia, and weight loss.  I hope to see the patient later on today and possibly even get endoscopy accomplished this afternoon.  Florencia Reasons, M.D. Pager [phone] If no answer or after 5 PM call [phone]

## 2019-10-22 NOTE — Transfer of Care (Signed)
Immediate Anesthesia Transfer of Care Note  Patient: Kelly [name]  Procedure(s) Performed: ESOPHAGOGASTRODUODENOSCOPY (EGD) WITH PROPOFOL (N/A )  Patient Location: PACU  Anesthesia Type:MAC  Level of Consciousness: awake and drowsy  Airway & Oxygen Therapy: Patient Spontanous Breathing and Patient connected to nasal cannula oxygen  Post-op Assessment: Report given to RN and Post -op Vital signs reviewed and stable  Post vital signs: Reviewed and stable  Last Vitals:  Vitals Value Taken Time  BP 111/48 10/22/19 1540  Temp 36.6 C 10/22/19 1540  Pulse 61 10/22/19 1541  Resp 13 10/22/19 1541  SpO2 100 % 10/22/19 1541  Vitals shown include unvalidated device data.  Last Pain:  Vitals:   10/22/19 1540  TempSrc: Axillary  PainSc: 0-No pain         Complications: No complications documented.

## 2019-10-22 NOTE — Progress Notes (Signed)
Patient's endoscopy, surprisingly, did not show any cause for her dysphagia.  Specifically, there was no evidence of an esophageal stricture, despite the fact she has had numerous previous esophageal dilatations in [place], [place], by her report.  There was no significant esophagitis; there was a little bit of white exudate proximally but it was nonadherent and I doubt it reflects Candida.  There was no "stacked ring" appearance to suggest eosinophilic esophagitis but I did take proximal and distal biopsies to check for that.  She does appear to have some bile reflux gastritis, which is an incidental finding and does not mandate treatment in the absence of nausea or epigastric burning.  I will await speech therapy assessment with consideration of a modified barium swallow to see if the patient's symptoms are actually more of oropharyngeal, rather than esophageal, origin.  I have also ordered, for Monday, a standard barium swallow with tablet to assess esophageal motility.  We will recheck the patient on Monday, but call us in the meantime if questions arise or if we can be of further help.  Florencia Reasons, M.D. Pager [phone] If no answer or after 5 PM call [phone]

## 2019-10-23 ENCOUNTER — Encounter (HOSPITAL_COMMUNITY): Payer: Self-pay | Admitting: Gastroenterology

## 2019-10-23 LAB — COMPREHENSIVE METABOLIC PANEL
ALT: 6 U/L (ref 0–44)
AST: 8 U/L — ABNORMAL LOW (ref 15–41)
Albumin: 2.2 g/dL — ABNORMAL LOW (ref 3.5–5.0)
Alkaline Phosphatase: 56 U/L (ref 38–126)
Anion gap: 12 (ref 5–15)
BUN: 10 mg/dL (ref 8–23)
CO2: 17 mmol/L — ABNORMAL LOW (ref 22–32)
Calcium: 7.5 mg/dL — ABNORMAL LOW (ref 8.9–10.3)
Chloride: 107 mmol/L (ref 98–111)
Creatinine, Ser: 1.07 mg/dL — ABNORMAL HIGH (ref 0.44–1.00)
GFR calc Af Amer: >60 mL/min (ref >60)
GFR calc non Af Amer: 54 mL/min — ABNORMAL LOW (ref >60)
Glucose, Bld: 75 mg/dL (ref 70–99)
Potassium: 3.2 mmol/L — ABNORMAL LOW (ref 3.5–5.1)
Sodium: 136 mmol/L (ref 135–145)
Total Bilirubin: 0.5 mg/dL (ref 0.3–1.2)
Total Protein: 5.1 g/dL — ABNORMAL LOW (ref 6.5–8.1)

## 2019-10-23 LAB — CBC
HCT: 33.9 % — ABNORMAL LOW (ref 36.0–46.0)
Hemoglobin: 11.3 g/dL — ABNORMAL LOW (ref 12.0–15.0)
MCH: 33.6 pg (ref 26.0–34.0)
MCHC: 33.3 g/dL (ref 30.0–36.0)
MCV: 100.9 fL — ABNORMAL HIGH (ref 80.0–100.0)
Platelets: 129 10*3/uL — ABNORMAL LOW (ref 150–400)
RBC: 3.36 MIL/uL — ABNORMAL LOW (ref 3.87–5.11)
RDW: 13.4 % (ref 11.5–15.5)
WBC: 10.5 10*3/uL (ref 4.0–10.5)
nRBC: 0 % (ref 0.0–0.2)

## 2019-10-23 LAB — GASTROINTESTINAL PANEL BY PCR, STOOL (REPLACES STOOL CULTURE)

## 2019-10-23 LAB — MAGNESIUM: Magnesium: 2 mg/dL (ref 1.7–2.4)

## 2019-10-23 LAB — TSH: TSH: 0.616 u[IU]/mL (ref 0.350–4.500)

## 2019-10-23 MED ORDER — PANTOPRAZOLE SODIUM 40 MG PO TBEC
40.0000 mg | DELAYED_RELEASE_TABLET | Freq: Every day | ORAL | Status: DC
  Administered 2019-10-23 – 2019-11-05 (×14): 40 mg via ORAL
  Filled 2019-10-23 (×15): qty 1

## 2019-10-23 MED ORDER — POTASSIUM CHLORIDE CRYS ER 20 MEQ PO TBCR
40.0000 meq | EXTENDED_RELEASE_TABLET | Freq: Once | ORAL | Status: AC
  Administered 2019-10-23: 40 meq via ORAL
  Filled 2019-10-23: qty 2

## 2019-10-23 MED ORDER — COLLAGENASE 250 UNIT/GM EX OINT
TOPICAL_OINTMENT | Freq: Every day | CUTANEOUS | Status: DC
  Filled 2019-10-23: qty 30

## 2019-10-23 MED ORDER — LOPERAMIDE HCL 2 MG PO CAPS
2.0000 mg | ORAL_CAPSULE | ORAL | Status: DC | PRN
  Administered 2019-10-24 – 2019-11-03 (×13): 2 mg via ORAL
  Filled 2019-10-23 (×13): qty 1

## 2019-10-23 NOTE — Progress Notes (Signed)
Patient's albumin has dropped to 2.2.  Speech therapy note reviewed, agree with plan, including standard barium swallow tomorrow.  Esophageal and gastric biopsies pending.  Meanwhile, there is the question of "colitis" on her CT scan.  The patient states that she does have a tendency for diarrhea at home, readily controlled by the use of loperamide.  Recommendations (discussed with Dr. Jomarie Longs):  1.  Await barium swallow results  2.  Discontinue antibiotics for "colitis" and observe patient's bowel function while awaiting GI pathogen panel results, which are currently pending.  Taking the metronidazole out of the picture might help with some of her nausea.  3.  I have ordered prn Imodium, 2 mg after each loose bowel movement  Kelly [name], M.D. Pager [phone] If no answer or after 5 PM call [phone]

## 2019-10-23 NOTE — TOC Initial Note (Signed)
Transition of Care [facility]) - Initial/Assessment Note    Patient Details  Name: Kelly [name] MRN: [id number] Date of Birth: Jun 01, 1955  [facility] CM/SW Contact:    Varney Baas Phone Number:  10/23/2019, 10:16 AM  Clinical Narrative:                 CSW met with patient to discuss discharge plan. Patient expressed understanding of PT recommendation for SNF or supervision at discharge. Patient stated she lives with her roommate Arbie Cookey [phone] and other roommate who is Carol's grandson. Patient stated she would have supervision at home upon discharge. Permission provided to contact Arbie Cookey, patient stated she does not her care discussed with her son yet. Patient lives in a 2-story home and stated she has a wheelchair.    Expected Discharge Plan: [place] Barriers to Discharge: Continued Medical Work up   Patient Goals and CMS Choice   CMS Medicare.gov Compare Post Acute Care list provided to:: Patient Choice offered to / list presented to : Patient  Expected Discharge Plan and Services Expected Discharge Plan: [place] In-house Referral: Clinical Social Work   Post Acute Care Choice: [place] arrangements for the past 2 months: [place]                                      Prior Living Arrangements/Services Living arrangements for the past 2 months: Single Family Home Lives with:: Roommate Patient language and need for interpreter reviewed:: Yes Do you feel safe going back to the place where you live?: Yes      Need for Family Participation in Patient Care: Yes (Comment) Care giver support system in place?: Yes (comment)   Criminal Activity/Legal Involvement Pertinent to Current Situation/Hospitalization: No - Comment as needed  Activities of Daily Living Home Assistive Devices/Equipment: None ADL Screening (condition at time of admission) Patient's cognitive ability adequate to safely  complete daily activities?: Yes Is the patient deaf or have difficulty hearing?: No Does the patient have difficulty seeing, even when wearing glasses/contacts?: No Does the patient have difficulty concentrating, remembering, or making decisions?: No Patient able to express need for assistance with ADLs?: Yes Does the patient have difficulty dressing or bathing?: No Independently performs ADLs?: Yes (appropriate for developmental age) Does the patient have difficulty walking or climbing stairs?: Yes Weakness of Legs: Both Weakness of Arms/Hands: Both  Permission Sought/Granted                  Emotional Assessment   Attitude/Demeanor/Rapport: Engaged Affect (typically observed): Appropriate Orientation: : Oriented to Self, Oriented to Place, Oriented to  Time, Oriented to Situation Alcohol / Substance Use: Not Applicable Psych Involvement: No (comment)  Admission diagnosis:  Trauma [T14.90XA] Dysphagia [R13.10] Patient Active Problem List   Diagnosis Date Noted   Dysphagia 10/21/2019   Esophagitis 10/21/2019   Unintentional weight loss 10/21/2019   Colitis 10/21/2019   Hypokalemia 10/21/2019   Abnormality of gait 12/28/2018   History of falling 12/28/2018   Wheelchair dependent 12/28/2018   Scoliosis (and kyphoscoliosis), idiopathic 12/28/2018   HTN (hypertension) 12/08/2018   COPD (chronic obstructive pulmonary disease) ([place]) 12/08/2018   Migraine with aura and without status migrainosus, not intractable 11/30/2018   Coronary artery disease involving native coronary artery of native heart without angina pectoris 11/30/2018   Functional fecal incontinence 11/30/2018  Functional urinary incontinence 11/30/2018   Fibromyalgia 11/30/2018   Chest pain 05/02/2018   PCP:  Nicolette Bang, DO Pharmacy:   [facility], [address] [place] [address] Phone: [phone] Fax:  [phone]     Social Determinants of Health (SDOH) Interventions    Readmission Risk Interventions No flowsheet data found.

## 2019-10-23 NOTE — Consult Note (Signed)
WOC Nurse Consult Note: Reason for Consult:Small Unstageable pressure injury to left hip. Wound type:Pressure Pressure Injury POA: Yes Measurement: 0.8cm x 0.6cm with depth unable to be determined due to the presence of nonviable tissue (yellow slough) Wound bed:As noted above, 100% yellow slough Drainage (amount, consistency, odor) small amount light yellow exudate Periwound:mild erythema Dressing procedure/placement/frequency: I have implemented a POC using collagenase (Santyl), an enzymatic debriding agent to remove the yellow slough. Patient positioning is to be from right side to back and avoid the left side.Patient to follow up with her PCP.  WOC nursing team will not follow, but will remain available to this patient, the nursing and medical teams.  Please re-consult if needed. Thanks, Ladona Mow, MSN, RN, GNP, [place]  Pager# [phone]

## 2019-10-23 NOTE — Progress Notes (Signed)
PROGRESS NOTE    Kelly [name]  JAS:[id number] DOB: 28-Nov-1955 DOA: 10/21/2019 PCP: Arvilla Market, DO  Brief Narrative: Chronically ill extremely cachectic 64 year old female with history of COPD, tobacco abuse, chronic diastolic CHF, CAD/PCI and stents, hypertension, dyslipidemia presented to the emergency room 8/20 with worsening weakness, nausea, dysphagia and odynophagia, poor oral intake for over 2 months, this has been slowly progressive.  She has lost about 25 pounds, denies any fevers or chills, continues to smoke a pack of cigarettes a day. -Work-up in the ED noted mild hypotension but improved with saline, severe hypokalemia CT chest concerning for interstitial lung disease and emphysema, CT abdomen suggestive of esophagitis and infectious/inflammatory colitis  Assessment & Plan:   Severe dysphagia/odynophagia Severe protein calorie malnutrition/weight loss Adult failure to thrive -Unable to eat and drink, swallowing progressively worsening for months, no overt findings concerning for malignancy based on CT chest/abdomen pelvis [facility] gastroenterology consulting, endoscopy 8/21 was unremarkable, follow-up biopsy -Change to oral PPI, follow-up barium esophagram -SLP following -Dietitian consulted, calorie counting  Possible colitis -Patient reports chronic diarrhea, could be more malabsorption -Started on ceftriaxone and Flagyl on admission -C. 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 Heavy tobacco abuse -Concern for interstitial lung disease based on CT, no wheezing or oxygen requirement at this time -Counseled, continue nebs as needed -Recommended need for pulmonary follow-up  CAD History of PCI/stents -Stable  Chronic diastolic CHF -She is not volume overloaded at this time  DVT prophylaxis: Lovenox Code Status: Full code Family  Communication: Discussed with patient in detail, no family at bedside, does not want me to contact her son who has epilepsy Disposition Plan:  Status is: inpatient  Patient remains inpatient due to: Severity of illness  Dispo: The patient is from: Home              Anticipated d/c is to: SNF              Anticipated d/c date is: > 2days              Patient currently is not medically stable to d/c.  Consultants:   [facility]   Procedures:   Antimicrobials:    Subjective: -History of ongoing odynophagia and dysphagia for weeks to months, severe weight loss and failure to thrive  Objective: Vitals:   10/22/19 1627 10/22/19 2026 10/23/19 0410 10/23/19 0932  BP: (!) 139/51 (!) 143/66 (!) 153/52 (!) 160/51  Pulse: (!) 58 71 62 63  Resp: 16 18 18 16   Temp:  98 F (36.7 C) 97.9 F (36.6 C) 99.1 F (37.3 C)  TempSrc:  Oral Oral Oral  SpO2: 99% 96% 100% 97%  Height:        Intake/Output Summary (Last 24 hours) at 10/23/2019 1333 Last data filed at 10/23/2019 1221 Gross per 24 hour  Intake 1741.95 ml  Output 150 ml  Net 1591.95 ml   There were no vitals filed for this visit.  Examination:  General exam: Chronically ill extremely cachectic female appears much older than stated age, awake alert oriented to self, place and partly to time, mild cognitive deficits noted HEENT: No JVD CVS: S1-S2, regular rate rhythm Abdomen: Soft, nontender, bowel sounds present Extremities: No edema Neuro: Moves all extremities, no localizing findings  Data Reviewed:   CBC: Recent Labs  Lab 10/21/19 1355 10/23/19 0815  WBC 17.8* 10.5  HGB 14.1 11.3*  HCT 41.8 33.9*  MCV 101.7* 100.9*  PLT 171 129*   Basic Metabolic Panel: Recent Labs  Lab 10/21/19 1355 10/21/19 1730 10/22/19 0041 10/23/19 0815  NA 136  --  136 136  K 2.6*  --  3.7 3.2*  CL 102  --  107 107  CO2 22  --  25 17*  GLUCOSE 115*  --  77 75  BUN 12  --  11 10  CREATININE 1.14*  --  1.13* 1.07*   CALCIUM 8.1*  --  7.4* 7.5*  MG  --  1.6* 1.6* 2.0   GFR: CrCl cannot be calculated (Unknown ideal weight.). Liver Function Tests: Recent Labs  Lab 10/21/19 1355 10/23/19 0815  AST 10* 8*  ALT 5 6  ALKPHOS 69 56  BILITOT 0.9 0.5  PROT 6.1* 5.1*  ALBUMIN 2.5* 2.2*   Recent Labs  Lab 10/21/19 1355  LIPASE 25   No results for input(s): AMMONIA in the last 168 hours. Coagulation Profile: No results for input(s): INR, PROTIME in the last 168 hours. Cardiac Enzymes: No results for input(s): CKTOTAL, CKMB, CKMBINDEX, TROPONINI in the last 168 hours. BNP (last 3 results) No results for input(s): PROBNP in the last 8760 hours. HbA1C: No results for input(s): HGBA1C in the last 72 hours. CBG: No results for input(s): GLUCAP in the last 168 hours. Lipid Profile: No results for input(s): CHOL, HDL, LDLCALC, TRIG, CHOLHDL, LDLDIRECT in the last 72 hours. Thyroid Function Tests: No results for input(s): TSH, T4TOTAL, FREET4, T3FREE, THYROIDAB in the last 72 hours. Anemia Panel: No results for input(s): VITAMINB12, FOLATE, FERRITIN, TIBC, IRON, RETICCTPCT in the last 72 hours. Urine analysis: No results found for: COLORURINE, APPEARANCEUR, LABSPEC, PHURINE, GLUCOSEU, HGBUR, BILIRUBINUR, KETONESUR, PROTEINUR, UROBILINOGEN, NITRITE, LEUKOCYTESUR Sepsis Labs: (procalcitonin:4,lacticidven:4)  ) Recent Results (from the past 240 hour(s))  SARS Coronavirus 2 by RT PCR (hospital order, performed in [facility] hospital lab) Nasopharyngeal Nasopharyngeal Swab     Status: None   Collection Time: 10/21/19  5:01 PM   Specimen: Nasopharyngeal Swab  Result Value Ref Range Status   SARS Coronavirus 2 NEGATIVE NEGATIVE Final    Comment: (NOTE) SARS-CoV-2 target nucleic acids are NOT DETECTED.  The SARS-CoV-2 RNA is generally detectable in upper and lower respiratory specimens during the acute phase of infection. The lowest concentration of SARS-CoV-2 viral copies this assay can  detect is 250 copies / mL. A negative result does not preclude SARS-CoV-2 infection and should not be used as the sole basis for treatment or other patient management decisions.  A negative result may occur with improper specimen collection / handling, submission of specimen other than nasopharyngeal swab, presence of viral mutation(s) within the areas targeted by this assay, and inadequate number of viral copies (<250 copies / mL). A negative result must be combined with clinical observations, patient history, and epidemiological information.  Fact Sheet for Patients:   BoilerBrush.com.cy  Fact Sheet for Healthcare Providers: https://pope.com/  This test is not yet approved or  cleared by the [place] FDA and has been authorized for detection and/or diagnosis of SARS-CoV-2 by FDA under an Emergency Use Authorization (EUA).  This EUA will remain in effect (meaning this test can be used) for the duration of the COVID-19 declaration under Section 564(b)(1) of the Act, 21 U.S.C. section 360bbb-3(b)(1), unless the authorization is terminated or revoked sooner.  Performed at [facility], [address] [place], [place] [address]   Culture, blood (routine x 2)     Status: None (  Preliminary result)   Collection Time: 10/22/19 12:41 AM   Specimen: BLOOD  Result Value Ref Range Status   Specimen Description BLOOD LEFT ANTECUBITAL  Final   Special Requests   Final    BOTTLES DRAWN AEROBIC AND ANAEROBIC Blood Culture adequate volume   Culture   Final    NO GROWTH 1 DAY Performed at [facility], [address] [place], [place] [address]    Report Status PENDING  Incomplete  Culture, blood (routine x 2)     Status: None (Preliminary result)   Collection Time: 10/22/19 12:47 AM   Specimen: BLOOD RIGHT HAND  Result Value Ref Range Status   Specimen Description BLOOD RIGHT HAND  Final   Special Requests   Final     BOTTLES DRAWN AEROBIC ONLY Blood Culture adequate volume   Culture   Final    NO GROWTH 1 DAY Performed at [facility], [address] [place], [place] [address]    Report Status PENDING  Incomplete  C Difficile Quick Screen w PCR reflex     Status: None   Collection Time: 10/22/19  4:45 PM   Specimen: STOOL  Result Value Ref Range Status   C Diff antigen NEGATIVE NEGATIVE Final   C Diff toxin NEGATIVE NEGATIVE Final   C Diff interpretation No C. difficile detected.  Final    Comment: Performed at [facility], [address] [place], [place] [address]         Radiology Studies: DG Chest 2 View  Result Date: 10/21/2019 CLINICAL DATA:  Chest pain for 2-3 months, congestion, weakness, history coronary artery disease, CHF, hypertension EXAM: CHEST - 2 VIEW COMPARISON:  05/02/2018 FINDINGS: Normal heart size, mediastinal contours, and pulmonary vascularity. Multiple coronary stents. Atherosclerotic calcification aorta. Lungs mildly hyperinflated but clear. No acute infiltrate, pleural effusion or pneumothorax. Bones demineralized with scoliosis thoracic spine. IMPRESSION: Hyperinflated lungs without infiltrate. Electronically Signed   By: Ulyses Southward M.D.   On: 10/21/2019 14:17   CT CHEST ABDOMEN PELVIS W CONTRAST  Result Date: 10/21/2019 CLINICAL DATA:  Generalized weakness, weight loss EXAM: CT CHEST, ABDOMEN, AND PELVIS WITH CONTRAST TECHNIQUE: Multidetector CT imaging of the chest, abdomen and pelvis was performed following the standard protocol during bolus administration of intravenous contrast. CONTRAST:  OMNIPAQUE IOHEXOL 300 MG/ML  SOLN COMPARISON:  CT 10/22/2018, radiograph 10/21/2019 FINDINGS: CT CHEST FINDINGS Cardiovascular: The aortic root is suboptimally assessed given cardiac pulsation artifact. Atherosclerotic plaque within the normal caliber aorta. No acute luminal abnormality. No periaortic stranding or hemorrhage. Shared origin of the brachiocephalic  and left common carotid artery. Proximal great vessels demonstrate atheromatous plaque including calcification at the left vertebral artery ostium incompletely assessed on this non angiographic technique. Central pulmonary arteries are borderline enlarged. No large central or lobar filling defects on this non tailored examination. Cardiac size within normal limits. Three-vessel coronary artery disease is noted. No pericardial effusion. No major venous anomalies. Mediastinum/Nodes: No mediastinal fluid or gas. Postsurgical changes likely reflecting prior left hemithyroidectomy with a normal appearance of right lobe thyroid gland. No acute abnormality of the trachea. Mild circumferential thickening and mucosal hyperemia of the mid to distal thoracic esophagus with small amount of fluid the level of the mid esophagus. No worrisome mediastinal, hilar or axillary adenopathy. Lungs/Pleura: Nonspecific ill-defined centrilobular micro nodularity distributed throughout the bilateral upper lobes/apices as well as further areas of ground-glass opacity with a mid to upper lung predilection. Some mild diffuse airways thickening  and scattered secretions are noted as well as a background of mild centrilobular emphysematous change. No focal consolidative opacity is evident. No pneumothorax. No other concerning nodularity or pulmonary masses. Musculoskeletal: The osseous structures appear diffusely demineralized which may limit detection of small or nondisplaced fractures. No acute osseous abnormality or suspicious osseous lesion. Mild chest wall deformity associated with the severe dextrocurvature of the thoracolumbar spine. Multilevel degenerative changes in the spine. Milder degenerative changes in the bilateral shoulders. No acute or worrisome osseous lesions are evident. CT ABDOMEN PELVIS FINDINGS Hepatobiliary: Mildly heterogenous hepatic parenchymal enhancement. Small normal hepatic attenuation and smooth liver surface  contour seen on the delayed phase imaging. No visible focal liver lesions. Mild intra and extrahepatic biliary ductal dilatation is possibly related to reservoir effect given the postsurgical changes from prior cholecystectomy and similarity to comparison exam without visible intraductal gallstones. Pancreas: Unremarkable. No pancreatic ductal dilatation or surrounding inflammatory changes. Spleen: Normal in size. No concerning splenic lesions. Adrenals/Urinary Tract: Lobular thickening of the bilateral adrenal glands. No concerning dominant adrenal nodules. Mild bilateral cortical thinning both kidneys which otherwise enhance and excrete symmetrically. Nonspecific mild bilateral perinephric stranding is also unchanged from prior and is a nonspecific finding. No concerning renal masses. No urolithiasis or hydronephrosis. Urinary bladder is unremarkable. Stomach/Bowel: Thickened distal thoracic esophagus. Postsurgical changes at the GE junction may reflect prior fundoplication/wrap procedure. Duodenum with a normal course across the midline abdomen. No small bowel thickening or dilatation is evident. A normal appendix is visualized. Diffuse pancolonic edematous mural thickening and mucosal hyperemia with faint pericolonic hazy stranding. Lack of formed stool in the colon. Fluid-filled rectum noted as well. Serpentine hyperenhancement towards the rectum likely reflecting hemorrhoidal enhancement. Vascular/Lymphatic: Atherosclerotic calcifications within the abdominal aorta and branch vessels. No aneurysm or ectasia. Paired renal arteries bilaterally. Atheromatous ostial narrowing at the SMA, IMA and renal artery origins. No enlarged abdominopelvic lymph nodes. Reproductive: Mildly retroverted uterus. No concerning adnexal lesions. Previously seen cystic lesion in the right ovary no longer readily apparent with quiescent appearance of the ovarian tissue. Other: Circumferential body wall edema. Suspect some mild  superficial ulceration adjacent the bilateral greater trochanters and left ischial tuberosity without organized collection or abscess. No abdominopelvic free air or fluid. Mild body wall edema. No bowel containing hernias. Musculoskeletal: Diffuse muscular atrophy. Severe dextrocurvature of the thoracolumbar spine with apex at the L2-3 level and evidence of prior L3-L5 posterior spinal fusion and interbody cage placement. No evidence of hardware complication. Prior right iliac group bone harvest site. The osseous structures appear diffusely demineralized which may limit detection of small or nondisplaced fractures. No acute or suspicious osseous lesions are evident at this time. Multilevel degenerative changes in the spine. Additional degenerative changes in the hips and pelvis. IMPRESSION: 1. Nonspecific ill-defined centrilobular micro nodularity distributed throughout the bilateral upper lobes/apices as well as further areas of ground-glass opacity with a mid to upper lung predilection. Findings are suggestive of a potential interstitial process such as RB I LD particularly in this patient with current and extensive smoking history. 2. Background of mild centrilobular emphysema ( Emphysema (ICD10-J43.9).) 3. Mild circumferential thickening and mucosal hyperemia of the mid to distal thoracic esophagus with small amount of fluid the level of the mid esophagus, suggestive of esophagitis. Consider correlation with outpatient endoscopy. 4. Diffuse pancolonic edematous mural thickening and mucosal hyperemia with faint pericolonic hazy stranding. Lack of formed stool in the colon. Serpentine hyperenhancement towards the rectum likely reflecting hemorrhoidal enhancement. Findings are suggestive of infectious or  inflammatory colitis with rapid transit state. 5. Mildly heterogenous hepatic parenchymal enhancement, nonspecific but can be seen with hepatitis. Correlate with LFTs. 6. Biliary ductal prominence likely related to  post cholecystectomy reservoir effect given stability and lack of visible intraductal gallstones. 7. Severe dextrocurvature of the thoracolumbar spine with apex at the L2-3 level and evidence of prior L3-L5 posterior spinal fusion and interbody cage placement. No evidence of hardware complication.Mild associated chest wall deformity. 8. Suspect some mild superficial ulceration adjacent the bilateral greater trochanters and left ischial tuberosity without organized collection or abscess. Correlate with visual inspection. 9. Three-vessel coronary artery disease. 10. Aortic Atherosclerosis (ICD10-I70.0). Ostial narrowing at the IMA, SMA and renal artery origins. Electronically Signed   By: Kreg Shropshire M.D.   On: 10/21/2019 19:35        Scheduled Meds: . enoxaparin (LOVENOX) injection  40 mg Subcutaneous Q24H  . levothyroxine  12.5 mcg Intravenous Daily  . nicotine  21 mg Transdermal Daily  . pantoprazole (PROTONIX) IV  40 mg Intravenous Q24H   Continuous Infusions: . sodium chloride    . cefTRIAXone (ROCEPHIN)  IV 2 g (10/22/19 2208)  . metronidazole 500 mg (10/23/19 0403)     LOS: 1 day    Time spent:  Zannie Cove, MD [facility]  10/23/2019, 1:33 PM

## 2019-10-23 NOTE — Evaluation (Signed)
Clinical/Bedside Swallow Evaluation Patient Details  Name: Kelly [name] MRN: [id number] Date of Birth: 1955-05-17  Today's Date: 10/23/2019 Time: SLP Start Time (ACUTE ONLY): 0840 SLP Stop Time (ACUTE ONLY): 0857 SLP Time Calculation (min) (ACUTE ONLY): 17 min  Past Medical History:  Past Medical History:  Diagnosis Date   CAD (coronary artery disease)    5 stents in the past, 4 in [place], 1 in [place] [facility]. 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 female with medical history significant of CAD status post PCI, chronic diastolic CHF, hypertension, hyperlipidemia presenting with complaints of generalized weakness, nausea, dysphagia, and poor oral intake. Patient states she has had difficulty swallowing for at least 2 months now and she has been losing a lot of weight. Both food and liquid gets stuck when she tries to swallow and it is painful. States she has lost at least 20 to 25 pounds. She feels nauseous but is not vomiting. She is hardly able to eat anything and when she does, she experiences diarrhea. Denies abdominal pain. Reports having a chronic cough due to history of smoking. She smokes 1 pack of cigarettes daily. Denies fevers, shortness of breath, or chest pain. She has not been vaccinated against Covid.  CT of the abdomen and pelvis with findings suggestive of esophagitis and infection or inflammatory colitis. EGD has been requested from GI MD.    Assessment / Plan / Recommendation Clinical Impression  Patient presents with evidence of a primary esophageal dysphagia although cannot r/o pharyngeal component. Patient with reports of gagging, belching, and  regurgitation, all confirmed during bedside swallow evaluation today post intake of thin liquids via multiple consecutive sips. `With cueing to decreased bolus size and limit to single sip, patient was able to avoid regurgitation however continued to complain of globus across consistencies. Oral phase WFL and patient with swift appearing pharyngeal swallow but with consistent multiple swallows per bolus which could suggestive esophageal and/or pharyngeal component. Reviewed EGD results which were largely unremarkable for severity of symptoms. Agree with plan for barium swallow 8/23 to assess esophageal motility. If cause for symptoms is not located on barium swallow, will proceed with MBS to further assess oropharyngeal and cervical esophageal phases of swallow. Diet recommendations remains appropriate. Education provided to patient regarding general safe swallowing and esophageal precautions which may facilitate esophageal clearance and aid in comfort during po intake.  SLP Visit Diagnosis: Dysphagia, unspecified (R13.10)    Aspiration Risk  Mild aspiration risk    Diet Recommendation Dysphagia 1 (Puree);Thin liquid   Liquid Administration via: Cup;Straw Medication Administration: Crushed with puree Supervision: Patient able to self feed Compensations: Slow rate;Small sips/bites;Follow solids with liquid Postural Changes: Seated upright at 90 degrees;Remain upright for at least 30 minutes after po intake    Other  Recommendations Oral Care Recommendations: Oral care BID   Follow up Recommendations Other (comment) (TBD)      Frequency and Duration min 2x/week  1 week           Swallow Study   General HPI: Kelly [name] is a 64 y.o. female with medical history significant of CAD  status post PCI, chronic diastolic CHF, hypertension, hyperlipidemia presenting with complaints of generalized weakness, nausea, dysphagia, and poor oral intake. Patient states she has had difficulty swallowing for at  least 2 months now and she has been losing a lot of weight. Both food and liquid gets stuck when she tries to swallow and it is painful. States she has lost at least 20 to 25 pounds. She feels nauseous but is not vomiting. She is hardly able to eat anything and when she does, she experiences diarrhea. Denies abdominal pain. Reports having a chronic cough due to history of smoking. She smokes 1 pack of cigarettes daily. Denies fevers, shortness of breath, or chest pain. She has not been vaccinated against Covid.  CT of the abdomen and pelvis with findings suggestive of esophagitis and infection or inflammatory colitis. EGD has been requested from GI MD.  Type of Study: Bedside Swallow Evaluation Previous Swallow Assessment: none (see impression statement for EGD results) Diet Prior to this Study: Dysphagia 1 (puree);Thin liquids Temperature Spikes Noted: No Respiratory Status: Room air History of Recent Intubation: No Behavior/Cognition: Alert;Cooperative;Pleasant mood Oral Cavity Assessment: Within Functional Limits Oral Care Completed by SLP: No Oral Cavity - Dentition: Dentures, top Vision: Functional for self-feeding Self-Feeding Abilities: Able to feed self Patient Positioning: Upright in bed Baseline Vocal Quality: Normal Volitional Cough: Strong Volitional Swallow: Able to elicit    Oral/Motor/Sensory Function Overall Oral Motor/Sensory Function: Within functional limits   Ice Chips Ice chips: Not tested   Thin Liquid Thin Liquid: Impaired Presentation: Straw;Self Fed Pharyngeal  Phase Impairments: Multiple swallows;Cough - Delayed    Nectar Thick Nectar Thick Liquid: Not tested   Honey Thick Honey Thick Liquid: Not tested   Puree Puree: Impaired Presentation: Spoon Pharyngeal Phase Impairments: Multiple swallows   Solid     Solid: Impaired Presentation: Self Fed Pharyngeal Phase Impairments: Multiple swallows     Kelly Deman MA, CCC-SLP   Kelly [name] Meryl 10/23/2019,9:04  AM

## 2019-10-23 NOTE — Progress Notes (Deleted)
Cardiology Office Note:   Date:  10/23/2019  NAME:  Kelly [name]    MRN: [id number] DOB:  [birth year]   PCP:  Arvilla Market, DO  Cardiologist:  Reatha Harps, MD  Electrophysiologist:  None   Referring MD: Leary Roca*   No chief complaint on file. ***  History of Present Illness:   Kelly [name] is a 64 y.o. female with a hx of CAD, HTN, HLD, tobacco abuse who presents for follow-up. We put her on 5 mg crestor. Needs recheck Lipids.   Problem List 1. CAD -status post PCI x5 -PCI to mid diagonal in 2012 -PCI to mid LAD and obtuse marginal in March 2017 -PCI to OM1 x2 in 2018 -RCA CTO with L to R collaterals  -PCI to OM 1 was complicated by in-stent thrombosis and she was switched to Brilinta - MPI 12/30/2018 -> normal 2. HTN 3. COPD 4. Tobacco abuse  Past Medical History: Past Medical History:  Diagnosis Date   CAD (coronary artery disease)    5 stents in the past, 4 in [place], 1 in [place] [facility]. 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   Occurred while on Plavix, will need Brilinta moving forward   Hyperlipidemia LDL goal <70    Hypertension     Past Surgical History: Past Surgical History:  Procedure Laterality Date   BIOPSY  10/22/2019   Procedure: BIOPSY;  Surgeon: Bernette Redbird, MD;  Location: [facility];  Service: Endoscopy;;   CARDIAC CATHETERIZATION     stents x 5   ESOPHAGOGASTRODUODENOSCOPY (EGD) WITH PROPOFOL N/A 10/22/2019   Procedure: ESOPHAGOGASTRODUODENOSCOPY (EGD) WITH PROPOFOL;  Surgeon: Bernette Redbird, MD;  Location: [facility] ENDOSCOPY;  Service: Endoscopy;  Laterality: N/A;    Current Medications: No outpatient medications have been marked as taking for the 10/25/19 encounter (Appointment) with O'Neal, Ronnald Ramp, MD.     Allergies:    Morphine and related and Other   Social History: Social History   Socioeconomic History   Marital status:  Divorced    Spouse name: Not on file   Number of children: Not on file   Years of education: Not on file   Highest education level: Not on file  Occupational History   Not on file  Tobacco Use   Smoking status: Current Every Day Smoker    Packs/day: 1.00    Years: 50.00    Pack years: 50.00   Smokeless tobacco: Never Used  Substance and Sexual Activity   Alcohol use: Not Currently    Comment: used to be an Sales executive, clean for 30 years   Drug use: Yes    Types: Marijuana   Sexual activity: Not on file  Other Topics Concern   Not on file  Social History Narrative   Not on file   Social Determinants of Health   Financial Resource Strain:    Difficulty of Paying Living Expenses: Not on file  Food Insecurity:    Worried About Running Out of Food in the Last Year: Not on file   [employer] of Food in the Last Year: Not on file  Transportation Needs:    Lack of Transportation (Medical): Not on file   Lack of Transportation (Non-Medical): Not on file  Physical Activity:    Days of Exercise per Week: Not on file   Minutes of Exercise per Session: Not on file  Stress:    Feeling of Stress : Not  on file  Social Connections:    Frequency of Communication with Friends and Family: Not on file   Frequency of Social Gatherings with Friends and Family: Not on file   Attends Religious Services: Not on file   Active Member of Clubs or Organizations: Not on file   Attends Banker Meetings: Not on file   Marital Status: Not on file     Family History: The patient's ***family history includes Alzheimer's disease in her father and mother; Heart disease in her mother.  ROS:   All other ROS reviewed and negative. Pertinent positives noted in the HPI.     EKGs/Labs/Other Studies Reviewed:   The following studies were personally reviewed by me today:  EKG:  EKG is *** ordered today.  The ekg ordered today demonstrates ***, and was personally reviewed  by me.   Recent Labs: 10/23/2019: ALT 6; BUN 10; Creatinine, Ser 1.07; Hemoglobin 11.3; Magnesium 2.0; Platelets 129; Potassium 3.2; Sodium 136; TSH 0.616   Recent Lipid Panel    Component Value Date/Time   CHOL 154 05/03/2018 0202   TRIG 161 (H) 05/03/2018 0202   HDL 28 (L) 05/03/2018 0202   CHOLHDL 5.5 05/03/2018 0202   VLDL 32 05/03/2018 0202   LDLCALC 94 05/03/2018 0202    Physical Exam:   VS:  There were no vitals taken for this visit.   Wt Readings from Last 3 Encounters:  12/30/18 123 lb (55.8 kg)  12/28/18 118 lb 6.4 oz (53.7 kg)  12/08/18 123 lb 12.8 oz (56.2 kg)    General: Well nourished, well developed, in no acute distress Heart: Atraumatic, normal size  Eyes: PEERLA, EOMI  Neck: Supple, no JVD Endocrine: No thryomegaly Cardiac: Normal S1, S2; RRR; no murmurs, rubs, or gallops Lungs: Clear to auscultation bilaterally, no wheezing, rhonchi or rales  Abd: Soft, nontender, no hepatomegaly  Ext: No edema, pulses 2+ Musculoskeletal: No deformities, BUE and BLE strength normal and equal Skin: Warm and dry, no rashes   Neuro: Alert and oriented to person, place, time, and situation, CNII-XII grossly intact, no focal deficits  Psych: Normal mood and affect   ASSESSMENT:   Kelly [name] is a 64 y.o. female who presents for the following: No diagnosis found.  PLAN:   There are no diagnoses linked to this encounter.  Disposition: No follow-ups on file.  Medication Adjustments/Labs and Tests Ordered: Current medicines are reviewed at length with the patient today.  Concerns regarding medicines are outlined above.  No orders of the defined types were placed in this encounter.  No orders of the defined types were placed in this encounter.   There are no Patient Instructions on file for this visit.   Time Spent with Patient: I have spent a total of *** minutes with patient reviewing hospital notes, telemetry, EKGs, labs and examining the patient as well as  establishing an assessment and plan that was discussed with the patient.  > 50% of time was spent in direct patient care.  Signed, Lenna Gilford. Flora Lipps, MD [facility]  [address], [address] [place], [place] [address] [phone]  10/23/2019 6:54 PM

## 2019-10-24 ENCOUNTER — Inpatient Hospital Stay (HOSPITAL_COMMUNITY): Payer: Medicare HMO

## 2019-10-24 LAB — COMPREHENSIVE METABOLIC PANEL
ALT: 6 U/L (ref 0–44)
AST: 8 U/L — ABNORMAL LOW (ref 15–41)
Albumin: 2.2 g/dL — ABNORMAL LOW (ref 3.5–5.0)
Alkaline Phosphatase: 52 U/L (ref 38–126)
Anion gap: 12 (ref 5–15)
BUN: 8 mg/dL (ref 8–23)
CO2: 17 mmol/L — ABNORMAL LOW (ref 22–32)
Calcium: 7.8 mg/dL — ABNORMAL LOW (ref 8.9–10.3)
Chloride: 109 mmol/L (ref 98–111)
Creatinine, Ser: 1.09 mg/dL — ABNORMAL HIGH (ref 0.44–1.00)
GFR calc Af Amer: >60 mL/min (ref >60)
GFR calc non Af Amer: 53 mL/min — ABNORMAL LOW (ref >60)
Glucose, Bld: 57 mg/dL — ABNORMAL LOW (ref 70–99)
Potassium: 3.7 mmol/L (ref 3.5–5.1)
Sodium: 138 mmol/L (ref 135–145)
Total Bilirubin: 0.9 mg/dL (ref 0.3–1.2)
Total Protein: 5.1 g/dL — ABNORMAL LOW (ref 6.5–8.1)

## 2019-10-24 LAB — CBC
HCT: 33.6 % — ABNORMAL LOW (ref 36.0–46.0)
Hemoglobin: 11.4 g/dL — ABNORMAL LOW (ref 12.0–15.0)
MCH: 34.8 pg — ABNORMAL HIGH (ref 26.0–34.0)
MCHC: 33.9 g/dL (ref 30.0–36.0)
MCV: 102.4 fL — ABNORMAL HIGH (ref 80.0–100.0)
Platelets: 137 10*3/uL — ABNORMAL LOW (ref 150–400)
RBC: 3.28 MIL/uL — ABNORMAL LOW (ref 3.87–5.11)
RDW: 13.5 % (ref 11.5–15.5)
WBC: 10.1 10*3/uL (ref 4.0–10.5)
nRBC: 0 % (ref 0.0–0.2)

## 2019-10-24 LAB — CORTISOL-AM, BLOOD: Cortisol - AM: 22.1 ug/dL (ref 6.7–22.6)

## 2019-10-24 MED ORDER — BOOST / RESOURCE BREEZE PO LIQD CUSTOM
1.0000 | Freq: Three times a day (TID) | ORAL | Status: DC
  Administered 2019-10-24 – 2019-10-30 (×17): 1 via ORAL

## 2019-10-24 MED ORDER — ONDANSETRON HCL 4 MG/2ML IJ SOLN
4.0000 mg | Freq: Three times a day (TID) | INTRAMUSCULAR | Status: DC
  Administered 2019-10-24 – 2019-11-05 (×35): 4 mg via INTRAVENOUS
  Filled 2019-10-24 (×34): qty 2

## 2019-10-24 MED ORDER — DRONABINOL 2.5 MG PO CAPS
2.5000 mg | ORAL_CAPSULE | Freq: Two times a day (BID) | ORAL | Status: DC
  Administered 2019-10-24 – 2019-11-05 (×24): 2.5 mg via ORAL
  Filled 2019-10-24 (×24): qty 1

## 2019-10-24 NOTE — Progress Notes (Signed)
Occupational Therapy Treatment Patient Details Name: Jonny Longino MRN: [id number] DOB: [birth year] Today's Date: 10/24/2019    History of present illness 64 yo female presenting to ED with weakness, nausea, and poor oral intake. CT chest concerning for interstitial lung disease and emphysema, CT abdomen suggestive of esophagitis and infectious/inflammatory colitis. PMH including COPD, tobacco abuse, chronic diastolic CHF, CAD/PCI and stents, HTN, and dyslipidemia.   OT comments  Pt pleasant and willing to attempt tasks today, but OT session limited by nausea. Pt overall Min A for sit to stand at bedside and side steps up to [facility] with assistance needed to advance RW. Pt overall Supervision to adjust socks long sitting in bed, but will require physical assist to maintain standing balance for other LB ADLs. HR up to 135bpm during these activities. Pt reports desire to go home (would need BSC and RW to maximize safety), but reports frequent falls at home. DC recommendations updated according to pt's current functional abilities today.    Follow Up Recommendations  SNF;Supervision/Assistance - 24 hour;Other (comment) (HHOT if 24/7 physical assist available)    Equipment Recommendations  3 in 1 bedside commode;Other (comment) (RW)    Recommendations for Other Services      Precautions / Restrictions Precautions Precautions: Fall Restrictions Weight Bearing Restrictions: No       Mobility Bed Mobility Overal bed mobility: Needs Assistance Bed Mobility: Supine to Sit;Sit to Supine     Supine to sit: Supervision;HOB elevated Sit to supine: Supervision   General bed mobility comments: Supervision with increased time  Transfers Overall transfer level: Needs assistance Equipment used: Rolling walker (2 wheeled) Transfers: Sit to/from Stand Sit to Stand: Min assist         General transfer comment: Min A for power up from wheelchair with increased time. Min A for side steps up to [facility]  with RW    Balance Overall balance assessment: Needs assistance;History of Falls Sitting-balance support: No upper extremity supported;Feet supported Sitting balance-Leahy Scale: Fair     Standing balance support: During functional activity;Bilateral upper extremity supported Standing balance-Leahy Scale: Poor Standing balance comment: Reliant on UE support                           ADL either performed or assessed with clinical judgement   ADL Overall ADL's : Needs assistance/impaired Eating/Feeding: Set up;Sitting   Grooming: Set up;Wash/dry face;Sitting                 Lower Body Dressing Details (indicate cue type and reason): Supervision to adjust B socks long sitting in bed             Functional mobility during ADLs: Minimal assistance;Rolling walker General ADL Comments: Pt with nausea today and decreased strength, standing balance and endurance today     Vision   Vision Assessment?:  (macular degeneration, difficulty reading)   Perception     Praxis      Cognition Arousal/Alertness: Awake/alert Behavior During Therapy: WFL for tasks assessed/performed Overall Cognitive Status: Within Functional Limits for tasks assessed                                          Exercises     Shoulder Instructions       General Comments HR up to 135bpm with side steps up to [facility]. Increased nausea and weakness  today impacting ability to complete many OOB activities. Pt reports frequent falls at home    Pertinent Vitals/ Pain       Pain Assessment: No/denies pain  Home Living                                          Prior Functioning/Environment              Frequency  Min 2X/week        Progress Toward Goals  OT Goals(current goals can now be found in the care plan section)  Progress towards OT goals: Progressing toward goals  Acute Rehab OT Goals Patient Stated Goal: Go home OT Goal Formulation:  With patient Time For Goal Achievement: 11/05/19 Potential to Achieve Goals: Good ADL Goals Pt Will Perform Grooming: sitting;standing;with modified independence Pt Will Perform Upper Body Dressing: with modified independence;sitting Pt Will Perform Lower Body Dressing: with modified independence;sit to/from stand Pt Will Transfer to Toilet: with modified independence;ambulating;bedside commode Pt Will Perform Toileting - Clothing Manipulation and hygiene: with modified independence;sitting/lateral leans;sit to/from stand  Plan Discharge plan needs to be updated    Co-evaluation                 AM-PAC OT "6 Clicks" Daily Activity     Outcome Measure   Help from another person eating meals?: A Little Help from another person taking care of personal grooming?: A Little Help from another person toileting, which includes using toliet, bedpan, or urinal?: A Lot Help from another person bathing (including washing, rinsing, drying)?: A Little Help from another person to put on and taking off regular upper body clothing?: A Little Help from another person to put on and taking off regular lower body clothing?: A Little 6 Click Score: 17    End of Session Equipment Utilized During Treatment: Rolling walker  OT Visit Diagnosis: Unsteadiness on feet (R26.81);Other abnormalities of gait and mobility (R26.89);Muscle weakness (generalized) (M62.81);History of falling (Z91.81)   Activity Tolerance Patient limited by fatigue;Treatment limited secondary to medical complications (Comment) (nausea)   Patient Left in bed;with call bell/phone within reach;with bed alarm set   Nurse Communication Mobility status;Other (comment) (nausea)        Time: 6389-3734 OT Time Calculation (min): 26 min  Charges: OT General Charges $OT Visit: 1 Visit OT Treatments $Therapeutic Activity: 23-37 mins  Lorre Munroe, OTR/L   Lorre Munroe 10/24/2019, 12:55 PM

## 2019-10-24 NOTE — Progress Notes (Signed)
[facility] Progress Note  Kelly [name] 64 y.o. 03/31/1955  CC:  Nausea/vomiting, diarrhea  Subjective: Patient reports persistent nausea.  She is mostly spitting up secretions and states she has done so for times) today.  Has not had active vomiting.  Endorses continued diarrhea but denies melena or hematochezia.  Denies abdominal pain.  ROS : Review of Systems  Cardiovascular: Negative for chest pain and palpitations.  Gastrointestinal: Positive for diarrhea, nausea and vomiting. Negative for abdominal pain, blood in stool, constipation, heartburn and melena.   Objective: Vital signs in last 24 hours: Vitals:   10/24/19 0346 10/24/19 0843  BP: (!) 158/60 (!) 173/71  Pulse: 68 92  Resp: 18 18  Temp: 98.9 F (37.2 C) 98.1 F (36.7 C)  SpO2: 97% 95%    Physical Exam:  General:   Lethargic, oriented, cooperative, no distress  Head:  Normocephalic, without obvious abnormality, atraumatic  Eyes:   Anicteric sclera, EOM's intact,   Lungs:   Clear to auscultation bilaterally, respirations unlabored  Heart:  Regular rate and rhythm, S1, S2 normal  Abdomen:   Soft, non-tender, nondistended, bowel sounds active all four quadrants,  no guarding or peritoneal signs  Extremities: Extremities normal, atraumatic, no  edema  Pulses: 2+ and symmetric    Lab Results: Recent Labs    10/22/19 0041 10/22/19 0041 10/23/19 0815 10/24/19 0322  NA 136   < > 136 138  K 3.7   < > 3.2* 3.7  CL 107   < > 107 109  CO2 25   < > 17* 17*  GLUCOSE 77   < > 75 57*  BUN 11   < > 10 8  CREATININE 1.13*   < > 1.07* 1.09*  CALCIUM 7.4*   < > 7.5* 7.8*  MG 1.6*  --  2.0  --    < > = values in this interval not displayed.   Recent Labs    10/23/19 0815 10/24/19 0322  AST 8* 8*  ALT 6 6  ALKPHOS 56 52  BILITOT 0.5 0.9  PROT 5.1* 5.1*  ALBUMIN 2.2* 2.2*   Recent Labs    10/23/19 0815 10/24/19 0322  WBC 10.5 10.1  HGB 11.3* 11.4*  HCT 33.9* 33.6*  MCV 100.9* 102.4*  PLT 129*  137*   No results for input(s): LABPROT, INR in the last 72 hours.    Assessment/Plan: Nausea/vomiting: Await barium swallow results (currently pending).  Await esophageal/gastric biopsies.  Continue pantoprazole 40 meg daily.  Diarrhea: C. difficile and GI pathogen panel negative.  Continue Imodium as needed (2 mg after each episode of diarrhea)  [facility] will follow.   Edrick Kins PA-C 10/24/2019, 10:10 AM  Contact #  [phone]

## 2019-10-24 NOTE — Progress Notes (Signed)
Esophageal biopsies are pending  Barium swallow showed deep penetration, without aspiration; modified barium swallow recommended.  In addition, there was what appeared to be poor esophageal motility, but due to retching and vomiting, not to mention the fact that the patient was unable to stand for the procedure, this was a suboptimal esophageal study.  We will await further input from speech therapy.  Continue dysphagia 1 diet for now.  I am concerned that, given a combination of what appears to be both oropharyngeal and esophageal dysphagia, this patient may end up needing a gastrostomy tube to maintain adequate nutrition.  However, first we need to see what speech therapy comes up with.  Florencia Reasons, M.D. Pager [phone] If no answer or after 5 PM call [phone]

## 2019-10-24 NOTE — Progress Notes (Signed)
Initial Nutrition Assessment  DOCUMENTATION CODES:   Underweight -Suspect protein calorie malnutrition  INTERVENTION:   -Continue Boost Breeze po TID, each supplement provides 250 kcal and 9 grams of protein  If unable to have diet advanced, Tube feeding recommendations: -Osmolite 1.5 @ 20 ml/hr, advance by 10 ml every 8 hours to goal rate of 50 ml/hr.  NUTRITION DIAGNOSIS:   Inadequate oral intake related to dysphagia (esophagitis) as evidenced by meal completion < 25%.  GOAL:   Patient will meet greater than or equal to 90% of their needs  MONITOR:   PO intake, Supplement acceptance, Labs, Weight trends, I & O's, Skin  REASON FOR ASSESSMENT:   Malnutrition Screening Tool    ASSESSMENT:   Chronically ill extremely cachectic 64 year old female with history of COPD, tobacco abuse, chronic diastolic CHF, CAD/PCI and stents, hypertension, dyslipidemia presented to the emergency room 8/20 with worsening weakness, nausea, dysphagia and odynophagia, poor oral intake for over 2 months, this has been slowly progressive.  She has lost about 25 pounds, denies any fevers or chills, continues to smoke a pack of cigarettes a day.-Work-up in the ED noted mild hypotension but improved with saline, severe hypokalemia CT chest concerning for interstitial lung disease and emphysema, CT abdomen suggestive of esophagitis and infectious/inflammatory colitis  Patient underwent esophagram this morning.  Per GI note, MBS is recommended. Pt with poor esophageal motility. Pt had an episode of vomiting during procedure. Pt may possibly need a G-tube. SLP to evaluate further.  Pt was recommended a dysphagia 1 diet per SLP on 8/22. Pt not taking in much given dysphagia for the past 2 months. Food and liquids are difficult to swallow. Boost Cleotis Nipper has been ordered and will continue for now.  Will leave tube feeding recommendations if needed.  Pt reported 20-25 lbs of weight loss recently. Per weight  records, pt has lost 16 lbs since 12/30/18 (13% wt loss x 10 months, insignificant for time frame). However, this is a downward trend for her weights. Suspect some degree of malnutrition given poor PO and weight status. Will need NFPE at follow-up.  Labs reviewed. Medications: Marinol, IV Zofran  NUTRITION - FOCUSED PHYSICAL EXAM:  Unable to complete  Diet Order:   Diet Order            DIET - DYS 1 Room service appropriate? Yes; Fluid consistency: Thin  Diet effective now                 EDUCATION NEEDS:   Not appropriate for education at this time  Skin:  Skin Assessment: Skin Integrity Issues: Skin Integrity Issues:: Unstageable Unstageable: left hip  Last BM:  8/22 -type 7  Height:   Ht Readings from Last 1 Encounters:  10/22/19 5\' 5"  (1.651 m)    Weight:   Wt Readings from Last 1 Encounters:  10/24/19 48.4 kg    BMI:  Body mass index is 17.76 kg/m.  Estimated Nutritional Needs:   Kcal:  1700-1900  Protein:  80-90g  Fluid:  1.7L/day  10/26/19, MS, RD, LDN Inpatient Clinical Dietitian Contact information available via Amion

## 2019-10-24 NOTE — Progress Notes (Signed)
SLP Cancellation Note  Patient Details Name: Kelly [name] MRN: [id number] DOB: 13-Jan-1956   Cancelled treatment:       Reason Eval/Treat Not Completed: Other (comment) (awaiting results of esophagram, will follow up next date)  Rolena Infante, MS [facility] SLP Acute Rehab Services Office [phone]  Chales Abrahams 10/24/2019, 10:42 AM

## 2019-10-24 NOTE — Progress Notes (Signed)
PROGRESS NOTE    Kelly [name]  INO:[id number] DOB: 02/02/1956 DOA: 10/21/2019 PCP: Arvilla Market, DO  Brief Narrative: Chronically ill extremely cachectic 64 year old female with history of COPD, tobacco abuse, chronic diastolic CHF, CAD/PCI and stents, hypertension, dyslipidemia presented to the emergency room 8/20 with worsening weakness, nausea, dysphagia and odynophagia, poor oral intake for over 2 months, this has been slowly progressive.  She has lost about 25 pounds, denies any fevers or chills, continues to smoke a pack of cigarettes a day. -Work-up in the ED noted mild hypotension but improved with saline, severe hypokalemia CT chest concerning for interstitial lung disease and emphysema, CT abdomen suggestive of esophagitis and infectious/inflammatory colitis  Assessment & Plan:   Severe dysphagia/Nausea Severe protein calorie malnutrition/weight loss Adult failure to thrive -minimal po intake, swallowing progressively worsening for months, no overt findings concerning for malignancy based on CT chest/abdomen pelvis [facility] gastroenterology consulting, endoscopy 8/21 was unremarkable, follow-up biopsy, Ba esophagram w/out overt findings, limited evaluation of dysmotility due to patient's nausea and inability to stand -SLP following, plan for MBS -TSH and random cortisol within normal limits -add marinol, scheduled zofran -Dietitian consulted, continue PPI -May need goals of care discussions  Chronic diarrhea -CT abdomen with questionable mild colitis, clinically do not suspect active colitis -Antibiotics discontinued, could be malabsorption related -C. 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 Heavy tobacco abuse -Concern for interstitial lung disease based on CT, no wheezing or oxygen requirement at this time -Counseled, continue nebs as needed -Recommended  need for pulmonary follow-up  CAD History of PCI/stents -Stable  Chronic diastolic CHF -She is not volume overloaded at this time  Unstageable pressure injury left hip -Present on admission, continue wound care and repositioning  DVT prophylaxis: Lovenox Code Status: Full code Family Communication: Discussed with patient in detail, no family at bedside, does not want me to contact her son who has epilepsy Disposition Plan:  Status is: inpatient  Patient remains inpatient due to: Severity of illness  Dispo: The patient is from: Home              Anticipated d/c is to: SNF              Anticipated d/c date is: > 2days              Patient currently is not medically stable to d/c.  Consultants:   [facility]   Procedures:   Antimicrobials:    Subjective: -Still has poor/minimal oral intake, remains nauseated with dry heaving  Objective: Vitals:   10/23/19 2020 10/24/19 0346 10/24/19 0843 10/24/19 1000  BP: (!) 145/52 (!) 158/60 (!) 173/71   Pulse: 62 68 92   Resp: 13 18 18    Temp: (!) 97.5 F (36.4 C) 98.9 F (37.2 C) 98.1 F (36.7 C)   TempSrc: Oral Oral Oral   SpO2: 96% 97% 95%   Weight:    48.4 kg  Height:        Intake/Output Summary (Last 24 hours) at 10/24/2019 1418 Last data filed at 10/24/2019 1000 Gross per 24 hour  Intake 297 ml  Output --  Net 297 ml   Filed Weights   10/24/19 1000  Weight: 48.4 kg    Examination:  General exam: Chronically ill extremely cachectic female appears much older than stated age, awake alert oriented to self place and partly to time HEENT: No JVD CVS: S1-S2, regular rate rhythm  Abdomen: Soft, nontender, bowel sounds present Extremities: No edema  Neuro: Moves all extremities, no localizing findings  Data Reviewed:   CBC: Recent Labs  Lab 10/21/19 1355 10/23/19 0815 10/24/19 0322  WBC 17.8* 10.5 10.1  HGB 14.1 11.3* 11.4*  HCT 41.8 33.9* 33.6*  MCV 101.7* 100.9* 102.4*  PLT 171 129* 137*    Basic Metabolic Panel: Recent Labs  Lab 10/21/19 1355 10/21/19 1730 10/22/19 0041 10/23/19 0815 10/24/19 0322  NA 136  --  136 136 138  K 2.6*  --  3.7 3.2* 3.7  CL 102  --  107 107 109  CO2 22  --  25 17* 17*  GLUCOSE 115*  --  77 75 57*  BUN 12  --  11 10 8   CREATININE 1.14*  --  1.13* 1.07* 1.09*  CALCIUM 8.1*  --  7.4* 7.5* 7.8*  MG  --  1.6* 1.6* 2.0  --    GFR: Estimated Creatinine Clearance: 38.8 mL/min (A) (by C-G formula based on SCr of 1.09 mg/dL (H)). Liver Function Tests: Recent Labs  Lab 10/21/19 1355 10/23/19 0815 10/24/19 0322  AST 10* 8* 8*  ALT 5 6 6   ALKPHOS 69 56 52  BILITOT 0.9 0.5 0.9  PROT 6.1* 5.1* 5.1*  ALBUMIN 2.5* 2.2* 2.2*   Recent Labs  Lab 10/21/19 1355  LIPASE 25   No results for input(s): AMMONIA in the last 168 hours. Coagulation Profile: No results for input(s): INR, PROTIME in the last 168 hours. Cardiac Enzymes: No results for input(s): CKTOTAL, CKMB, CKMBINDEX, TROPONINI in the last 168 hours. BNP (last 3 results) No results for input(s): PROBNP in the last 8760 hours. HbA1C: No results for input(s): HGBA1C in the last 72 hours. CBG: No results for input(s): GLUCAP in the last 168 hours. Lipid Profile: No results for input(s): CHOL, HDL, LDLCALC, TRIG, CHOLHDL, LDLDIRECT in the last 72 hours. Thyroid Function Tests: Recent Labs    10/23/19 1544  TSH 0.616   Anemia Panel: No results for input(s): VITAMINB12, FOLATE, FERRITIN, TIBC, IRON, RETICCTPCT in the last 72 hours. Urine analysis: No results found for: COLORURINE, APPEARANCEUR, LABSPEC, PHURINE, GLUCOSEU, HGBUR, BILIRUBINUR, KETONESUR, PROTEINUR, UROBILINOGEN, NITRITE, LEUKOCYTESUR Sepsis Labs: @LABRCNTIP (procalcitonin:4,lacticidven:4)  ) Recent Results (from the past 240 hour(s))  SARS Coronavirus 2 by RT PCR (hospital order, performed in [facility] hospital lab) Nasopharyngeal Nasopharyngeal Swab     Status: None   Collection Time: 10/21/19  5:01 PM    Specimen: Nasopharyngeal Swab  Result Value Ref Range Status   SARS Coronavirus 2 NEGATIVE NEGATIVE Final    Comment: (NOTE) SARS-CoV-2 target nucleic acids are NOT DETECTED.  The SARS-CoV-2 RNA is generally detectable in upper and lower respiratory specimens during the acute phase of infection. The lowest concentration of SARS-CoV-2 viral copies this assay can detect is 250 copies / mL. A negative result does not preclude SARS-CoV-2 infection and should not be used as the sole basis for treatment or other patient management decisions.  A negative result may occur with improper specimen collection / handling, submission of specimen other than nasopharyngeal swab, presence of viral mutation(s) within the areas targeted by this assay, and inadequate number of viral copies (<250 copies / mL). A negative result must be combined with clinical observations, patient history, and epidemiological information.  Fact Sheet for Patients:    Fact Sheet for Healthcare Providers: [facility]  This test is not yet approved or  cleared by the 10/23/19 FDA and has been authorized  for detection and/or diagnosis of SARS-CoV-2 by FDA under an Emergency Use Authorization (EUA).  This EUA will remain in effect (meaning this test can be used) for the duration of the COVID-19 declaration under Section 564(b)(1) of the Act, 21 U.S.C. section 360bbb-3(b)(1), unless the authorization is terminated or revoked sooner.  Performed at [facility], [address] [place], [place] [address]   Culture, blood (routine x 2)     Status: None (Preliminary result)   Collection Time: 10/22/19 12:41 AM   Specimen: BLOOD  Result Value Ref Range Status   Specimen Description BLOOD LEFT ANTECUBITAL  Final   Special Requests   Final    BOTTLES DRAWN AEROBIC AND ANAEROBIC Blood Culture adequate volume   Culture   Final    NO GROWTH 2  DAYS Performed at [facility], [address], [place], [place] [address]    Report Status PENDING  Incomplete  Culture, blood (routine x 2)     Status: None (Preliminary result)   Collection Time: 10/22/19 12:47 AM   Specimen: BLOOD RIGHT HAND  Result Value Ref Range Status   Specimen Description BLOOD RIGHT HAND  Final   Special Requests   Final    BOTTLES DRAWN AEROBIC ONLY Blood Culture adequate volume   Culture   Final    NO GROWTH 2 DAYS Performed at [facility], [address], [place], [place] [address]    Report Status PENDING  Incomplete  Gastrointestinal Panel by PCR , Stool     Status: None   Collection Time: 10/22/19  4:45 PM   Specimen: STOOL  Result Value Ref Range Status   Campylobacter species NOT DETECTED NOT DETECTED Final   Plesimonas shigelloides NOT DETECTED NOT DETECTED Final   Salmonella species NOT DETECTED NOT DETECTED Final   Yersinia enterocolitica NOT DETECTED NOT DETECTED Final   Vibrio species NOT DETECTED NOT DETECTED Final   Vibrio cholerae NOT DETECTED NOT DETECTED Final   Enteroaggregative E coli (EAEC) NOT DETECTED NOT DETECTED Final   Enteropathogenic E coli (EPEC) NOT DETECTED NOT DETECTED Final   Enterotoxigenic E coli (ETEC) NOT DETECTED NOT DETECTED Final   Shiga like toxin producing E coli (STEC) NOT DETECTED NOT DETECTED Final   Shigella/Enteroinvasive E coli (EIEC) NOT DETECTED NOT DETECTED Final   Cryptosporidium NOT DETECTED NOT DETECTED Final   Cyclospora cayetanensis NOT DETECTED NOT DETECTED Final   Entamoeba histolytica NOT DETECTED NOT DETECTED Final   Giardia lamblia NOT DETECTED NOT DETECTED Final   Adenovirus F40/41 NOT DETECTED NOT DETECTED Final   Astrovirus NOT DETECTED NOT DETECTED Final   Norovirus GI/GII NOT DETECTED NOT DETECTED Final   Rotavirus A NOT DETECTED NOT DETECTED Final   Sapovirus (I, II, IV, and V) NOT DETECTED NOT DETECTED Final    Comment: Performed at [facility], [address] [place], [place] [address]  C Difficile Quick Screen w PCR reflex     Status: None   Collection Time: 10/22/19  4:45 PM   Specimen: STOOL  Result Value Ref Range Status   C Diff antigen NEGATIVE NEGATIVE Final   C Diff toxin NEGATIVE NEGATIVE Final   C Diff interpretation No C. difficile detected.  Final    Comment: Performed at [facility], [address] [place], [place] [address]         Radiology Studies: DG ESOPHAGUS W SINGLE CM (SOL OR THIN BA)  Result Date: 10/24/2019 CLINICAL DATA:  Dysphagia EXAM: ESOPHOGRAM/BARIUM SWALLOW TECHNIQUE: Single contrast examination was performed using  thin barium. FLUOROSCOPY TIME:  Fluoroscopy Time:  1 minutes 30 seconds Radiation Exposure Index (if provided by the fluoroscopic device): 5.80 Number of Acquired Spot Images: 1 COMPARISON:  Chest CT from October 21, 2019 FINDINGS: Thin barium was administered in small swallows were performed in oblique and lateral positioning at the initial portion of the exam which showed deep penetration and coating of the larynx with barium contrast material. No frank aspiration was noted though larger volume swallows were not performed due to this finding. Initial images show changes of C4-5 spinal fusion with ACDF, partially visualized. Esophageal dysmotility with suggested on subsequent imaging. Mucosal assessment not possible and evidence of retained material in the esophagus with tertiary peristalsis. After the initial 2-3 swallows the patient began to rash and vomit small quantities of ingested material. For this reason the examination was completed and the patient was returned to the floor. IMPRESSION: 1. Signs of deep penetration and coating of the larynx with barium contrast material. No frank aspiration was noted though larger volume swallows were not performed. Dedicated swallow study is suggested for further evaluation. 2. Esophageal dysmotility. Very limited exam due to patient's inability to stand  for the evaluation and due to nausea and small volume emesis. Electronically Signed   By: Donzetta Kohut M.D.   On: 10/24/2019 11:09        Scheduled Meds: . collagenase   Topical Daily  . dronabinol  2.5 mg Oral BID AC  . enoxaparin (LOVENOX) injection  40 mg Subcutaneous Q24H  . feeding supplement  1 Container Oral TID BM  . levothyroxine  12.5 mcg Intravenous Daily  . nicotine  21 mg Transdermal Daily  . ondansetron (ZOFRAN) IV  4 mg Intravenous TID AC  . pantoprazole  40 mg Oral Q1200   Continuous Infusions:    LOS: 2 days    Time spent:  Zannie Cove, MD [facility]  10/24/2019, 2:18 PM

## 2019-10-25 ENCOUNTER — Ambulatory Visit: Payer: Medicare HMO | Admitting: Cardiovascular Disease

## 2019-10-25 ENCOUNTER — Inpatient Hospital Stay (HOSPITAL_COMMUNITY): Payer: Medicare HMO

## 2019-10-25 ENCOUNTER — Other Ambulatory Visit: Payer: Self-pay | Admitting: Physician Assistant

## 2019-10-25 LAB — COMPREHENSIVE METABOLIC PANEL
ALT: 7 U/L (ref 0–44)
AST: 8 U/L — ABNORMAL LOW (ref 15–41)
Albumin: 2.1 g/dL — ABNORMAL LOW (ref 3.5–5.0)
Alkaline Phosphatase: 42 U/L (ref 38–126)
Anion gap: 7 (ref 5–15)
BUN: 14 mg/dL (ref 8–23)
CO2: 22 mmol/L (ref 22–32)
Calcium: 8 mg/dL — ABNORMAL LOW (ref 8.9–10.3)
Chloride: 106 mmol/L (ref 98–111)
Creatinine, Ser: 1.07 mg/dL — ABNORMAL HIGH (ref 0.44–1.00)
GFR calc Af Amer: >60 mL/min (ref >60)
GFR calc non Af Amer: 54 mL/min — ABNORMAL LOW (ref >60)
Glucose, Bld: 131 mg/dL — ABNORMAL HIGH (ref 70–99)
Potassium: 3.3 mmol/L — ABNORMAL LOW (ref 3.5–5.1)
Sodium: 135 mmol/L (ref 135–145)
Total Bilirubin: 0.5 mg/dL (ref 0.3–1.2)
Total Protein: 4.7 g/dL — ABNORMAL LOW (ref 6.5–8.1)

## 2019-10-25 LAB — CBC
HCT: 31.3 % — ABNORMAL LOW (ref 36.0–46.0)
Hemoglobin: 10.6 g/dL — ABNORMAL LOW (ref 12.0–15.0)
MCH: 34.3 pg — ABNORMAL HIGH (ref 26.0–34.0)
MCHC: 33.9 g/dL (ref 30.0–36.0)
MCV: 101.3 fL — ABNORMAL HIGH (ref 80.0–100.0)
Platelets: 128 10*3/uL — ABNORMAL LOW (ref 150–400)
RBC: 3.09 MIL/uL — ABNORMAL LOW (ref 3.87–5.11)
RDW: 13.3 % (ref 11.5–15.5)
WBC: 10.2 10*3/uL (ref 4.0–10.5)
nRBC: 0 % (ref 0.0–0.2)

## 2019-10-25 LAB — SURGICAL PATHOLOGY

## 2019-10-25 MED ORDER — FLUCONAZOLE 100MG IVPB
100.0000 mg | INTRAVENOUS | Status: DC
  Administered 2019-10-26 – 2019-10-28 (×3): 100 mg via INTRAVENOUS
  Filled 2019-10-25 (×4): qty 50

## 2019-10-25 MED ORDER — POTASSIUM CHLORIDE 20 MEQ/15ML (10%) PO SOLN
40.0000 meq | Freq: Once | ORAL | Status: AC
  Administered 2019-10-25: 40 meq via ORAL
  Filled 2019-10-25: qty 30

## 2019-10-25 MED ORDER — FLUCONAZOLE IN SODIUM CHLORIDE 200-0.9 MG/100ML-% IV SOLN
200.0000 mg | Freq: Once | INTRAVENOUS | Status: AC
  Administered 2019-10-25: 200 mg via INTRAVENOUS
  Filled 2019-10-25: qty 100

## 2019-10-25 NOTE — Progress Notes (Signed)
Modified Barium Swallow Progress Note  Patient Details  Name: Kelly [name] MRN: [id number] Date of Birth: 03/04/1955  Today's Date: 10/25/2019  Modified Barium Swallow completed.  Full report located under Chart Review in the Imaging Section.  Brief recommendations include the following:  Clinical Impression  Pharyngoesophageal dysphagia evidenced with deficits in both swallow safety and efficiency. Cervical hardware noted C4/C5 from past cervical surgery with posterior pharyngeal wall anatomical position more anteriorly at level of hardware. This suspected to contribute to incomplete epiglottic deflection which negatively impacting laryngeal vestibule closure. Deficits also noted in UES distention, base of tongue retraction, hyoid excursion, and diminished sensation. Pt with frequent during the swallow laryngeal penetration and frequent post swallow aspiration of liquid viscosities (thin, nectar thick, and honey thick liquids); inconsisently sensed. Vallecular and pyriform sinus residuals noted that spilled post swallow (worsened with thicker consistencies). Suspect dysphagia has been ongoing with chronic episodic aspiration. Chin tuck ineffective at improving airway protection. Swallow safety was maximized by smaller sips/bites, multiple swallows, and use of throat clear and or cough. Pt with barium tablet retention in distal esophagus that eventually passed into the stomach, barium retention exhibited as consistent with barium swallow evaluation.  Recommend continue dysphagia 1 (puree) thin liquids with medicines in puree, crush larger.     Swallow Evaluation Recommendations       SLP Diet Recommendations: Dysphagia 1 (Puree) solids;Thin liquid   Liquid Administration via: Cup   Medication Administration: Whole meds with puree   Supervision: Intermittent supervision to cue for compensatory strategies   Compensations: Slow rate;Small sips/bites;Follow solids with liquid;Multiple dry swallows  after each bite/sip;Clear throat intermittently   Postural Changes: Seated upright at 90 degrees;Remain semi-upright after after feeds/meals (Comment)   Oral Care Recommendations: Oral care BID        Leopold Smyers E Etter Royall MA, CCC-SLP  10/25/2019,3:47 PM

## 2019-10-25 NOTE — Progress Notes (Signed)
   10/25/19 1843  Unmeasured Output  Urine Occurrence 1  Stool Occurrence 1  Stool Characteristics  Bowel Incontinence Yes  Stool Type Type 5 (Soft blobs with clear-cut edges)  Stool Descriptors Brown  Stool Amount Medium  Stool Source Rectum

## 2019-10-25 NOTE — Progress Notes (Signed)
PT Cancellation Note  Patient Details Name: Kelly [name] MRN: [id number] DOB: Mar 19, 1955   Cancelled Treatment:    Reason Eval/Treat Not Completed: Other (comment). Pt is out of the room to a procedure, will retry at another time.   Ivar Drape 10/25/2019, 1:04 PM   Samul Dada, PT MS Acute Rehab Dept. Number: [facility] [id number] and [facility] [phone]

## 2019-10-25 NOTE — Progress Notes (Signed)
Chaplain responded to Spiritual Consult to deliver and explain AD.  Chaplain found patient sleeping.  Consulted  with pt's nurse that it was okay to wake the patient.  Patient was slow to arouse.  Patient expressed understanding of what the function of an AD is and named the person she would want to name on the AD.  Patient expressed it would take 2-3 days to speak with the person and she would have Chaplain recalled when she had completed AD.  Chaplain available to follow up as needed.  Vernell Morgans Chaplain

## 2019-10-25 NOTE — Progress Notes (Addendum)
[facility] Progress Note  Kelly [name] 64 y.o. 03-Jun-1955  CC:  Nausea/vomiting, diarrhea  Subjective: Patient sleeping comfortably.  Objective: Vital signs in last 24 hours: Vitals:   10/25/19 0422 10/25/19 0944  BP: (!) 137/50 (!) 141/62  Pulse: 70 98  Resp: 18 14  Temp: 99.5 F (37.5 C) 99.3 F (37.4 C)  SpO2: 97% 99%    Physical Exam:  General:  Sleeping comfortably, no acute distress  Head:  Normocephalic, without obvious abnormality, atraumatic    Lab Results: Recent Labs    10/23/19 0815 10/23/19 0815 10/24/19 0322 10/25/19 0632  NA 136   < > 138 135  K 3.2*   < > 3.7 3.3*  CL 107   < > 109 106  CO2 17*   < > 17* 22  GLUCOSE 75   < > 57* 131*  BUN 10   < > 8 14  CREATININE 1.07*   < > 1.09* 1.07*  CALCIUM 7.5*   < > 7.8* 8.0*  MG 2.0  --   --   --    < > = values in this interval not displayed.   Recent Labs    10/24/19 0322 10/25/19 0632  AST 8* 8*  ALT 6 7  ALKPHOS 52 42  BILITOT 0.9 0.5  PROT 5.1* 4.7*  ALBUMIN 2.2* 2.1*   Recent Labs    10/24/19 0322 10/25/19 0632  WBC 10.1 10.2  HGB 11.4* 10.6*  HCT 33.6* 31.3*  MCV 102.4* 101.3*  PLT 137* 128*   No results for input(s): LABPROT, INR in the last 72 hours.   Assessment: Nausea/vomiting: Esophageal dysmotility per barium swallow 8/23.  Speech swallow study pending.  Biopsies from EGD 10/22/19: Reactive gastropathy; negative for Helicobacter pylori. Acute esophagitis, Candida esophagitis.   Diflucan IV: 200mg  x 1 day, then 100 mg daily x 6 days.  Diarrhea: C. difficile and GI pathogen panel negative.  Continue Imodium as needed (2 mg after each episode of diarrhea).    Plan: Continue pantoprazole 40 mg daily.  Start Diflucan: 200mg  x 1 day, then 100 mg daily x 6 days.  IV dosing due to swallowing dysfunction.  Order placed for stool documentation.  If diarrhea well-controlled with imodium, will defer colonoscopy. If persistent diarrhea despite imodium, patient may need  colonoscopy w/ biopsies to rule out microscopic colitis.  [facility] will follow.  PA-C 10/25/2019, 11:22 AM  Contact #  [phone]

## 2019-10-25 NOTE — Progress Notes (Signed)
PROGRESS NOTE    Kelly [name]  YKD:[id number] DOB: 05-Feb-1956 DOA: 10/21/2019 PCP: Arvilla Market, DO  Brief Narrative: Chronically ill extremely cachectic 64 year old female with history of COPD, tobacco abuse, chronic diastolic CHF, CAD/PCI and stents, hypertension, dyslipidemia presented to the emergency room 8/20 with worsening weakness, nausea, dysphagia and odynophagia, poor oral intake for over 2 months, this has been slowly progressive.  She has lost about 25 pounds, denies any fevers or chills, continues to smoke a pack of cigarettes a day. -Work-up in the ED noted mild hypotension but improved with saline, severe hypokalemia CT chest concerning for interstitial lung disease and emphysema, CT abdomen suggestive of esophagitis and infectious/inflammatory colitis -[facility] following , EGD was unremarkable, biopsy positive for Candida -Barium esophagram/evaluation for dysmotility was limited -MBS today  Assessment & Plan:   Dysphagia/Nausea Severe protein calorie malnutrition/weight loss Adult failure to thrive -History of dysphagia and nausea progressively worsening for months, no overt findings concerning for malignancy based on CT chest/abdomen pelvis [facility] gastroenterology consulting, endoscopy 8/21 was unremarkable, follow-up biopsy, Ba esophagram w/out overt findings, limited evaluation of dysmotility due to patient's nausea and inability to stand -candida noted on path-starting Fluconazole -SLP following, plan for MBS this afternoon -TSH and random cortisol within normal limits -started marinol, scheduled zofran -Dietitian consulted, continue PPI -Po intake starting to improving, had a third of her breakfast this am, will monitor  Chronic diarrhea -CT abdomen with questionable mild colitis, clinically do not suspect active colitis -Antibiotics discontinued, could be malabsorption related -C. 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 Heavy tobacco abuse -Concern for interstitial lung disease based on CT, no wheezing or oxygen requirement at this time -Counseled, continue nebs as needed -Recommended need for pulmonary follow-up  CAD History of PCI/stents -Stable  Chronic diastolic CHF -She is not volume overloaded at this time  Unstageable pressure injury left hip -Present on admission, continue wound care and repositioning  DVT prophylaxis: Lovenox Code Status: Full code Family Communication: Discussed with patient in detail, no family at bedside, does not want me to contact her son who has epilepsy and anxiety Disposition Plan:  Status is: inpatient  Patient remains inpatient due to: Severity of illness  Dispo: The patient is from: Home              Anticipated d/c is to: SNF vs Home w/ [facility]              Anticipated d/c date is: > 2days              Patient currently is not medically stable to d/c.  Consultants:   [facility]   Procedures:   Antimicrobials:    Subjective: -Feels a little better today, starting to eat a little, appetite is improving  Objective: Vitals:   10/24/19 1938 10/24/19 2017 10/25/19 0422 10/25/19 0944  BP:  (!) 133/55 (!) 137/50 (!) 141/62  Pulse:  86 70 98  Resp:  20 18 14   Temp:  99.4 F (37.4 C) 99.5 F (37.5 C) 99.3 F (37.4 C)  TempSrc:  Oral Oral Oral  SpO2:  97% 97% 99%  Weight: 49.1 kg     Height:        Intake/Output Summary (Last 24 hours) at 10/25/2019 1435 Last data filed at 10/25/2019 1000 Gross per 24 hour  Intake 957 ml  Output --  Net 957 ml   [employer]  10/24/19 1000 10/24/19 1938  Weight: 48.4 kg 49.1 kg    Examination:  General exam: Chronically ill cachectic female appears much older than stated age, awake alert oriented to self place and partly to time HEENT: No JVD CVS: S1-S2, regular rate rhythm Abdomen: Soft, nontender,  bowel sounds present Extremities: No edema  Neuro: Moves all extremities, no localizing findings  Data Reviewed:   CBC: Recent Labs  Lab 10/21/19 1355 10/23/19 0815 10/24/19 0322 10/25/19 0632  WBC 17.8* 10.5 10.1 10.2  HGB 14.1 11.3* 11.4* 10.6*  HCT 41.8 33.9* 33.6* 31.3*  MCV 101.7* 100.9* 102.4* 101.3*  PLT 171 129* 137* 128*   Basic Metabolic Panel: Recent Labs  Lab 10/21/19 1355 10/21/19 1730 10/22/19 0041 10/23/19 0815 10/24/19 0322 10/25/19 0632  NA 136  --  136 136 138 135  K 2.6*  --  3.7 3.2* 3.7 3.3*  CL 102  --  107 107 109 106  CO2 22  --  25 17* 17* 22  GLUCOSE 115*  --  77 75 57* 131*  BUN 12  --  11 10 8 14   CREATININE 1.14*  --  1.13* 1.07* 1.09* 1.07*  CALCIUM 8.1*  --  7.4* 7.5* 7.8* 8.0*  MG  --  1.6* 1.6* 2.0  --   --    GFR: Estimated Creatinine Clearance: 40.1 mL/min (A) (by C-G formula based on SCr of 1.07 mg/dL (H)). Liver Function Tests: Recent Labs  Lab 10/21/19 1355 10/23/19 0815 10/24/19 0322 10/25/19 0632  AST 10* 8* 8* 8*  ALT 5 6 6 7   ALKPHOS 69 56 52 42  BILITOT 0.9 0.5 0.9 0.5  PROT 6.1* 5.1* 5.1* 4.7*  ALBUMIN 2.5* 2.2* 2.2* 2.1*   Recent Labs  Lab 10/21/19 1355  LIPASE 25   No results for input(s): AMMONIA in the last 168 hours. Coagulation Profile: No results for input(s): INR, PROTIME in the last 168 hours. Cardiac Enzymes: No results for input(s): CKTOTAL, CKMB, CKMBINDEX, TROPONINI in the last 168 hours. BNP (last 3 results) No results for input(s): PROBNP in the last 8760 hours. HbA1C: No results for input(s): HGBA1C in the last 72 hours. CBG: No results for input(s): GLUCAP in the last 168 hours. Lipid Profile: No results for input(s): CHOL, HDL, LDLCALC, TRIG, CHOLHDL, LDLDIRECT in the last 72 hours. Thyroid Function Tests: Recent Labs    10/23/19 1544  TSH 0.616   Anemia Panel: No results for input(s): VITAMINB12, FOLATE, FERRITIN, TIBC, IRON, RETICCTPCT in the last 72 hours. Urine  analysis: No results found for: COLORURINE, APPEARANCEUR, LABSPEC, PHURINE, GLUCOSEU, HGBUR, BILIRUBINUR, KETONESUR, PROTEINUR, UROBILINOGEN, NITRITE, LEUKOCYTESUR Sepsis Labs: @LABRCNTIP (procalcitonin:4,lacticidven:4)  ) Recent Results (from the past 240 hour(s))  SARS Coronavirus 2 by RT PCR (hospital order, performed in [facility] hospital lab) Nasopharyngeal Nasopharyngeal Swab     Status: None   Collection Time: 10/21/19  5:01 PM   Specimen: Nasopharyngeal Swab  Result Value Ref Range Status   SARS Coronavirus 2 NEGATIVE NEGATIVE Final    Comment: (NOTE) SARS-CoV-2 target nucleic acids are NOT DETECTED.  The SARS-CoV-2 RNA is generally detectable in upper and lower respiratory specimens during the acute phase of infection. The lowest concentration of SARS-CoV-2 viral copies this assay can detect is 250 copies / mL. A negative result does not preclude SARS-CoV-2 infection and should not be used as the sole basis for treatment or other patient management decisions.  A negative result may occur with improper specimen collection / handling, submission of specimen  other than nasopharyngeal swab, presence of viral mutation(s) within the areas targeted by this assay, and inadequate number of viral copies (<250 copies / mL). A negative result must be combined with clinical observations, patient history, and epidemiological information.  Fact Sheet for Patients:   BoilerBrush.com.cy  Fact Sheet for Healthcare Providers: https://pope.com/  This test is not yet approved or  cleared by the [place] FDA and has been authorized for detection and/or diagnosis of SARS-CoV-2 by FDA under an Emergency Use Authorization (EUA).  This EUA will remain in effect (meaning this test can be used) for the duration of the COVID-19 declaration under Section 564(b)(1) of the Act, 21 U.S.C. section 360bbb-3(b)(1), unless the authorization is terminated  or revoked sooner.  Performed at [facility], [address] [place], [place] [address]   Culture, blood (routine x 2)     Status: None (Preliminary result)   Collection Time: 10/22/19 12:41 AM   Specimen: BLOOD  Result Value Ref Range Status   Specimen Description BLOOD LEFT ANTECUBITAL  Final   Special Requests   Final    BOTTLES DRAWN AEROBIC AND ANAEROBIC Blood Culture adequate volume   Culture   Final    NO GROWTH 3 DAYS Performed at [facility], [address], [place], [place] [address]    Report Status PENDING  Incomplete  Culture, blood (routine x 2)     Status: None (Preliminary result)   Collection Time: 10/22/19 12:47 AM   Specimen: BLOOD RIGHT HAND  Result Value Ref Range Status   Specimen Description BLOOD RIGHT HAND  Final   Special Requests   Final    BOTTLES DRAWN AEROBIC ONLY Blood Culture adequate volume   Culture   Final    NO GROWTH 3 DAYS Performed at [facility], [address] [place], [place] [address]    Report Status PENDING  Incomplete  Gastrointestinal Panel by PCR , Stool     Status: None   Collection Time: 10/22/19  4:45 PM   Specimen: STOOL  Result Value Ref Range Status   Campylobacter species NOT DETECTED NOT DETECTED Final   Plesimonas shigelloides NOT DETECTED NOT DETECTED Final   Salmonella species NOT DETECTED NOT DETECTED Final   Yersinia enterocolitica NOT DETECTED NOT DETECTED Final   Vibrio species NOT DETECTED NOT DETECTED Final   Vibrio cholerae NOT DETECTED NOT DETECTED Final   Enteroaggregative E coli (EAEC) NOT DETECTED NOT DETECTED Final   Enteropathogenic E coli (EPEC) NOT DETECTED NOT DETECTED Final   Enterotoxigenic E coli (ETEC) NOT DETECTED NOT DETECTED Final   Shiga like toxin producing E coli (STEC) NOT DETECTED NOT DETECTED Final   Shigella/Enteroinvasive E coli (EIEC) NOT DETECTED NOT DETECTED Final   Cryptosporidium NOT DETECTED NOT DETECTED Final   Cyclospora cayetanensis NOT DETECTED NOT  DETECTED Final   Entamoeba histolytica NOT DETECTED NOT DETECTED Final   Giardia lamblia NOT DETECTED NOT DETECTED Final   Adenovirus F40/41 NOT DETECTED NOT DETECTED Final   Astrovirus NOT DETECTED NOT DETECTED Final   Norovirus GI/GII NOT DETECTED NOT DETECTED Final   Rotavirus A NOT DETECTED NOT DETECTED Final   Sapovirus (I, II, IV, and V) NOT DETECTED NOT DETECTED Final    Comment: Performed at [facility], [address], [place], [place] [address]  C Difficile Quick Screen w PCR reflex     Status: None   Collection Time: 10/22/19  4:45 PM   Specimen: STOOL  Result Value  Ref Range Status   C Diff antigen NEGATIVE NEGATIVE Final   C Diff toxin NEGATIVE NEGATIVE Final   C Diff interpretation No C. difficile detected.  Final    Comment: Performed at [facility], [address], [place], [place] [address]         Radiology Studies: DG ESOPHAGUS W SINGLE CM (SOL OR THIN BA)  Result Date: 10/24/2019 CLINICAL DATA:  Dysphagia EXAM: ESOPHOGRAM/BARIUM SWALLOW TECHNIQUE: Single contrast examination was performed using  thin barium. FLUOROSCOPY TIME:  Fluoroscopy Time:  1 minutes 30 seconds Radiation Exposure Index (if provided by the fluoroscopic device): 5.80 Number of Acquired Spot Images: 1 COMPARISON:  Chest CT from October 21, 2019 FINDINGS: Thin barium was administered in small swallows were performed in oblique and lateral positioning at the initial portion of the exam which showed deep penetration and coating of the larynx with barium contrast material. No frank aspiration was noted though larger volume swallows were not performed due to this finding. Initial images show changes of C4-5 spinal fusion with ACDF, partially visualized. Esophageal dysmotility with suggested on subsequent imaging. Mucosal assessment not possible and evidence of retained material in the esophagus with tertiary peristalsis. After the initial 2-3 swallows the patient began to rash and vomit  small quantities of ingested material. For this reason the examination was completed and the patient was returned to the floor. IMPRESSION: 1. Signs of deep penetration and coating of the larynx with barium contrast material. No frank aspiration was noted though larger volume swallows were not performed. Dedicated swallow study is suggested for further evaluation. 2. Esophageal dysmotility. Very limited exam due to patient's inability to stand for the evaluation and due to nausea and small volume emesis. Electronically Signed   By: Donzetta Kohut M.D.   On: 10/24/2019 11:09        Scheduled Meds: . collagenase   Topical Daily  . dronabinol  2.5 mg Oral BID AC  . enoxaparin (LOVENOX) injection  40 mg Subcutaneous Q24H  . feeding supplement  1 Container Oral TID BM  . levothyroxine  12.5 mcg Intravenous Daily  . nicotine  21 mg Transdermal Daily  . ondansetron (ZOFRAN) IV  4 mg Intravenous TID AC  . pantoprazole  40 mg Oral Q1200   Continuous Infusions: . [START ON 10/26/2019] fluconazole (DIFLUCAN) IV    . fluconazole (DIFLUCAN) IV 200 mg (10/25/19 1421)     LOS: 3 days   Time spent:  Zannie Cove, MD [facility]  10/25/2019, 2:35 PM

## 2019-10-26 LAB — COMPREHENSIVE METABOLIC PANEL
ALT: 7 U/L (ref 0–44)
AST: 7 U/L — ABNORMAL LOW (ref 15–41)
Albumin: 1.9 g/dL — ABNORMAL LOW (ref 3.5–5.0)
Alkaline Phosphatase: 38 U/L (ref 38–126)
Anion gap: 6 (ref 5–15)
BUN: 16 mg/dL (ref 8–23)
CO2: 23 mmol/L (ref 22–32)
Calcium: 7.6 mg/dL — ABNORMAL LOW (ref 8.9–10.3)
Chloride: 107 mmol/L (ref 98–111)
Creatinine, Ser: 0.93 mg/dL (ref 0.44–1.00)
GFR calc Af Amer: >60 mL/min (ref >60)
GFR calc non Af Amer: >60 mL/min (ref >60)
Glucose, Bld: 112 mg/dL — ABNORMAL HIGH (ref 70–99)
Potassium: 3.6 mmol/L (ref 3.5–5.1)
Sodium: 136 mmol/L (ref 135–145)
Total Bilirubin: 0.3 mg/dL (ref 0.3–1.2)
Total Protein: 4.5 g/dL — ABNORMAL LOW (ref 6.5–8.1)

## 2019-10-26 LAB — CBC
HCT: 30.1 % — ABNORMAL LOW (ref 36.0–46.0)
Hemoglobin: 10.2 g/dL — ABNORMAL LOW (ref 12.0–15.0)
MCH: 34.5 pg — ABNORMAL HIGH (ref 26.0–34.0)
MCHC: 33.9 g/dL (ref 30.0–36.0)
MCV: 101.7 fL — ABNORMAL HIGH (ref 80.0–100.0)
Platelets: 104 10*3/uL — ABNORMAL LOW (ref 150–400)
RBC: 2.96 MIL/uL — ABNORMAL LOW (ref 3.87–5.11)
RDW: 13.4 % (ref 11.5–15.5)
WBC: 8.8 10*3/uL (ref 4.0–10.5)
nRBC: 0 % (ref 0.0–0.2)

## 2019-10-26 LAB — GLUCOSE, CAPILLARY: Glucose-Capillary: 291 mg/dL — ABNORMAL HIGH (ref 70–99)

## 2019-10-26 MED ORDER — PREGABALIN 75 MG PO CAPS
75.0000 mg | ORAL_CAPSULE | Freq: Two times a day (BID) | ORAL | Status: DC
  Administered 2019-10-26 – 2019-11-05 (×21): 75 mg via ORAL
  Filled 2019-10-26 (×21): qty 1

## 2019-10-26 MED ORDER — TOPIRAMATE 25 MG PO TABS
25.0000 mg | ORAL_TABLET | Freq: Two times a day (BID) | ORAL | Status: DC
  Administered 2019-10-26 – 2019-11-01 (×13): 25 mg via ORAL
  Filled 2019-10-26 (×14): qty 1

## 2019-10-26 MED ORDER — CITALOPRAM HYDROBROMIDE 20 MG PO TABS
20.0000 mg | ORAL_TABLET | Freq: Every day | ORAL | Status: DC
  Administered 2019-10-26 – 2019-11-04 (×10): 20 mg via ORAL
  Filled 2019-10-26 (×10): qty 1

## 2019-10-26 MED ORDER — DICLOFENAC SODIUM 1 % EX GEL
4.0000 g | Freq: Four times a day (QID) | CUTANEOUS | Status: DC
  Administered 2019-10-26 – 2019-11-01 (×26): 4 g via TOPICAL
  Filled 2019-10-26: qty 100

## 2019-10-26 MED ORDER — LEVOTHYROXINE SODIUM 25 MCG PO TABS
25.0000 ug | ORAL_TABLET | Freq: Every day | ORAL | Status: DC
  Administered 2019-10-27 – 2019-11-05 (×9): 25 ug via ORAL
  Filled 2019-10-26 (×9): qty 1

## 2019-10-26 NOTE — Progress Notes (Signed)
   10/26/19 0400  Gastrointestinal  Abdomen Inspection Soft  Bowel Sounds Assessment Active  Tenderness Nontender  Stool Characteristics  Bowel Incontinence Yes  Stool Type Type 6 (Mushy consistency with ragged edges)  Stool Descriptors Yellow  Stool Amount Large  Stool Source Rectum  Has the patient had three Type 7 stools in the last 24 hours? Yes (Noninfectious cause of loose stool is known)

## 2019-10-26 NOTE — Progress Notes (Signed)
Physical Therapy Treatment Patient Details Name: Kelly [name] MRN: [id number] DOB: [birth year] Today's Date: 10/26/2019    History of Present Illness 64 yo female presenting to ED with weakness, nausea, and poor oral intake. CT chest concerning for interstitial lung disease and emphysema, CT abdomen suggestive of esophagitis and infectious/inflammatory colitis. PMH including COPD, tobacco abuse, chronic diastolic CHF, CAD/PCI and stents, HTN, and dyslipidemia.    PT Comments    Patient was received laying in bed talking to MD. When assessing patient's LE strength, noted she had a bowel movement in the bed. She transferred supine to sit then transferred to stand at [facility] with significant trunk flexion and increased time. Stood while getting cleaned up for 1-2 mins. Attempted to stand pivot transfer into the chair. She was able to perform sit <> stand transfer, however had difficulty taking steps towards chair. As she was leaning with her forearms on the RW, unable to take steps to pivot, and was standing with inadequate balance, it was decided that she would likely not be able to stand pivot transfer to the chair safely. I asked the patient to sit back down on the bed and performed a squat-pivot transfer into the chair due to the fact that she was not able to take any steps to move towards the chair. She was left with all needs within reach and chair alarm set about to start eating lunch.   She would continue to benefit from skilled PT to address impairments in: strength, balance, mobility, gait, and overall functioning. Continue to recommend f/u with SNF after d/c or HHPT if 24/7 assistance will be provided at home or if pt refuses SNF. If going home recommend RW, 3in1 chair, wheelchair, and potentially an ambulance transport home as she has a flight of stairs to get to her bedroom and bathroom.    Follow Up Recommendations  SNF;Supervision for mobility/OOB     Equipment Recommendations  Rolling walker  with 5" wheels;3in1 (PT);Other (comment) (wheelchair if d/c home)    Recommendations for Other Services       Precautions / Restrictions Precautions Precautions: Fall Restrictions Weight Bearing Restrictions: No    Mobility  Bed Mobility Overal bed mobility: Needs Assistance Bed Mobility: Supine to Sit     Supine to sit: Supervision;HOB elevated     General bed mobility comments: Supervision with increased time  Transfers Overall transfer level: Needs assistance   Transfers: Sit to/from Stand;Squat Pivot Transfers Sit to Stand: Min assist   Squat pivot transfers: Max assist     General transfer comment: Sit to stand with minAx1 with increased time. Attempt to stand pivot, however patient was unable to take steps towards chair- decided squat pivot transfer would be safer option. MaxAx1 for squat pivot to help with patient's weight shift and block patient's knees.  Ambulation/Gait             General Gait Details: unable   Stairs             Wheelchair Mobility    Modified Rankin (Stroke Patients Only)       Balance Overall balance assessment: Needs assistance;History of Falls Sitting-balance support: No upper extremity supported;Feet supported Sitting balance-Leahy Scale: Fair     Standing balance support: Bilateral upper extremity supported Standing balance-Leahy Scale: Poor Standing balance comment: Reliant on UE support, trunk very flexed  Cognition Arousal/Alertness: Awake/alert Behavior During Therapy: WFL for tasks assessed/performed Overall Cognitive Status: No family/caregiver present to determine baseline cognitive functioning                                        Exercises      General Comments        Pertinent Vitals/Pain Pain Assessment: No/denies pain Pain Intervention(s): Limited activity within patient's tolerance;Monitored during session    Home Living                       Prior Function            PT Goals (current goals can now be found in the care plan section) Acute Rehab PT Goals Patient Stated Goal: Go home PT Goal Formulation: With patient Time For Goal Achievement: 11/05/19 Potential to Achieve Goals: Good Progress towards PT goals: Progressing toward goals    Frequency    Min 3X/week      PT Plan Current plan remains appropriate    Co-evaluation              AM-PAC PT "6 Clicks" Mobility   Outcome Measure  Help needed turning from your back to your side while in a flat bed without using bedrails?: None Help needed moving from lying on your back to sitting on the side of a flat bed without using bedrails?: A Little Help needed moving to and from a bed to a chair (including a wheelchair)?: Total Help needed standing up from a chair using your arms (e.g., wheelchair or bedside chair)?: A Little Help needed to walk in hospital room?: Total Help needed climbing 3-5 steps with a railing? : Total 6 Click Score: 13    End of Session Equipment Utilized During Treatment: Gait belt Activity Tolerance: Patient tolerated treatment well Patient left: in chair;with chair alarm set;with call bell/phone within reach Nurse Communication: Mobility status PT Visit Diagnosis: Muscle weakness (generalized) (M62.81);Difficulty in walking, not elsewhere classified (R26.2);History of falling (Z91.81)     Time:  -     Charges:                       Elisha Ponder, SPT, ATC

## 2019-10-26 NOTE — Progress Notes (Signed)
Progress Note    Kelly [name]  ZOX:[id number] DOB: 27-May-1955  DOA: 10/21/2019 PCP: Arvilla Market, DO    Brief Narrative:    Medical records reviewed and are as summarized below:  Kelly [name] is an 64 y.o. female *with history of COPD, tobacco abuse, chronic diastolic CHF, CAD/PCI and stents, hypertension, dyslipidemia presented to the emergency room 8/20 with worsening weakness, nausea, dysphagia and odynophagia, poor oral intake for over 2 months, this has been slowly progressive.  She has lost about 25 pounds, denies any fevers or chills, continues to smoke a pack of cigarettes a day. -Work-up in the ED noted mild hypotension but improved with saline, severe hypokalemia CT chest concerning for interstitial lung disease and emphysema, CT abdomen suggestive of esophagitis and infectious/inflammatory colitis -[facility] following , EGD was unremarkable, biopsy positive for Candida -Barium esophagram/evaluation for dysmotility was limited  Assessment/Plan:   Principal Problem:   Esophagitis Active Problems:   Dysphagia   Unintentional weight loss   Colitis   Hypokalemia   Dysphagia/Nausea Severe protein calorie malnutrition/weight loss Adult failure to thrive -History of dysphagia and nausea progressively worsening for months, no overt findings concerning for malignancy based on CT chest/abdomen pelvis [facility] gastroenterology consulting, endoscopy 8/21 was unremarkable, follow-up biopsy, Ba esophagram w/out overt findings, limited evaluation of dysmotility due to patient's nausea and inability to stand -candida noted on path-starting Fluconazole -MBS: Dysphagia, pharyngoesophageal phase: dys 1 thin liquids -TSH and random cortisol within normal limits -started marinol, scheduled zofran -Dietitian consulted- calorie count- may need to consider G tube  Chronic diarrhea -CT abdomen with questionable mild colitis, clinically do not suspect active  colitis -Antibiotics discontinued, could be malabsorption related -C. 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 (Active)  10/22/19 0010  Location: Hip  Location Orientation: Left  Staging: Unstageable - Full thickness tissue loss in which the base of the injury is covered by slough (yellow, tan, gray, green or brown) and/or eschar (tan, brown or black) in the wound bed.  Wound Description (Comments):   Present on Admission: yes   Nutrition Status: Nutrition Problem: Inadequate oral intake Etiology: dysphagia (esophagitis) Signs/Symptoms: meal completion < 25% Interventions: Boost Breeze, Refer to RD note for recommendations    Family Communication/Anticipated D/C date and plan/Code Status   DVT prophylaxis: Lovenox ordered. Code Status: Full Code.   Disposition Plan: Status is: Inpatient  Remains inpatient appropriate because:Inpatient level of care appropriate due to severity of illness   Dispo: The patient is from: Home              Anticipated d/c is to: Home              Anticipated d/c date is: 3 days              Patient currently is not medically stable to d/c.         Medical Consultants:    GI  Subjective:   Trying to eat breakfast but laying down and to the right side  Objective:    Vitals:   10/25/19 1623 10/25/19 2038 10/26/19  0441 10/26/19 0955  BP: 138/61 (!) 109/45 (!) 142/47 (!) 128/55  Pulse: 78 66 70 73  Resp: 14 18 18 16   Temp: 99.2 F (37.3 C) 98.5 F (36.9 C) 98.6 F (37 C) 98.5 F (36.9 C)  TempSrc: Oral Oral Oral Oral  SpO2: 97% 98% 96% 99%  Weight:      Height:        Intake/Output Summary (Last 24 hours) at 10/26/2019 1201 Last data filed at 10/26/2019 0900 Gross per 24 hour  Intake 840 ml  Output 0 ml  Net 840 ml   Filed Weights   10/24/19 1000 10/24/19 1938  Weight: 48.4 kg 49.1 kg    Exam:  General: Appearance:    Cachectic female in no acute distress- older than stated age     Lungs:     Clear to auscultation bilaterally, respirations unlabored  Heart:    Normal heart rate. Normal rhythm. No murmurs, rubs, or gallops.   MS:   All extremities are intact.   Neurologic:   Awake, alert, oriented x 3. No apparent focal neurological           defect. Poor insight into disease process    Data Reviewed:   I have personally reviewed following labs and imaging studies:  Labs: Labs show the following:   Basic Metabolic Panel: Recent Labs  Lab 10/21/19 1355 10/21/19 1730 10/22/19 0041 10/22/19 0041 10/23/19 0815 10/23/19 0815 10/24/19 0322 10/24/19 0322 10/25/19 4098 10/26/19 0724  NA   < >  --  136  --  136  --  138  --  135 136  K   < >  --  3.7   < > 3.2*   < > 3.7   < > 3.3* 3.6  CL   < >  --  107  --  107  --  109  --  106 107  CO2   < >  --  25  --  17*  --  17*  --  22 23  GLUCOSE   < >  --  77  --  75  --  57*  --  131* 112*  BUN   < >  --  11  --  10  --  8  --  14 16  CREATININE   < >  --  1.13*  --  1.07*  --  1.09*  --  1.07* 0.93  CALCIUM   < >  --  7.4*  --  7.5*  --  7.8*  --  8.0* 7.6*  MG  --  1.6* 1.6*  --  2.0  --   --   --   --   --    < > = values in this interval not displayed.   GFR Estimated Creatinine Clearance: 46.1 mL/min (by C-G formula based on SCr of 0.93 mg/dL). Liver Function Tests: Recent Labs  Lab 10/21/19 1355 10/23/19 0815  10/24/19 0322 10/25/19 0632 10/26/19 0724  AST 10* 8* 8* 8* 7*  ALT 5 6 6 7 7   ALKPHOS 69 56 52 42 38  BILITOT 0.9 0.5 0.9 0.5 0.3  PROT 6.1* 5.1* 5.1* 4.7* 4.5*  ALBUMIN 2.5* 2.2* 2.2* 2.1* 1.9*   Recent Labs  Lab 10/21/19 1355  LIPASE 25   No results  for input(s): AMMONIA in the last 168 hours. Coagulation profile No results for input(s): INR, PROTIME in the last 168 hours.  CBC: Recent Labs  Lab 10/21/19 1355 10/23/19 0815 10/24/19 0322 10/25/19 0632  WBC 17.8* 10.5 10.1 10.2  HGB 14.1 11.3* 11.4* 10.6*  HCT 41.8 33.9* 33.6* 31.3*  MCV 101.7* 100.9* 102.4* 101.3*  PLT 171 129* 137* 128*   Cardiac Enzymes: No results for input(s): CKTOTAL, CKMB, CKMBINDEX, TROPONINI in the last 168 hours. BNP (last 3 results) No results for input(s): PROBNP in the last 8760 hours. CBG: No results for input(s): GLUCAP in the last 168 hours. D-Dimer: No results for input(s): DDIMER in the last 72 hours. Hgb A1c: No results for input(s): HGBA1C in the last 72 hours. Lipid Profile: No results for input(s): CHOL, HDL, LDLCALC, TRIG, CHOLHDL, LDLDIRECT in the last 72 hours. Thyroid function studies: Recent Labs    10/23/19 1544  TSH 0.616   Anemia work up: No results for input(s): VITAMINB12, FOLATE, FERRITIN, TIBC, IRON, RETICCTPCT in the last 72 hours. Sepsis Labs: Recent Labs  Lab 10/21/19 1355 10/22/19 0041 10/23/19 0815 10/24/19 0322 10/25/19 0632  WBC 17.8*  --  10.5 10.1 10.2  LATICACIDVEN  --  1.0  --   --   --     Microbiology Recent Results (from the past 240 hour(s))  SARS Coronavirus 2 by RT PCR (hospital order, performed in [facility] hospital lab) Nasopharyngeal Nasopharyngeal Swab     Status: None   Collection Time: 10/21/19  5:01 PM   Specimen: Nasopharyngeal Swab  Result Value Ref Range Status   SARS Coronavirus 2 NEGATIVE NEGATIVE Final    Comment: (NOTE) SARS-CoV-2 target nucleic acids are NOT DETECTED.  The SARS-CoV-2 RNA is generally  detectable in upper and lower respiratory specimens during the acute phase of infection. The lowest concentration of SARS-CoV-2 viral copies this assay can detect is 250 copies / mL. A negative result does not preclude SARS-CoV-2 infection and should not be used as the sole basis for treatment or other patient management decisions.  A negative result may occur with improper specimen collection / handling, submission of specimen other than nasopharyngeal swab, presence of viral mutation(s) within the areas targeted by this assay, and inadequate number of viral copies (<250 copies / mL). A negative result must be combined with clinical observations, patient history, and epidemiological information.  Fact Sheet for Patients:   BoilerBrush.com.cy  Fact Sheet for Healthcare Providers: https://pope.com/  This test is not yet approved or  cleared by the [place] FDA and has been authorized for detection and/or diagnosis of SARS-CoV-2 by FDA under an Emergency Use Authorization (EUA).  This EUA will remain in effect (meaning this test can be used) for the duration of the COVID-19 declaration under Section 564(b)(1) of the Act, 21 U.S.C. section 360bbb-3(b)(1), unless the authorization is terminated or revoked sooner.  Performed at [facility], [address], [place], [place] [address]   Culture, blood (routine x 2)     Status: None (Preliminary result)   Collection Time: 10/22/19 12:41 AM   Specimen: BLOOD  Result Value Ref Range Status   Specimen Description BLOOD LEFT ANTECUBITAL  Final   Special Requests   Final    BOTTLES DRAWN AEROBIC AND ANAEROBIC Blood Culture adequate volume   Culture   Final    NO GROWTH 4 DAYS Performed at [facility], [address], [place], [place] [address]    Report Status PENDING  Incomplete  Culture, blood (routine x 2)     Status: None (Preliminary result)   Collection Time:  10/22/19 12:47 AM   Specimen: BLOOD RIGHT HAND  Result Value Ref Range Status   Specimen Description BLOOD RIGHT HAND  Final   Special Requests   Final    BOTTLES DRAWN AEROBIC ONLY Blood Culture adequate volume   Culture   Final    NO GROWTH 4 DAYS Performed at [facility], [address], [place], [place] [address]    Report Status PENDING  Incomplete  Gastrointestinal Panel by PCR , Stool     Status: None   Collection Time: 10/22/19  4:45 PM   Specimen: STOOL  Result Value Ref Range Status   Campylobacter species NOT DETECTED NOT DETECTED Final   Plesimonas shigelloides NOT DETECTED NOT DETECTED Final   Salmonella species NOT DETECTED NOT DETECTED Final   Yersinia enterocolitica NOT DETECTED NOT DETECTED Final   Vibrio species NOT DETECTED NOT DETECTED Final   Vibrio cholerae NOT DETECTED NOT DETECTED Final   Enteroaggregative E coli (EAEC) NOT DETECTED NOT DETECTED Final   Enteropathogenic E coli (EPEC) NOT DETECTED NOT DETECTED Final   Enterotoxigenic E coli (ETEC) NOT DETECTED NOT DETECTED Final   Shiga like toxin producing E coli (STEC) NOT DETECTED NOT DETECTED Final   Shigella/Enteroinvasive E coli (EIEC) NOT DETECTED NOT DETECTED Final   Cryptosporidium NOT DETECTED NOT DETECTED Final   Cyclospora cayetanensis NOT DETECTED NOT DETECTED Final   Entamoeba histolytica NOT DETECTED NOT DETECTED Final   Giardia lamblia NOT DETECTED NOT DETECTED Final   Adenovirus F40/41 NOT DETECTED NOT DETECTED Final   Astrovirus NOT DETECTED NOT DETECTED Final   Norovirus GI/GII NOT DETECTED NOT DETECTED Final   Rotavirus A NOT DETECTED NOT DETECTED Final   Sapovirus (I, II, IV, and V) NOT DETECTED NOT DETECTED Final    Comment: Performed at [facility], [address] [place], [place] [address]  C Difficile Quick Screen w PCR reflex     Status: None   Collection Time: 10/22/19  4:45 PM   Specimen: STOOL  Result Value Ref Range Status   C Diff antigen NEGATIVE  NEGATIVE Final   C Diff toxin NEGATIVE NEGATIVE Final   C Diff interpretation No C. difficile detected.  Final    Comment: Performed at [facility], [address], [place], [place] [address]    Procedures and diagnostic studies:  DG Swallowing Func-Speech Pathology  Result Date: 10/25/2019 Objective Swallowing Evaluation: Type of Study: MBS-Modified Barium Swallow Study  Patient Details Name: Noma Quijas MRN: [id number] Date of Birth: 28-Dec-1955 Today's Date: 10/25/2019 Time: SLP Start Time (ACUTE ONLY): 1315 -SLP Stop Time (ACUTE ONLY): 1330 SLP Time Calculation (min) (ACUTE ONLY): 15 min Past Medical History: Past Medical History: Diagnosis Date . CAD (coronary artery disease)   5 stents in the past, 4 in [facility], 1 in [place] [facility]. Last cath 01/2017 . CHF (congestive heart failure) (HCC)  . 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 Hyperlipidemia LDL goal <70  . Hypertension  Past Surgical History: Past Surgical History: Procedure Laterality Date . BIOPSY  10/22/2019  Procedure: BIOPSY;  Surgeon: Bernette Redbird, MD;  Location: [facility] ENDOSCOPY;  Service: Endoscopy;; . CARDIAC CATHETERIZATION    stents x 5 . ESOPHAGOGASTRODUODENOSCOPY (EGD) WITH PROPOFOL N/A 10/22/2019  Procedure: ESOPHAGOGASTRODUODENOSCOPY (EGD) WITH PROPOFOL;  Surgeon: Bernette Redbird, MD;  Location: [facility] ENDOSCOPY;  Service: Endoscopy;  Laterality: N/A; HPI: Christiana Gurevich is a 64 y.o. female with medical history significant of CAD status post PCI, chronic diastolic CHF, hypertension, hyperlipidemia presenting with complaints of generalized weakness, nausea, dysphagia, and poor oral intake. Patient states she has had difficulty swallowing for at least 2 months now and she has been losing a lot of weight. Both food and liquid gets stuck when she tries to swallow and it is painful. States she has lost at least 20 to 25 pounds. She feels nauseous but is not vomiting. She is  hardly able to eat anything and when she does, she experiences diarrhea. Denies abdominal pain. Reports having a chronic cough due to history of smoking. She smokes 1 pack of cigarettes daily. Denies fevers, shortness of breath, or chest pain. She has not been vaccinated against Covid.  CT of the abdomen and pelvis with findings suggestive of esophagitis and infection or inflammatory colitis.  Subjective: alert, upright in chair for procedure Assessment / Plan / Recommendation CHL IP CLINICAL IMPRESSIONS 10/25/2019 Clinical Impression Pharyngoesophageal dysphagia evidenced with deficits in both swallow safety and efficiency. Cervical hardware noted C4/C5 from past cervical surgery with posterior pharyngeal wall anatomical position more anteriorly at level of hardware. This suspected to contribute to incomplete epiglottic deflection which negatively impacting laryngeal vestibule closure. Deficits also noted in UES distention, base of tongue retraction, hyoid excursion, and diminished sensation. Pt with frequent during the swallow laryngeal penetration and frequent post swallow aspiration of liquid viscosities (thin, nectar thick, and honey thick liquids); inconsisently sensed. Vallecular and pyriform sinus residuals noted that spilled post swallow (worsened with thicker consistencies). Suspect dysphagia has been ongoing with chronic episodic aspiration. Chin tuck ineffective at improving airway protection. Swallow safety was maximized by smaller sips/bites, multiple swallows, and use of throat clear and or cough. Pt with barium tablet retention in distal esophagus that eventually passed into the stomach, barium retention exhibited as consistent with barium swallow evaluation.  Recommend continue dysphagia 1 (puree) thin liquids with medicines in puree, crush larger.   SLP Visit Diagnosis Dysphagia, pharyngoesophageal phase (R13.14) Attention and concentration deficit following -- Frontal lobe and executive function  deficit following -- Impact on safety and function Moderate aspiration risk   CHL IP TREATMENT RECOMMENDATION 10/25/2019 Treatment Recommendations Therapy as outlined in treatment plan below   Prognosis 10/25/2019 Prognosis for Safe Diet Advancement Fair Barriers to Reach Goals -- Barriers/Prognosis Comment -- CHL IP DIET RECOMMENDATION 10/25/2019 SLP Diet Recommendations Dysphagia 1 (Puree) solids;Thin liquid Liquid Administration via Cup Medication Administration Whole meds with puree Compensations Slow rate;Small sips/bites;Follow solids with liquid;Multiple dry swallows after each bite/sip;Clear throat intermittently Postural Changes Seated upright at 90 degrees;Remain semi-upright after after feeds/meals (Comment)   CHL IP OTHER RECOMMENDATIONS 10/25/2019 Recommended Consults -- Oral Care Recommendations Oral care BID Other Recommendations --   CHL IP FOLLOW UP RECOMMENDATIONS 10/23/2019 Follow up Recommendations Other (comment)   CHL IP FREQUENCY AND DURATION 10/25/2019 Speech Therapy Frequency (ACUTE ONLY) min 1 x/week Treatment Duration 1 week      CHL IP ORAL PHASE 10/25/2019 Oral Phase WFL Oral - Pudding Teaspoon -- Oral - Pudding Cup -- Oral - Honey Teaspoon -- Oral - Honey Cup -- Oral - Nectar Teaspoon -- Oral - Nectar Cup -- Oral - Nectar Straw -- Oral - Thin Teaspoon -- Oral - Thin Cup -- Oral - Thin Straw -- Oral - Puree -- Oral - Mech Soft -- Oral - Regular -- Oral - Multi-Consistency -- Oral - Pill --  Oral Phase - Comment --  CHL IP PHARYNGEAL PHASE 10/25/2019 Pharyngeal Phase Impaired Pharyngeal- Pudding Teaspoon -- Pharyngeal -- Pharyngeal- Pudding Cup -- Pharyngeal -- Pharyngeal- Honey Teaspoon -- Pharyngeal -- Pharyngeal- Honey Cup Penetration/Aspiration during swallow;Penetration/Apiration after swallow;Trace aspiration;Pharyngeal residue - valleculae;Pharyngeal residue - pyriform;Reduced laryngeal elevation;Reduced pharyngeal peristalsis;Reduced epiglottic inversion;Reduced tongue base retraction  Pharyngeal Material enters airway, remains ABOVE vocal cords and not ejected out;Material enters airway, passes BELOW cords without attempt by patient to eject out (silent aspiration) Pharyngeal- Nectar Teaspoon -- Pharyngeal -- Pharyngeal- Nectar Cup Reduced epiglottic inversion;Reduced laryngeal elevation;Reduced tongue base retraction;Pharyngeal residue - valleculae;Pharyngeal residue - pyriform;Penetration/Aspiration during swallow;Penetration/Apiration after swallow;Reduced pharyngeal peristalsis;Reduced airway/laryngeal closure Pharyngeal Material enters airway, remains ABOVE vocal cords and not ejected out Pharyngeal- Nectar Straw -- Pharyngeal -- Pharyngeal- Thin Teaspoon -- Pharyngeal -- Pharyngeal- Thin Cup Penetration/Aspiration during swallow;Penetration/Apiration after swallow;Reduced epiglottic inversion;Reduced tongue base retraction;Reduced laryngeal elevation;Pharyngeal residue - valleculae;Pharyngeal residue - pyriform;Reduced pharyngeal peristalsis Pharyngeal Material enters airway, passes BELOW cords without attempt by patient to eject out (silent aspiration);Material enters airway, remains ABOVE vocal cords and not ejected out;Material enters airway, CONTACTS cords and not ejected out Pharyngeal- Thin Straw -- Pharyngeal -- Pharyngeal- Puree Reduced epiglottic inversion;Reduced laryngeal elevation;Reduced tongue base retraction;Reduced airway/laryngeal closure;Penetration/Aspiration during swallow;Penetration/Apiration after swallow;Pharyngeal residue - valleculae;Pharyngeal residue - pyriform Pharyngeal Material enters airway, remains ABOVE vocal cords and not ejected out Pharyngeal- Mechanical Soft Delayed swallow initiation-vallecula;Reduced epiglottic inversion;Reduced tongue base retraction;Reduced laryngeal elevation;Pharyngeal residue - valleculae;Pharyngeal residue - pyriform Pharyngeal -- Pharyngeal- Regular -- Pharyngeal -- Pharyngeal- Multi-consistency -- Pharyngeal -- Pharyngeal-  Pill Reduced tongue base retraction;Reduced laryngeal elevation;Pharyngeal residue - pyriform;Pharyngeal residue - valleculae;Penetration/Aspiration during swallow;Penetration/Apiration after swallow;Reduced epiglottic inversion Pharyngeal Material enters airway, passes BELOW cords without attempt by patient to eject out (silent aspiration) Pharyngeal Comment --  CHL IP CERVICAL ESOPHAGEAL PHASE 10/25/2019 Cervical Esophageal Phase Impaired Pudding Teaspoon -- Pudding Cup -- Honey Teaspoon -- Honey Cup Reduced cricopharyngeal relaxation Nectar Teaspoon -- Nectar Cup Reduced cricopharyngeal relaxation Nectar Straw -- Thin Teaspoon -- Thin Cup Reduced cricopharyngeal relaxation Thin Straw -- Puree Reduced cricopharyngeal relaxation Mechanical Soft Reduced cricopharyngeal relaxation Regular -- Multi-consistency -- Pill Reduced cricopharyngeal relaxation Cervical Esophageal Comment -- Chelsea E Hartness MA, CCC-SLP 10/25/2019, 3:46 PM               Medications:   . collagenase   Topical Daily  . dronabinol  2.5 mg Oral BID AC  . enoxaparin (LOVENOX) injection  40 mg Subcutaneous Q24H  . feeding supplement  1 Container Oral TID BM  . [START ON 10/27/2019] levothyroxine  25 mcg Oral Q0600  . nicotine  21 mg Transdermal Daily  . ondansetron (ZOFRAN) IV  4 mg Intravenous TID AC  . pantoprazole  40 mg Oral Q1200   Continuous Infusions: . fluconazole (DIFLUCAN) IV 100 mg (10/26/19 1015)     LOS: 4 days   Joseph Art  [facility]   How to contact the [facility] Attending or Consulting provider 7A - 7P or covering provider during after hours 7P -7A, for this patient?  1. Check the care team in [facility] and look for a) attending/consulting [facility] provider listed and b) the [facility] team listed 2. Log into [email or web address] and use [place]'s universal password to access. If you do not have the password, please contact the hospital operator. 3. Locate the [facility] provider you are looking for under [facility] and  page to a number that you can be directly reached. 4. If you still have difficulty  reaching the provider, please page the [facility] (Director on Call) for the Hospitalists listed on amion for assistance.  10/26/2019, 12:01 PM

## 2019-10-26 NOTE — Progress Notes (Signed)
   10/25/19 2200  Gastrointestinal  Abdomen Inspection Soft  Bowel Sounds Assessment Active  Tenderness Nontender  Stool Characteristics  Bowel Incontinence Yes  Stool Type Type 6 (Mushy consistency with ragged edges)  Stool Descriptors Yellow  Stool Amount Medium  Stool Source Rectum  Urine Characteristics  Hygiene Peri care

## 2019-10-26 NOTE — Progress Notes (Signed)
Esophageal biopsy results were reviewed with the patient.  (See yesterday's note).    Speech pathology note reviewed.  Dysphagia 1 diet recommended.    I have concerns whether this patient, who has a marginal social situation, is essentially bed confined, and has already lost significant weight (by her report), will be able to nourish herself optimally on a dysphagia 1 diet.  I therefore discussed with her at considerable length the concept of having a gastrostomy tube placed prior to discharge, so that she could receive supplemental nutrition by that method.  In this way, she could eat for enjoyment, rather than to survive.  Moreover, her nutritional status would probably be improved, which would give her more strength and more resistance to infection.  The patient was gratified to hear that a gastrostomy tube would not interfere with her intention of returning home, that is, it would not require her being transferred to a skilled nursing facility.  She seems quite open to the idea of having a tube placed.  I have asked for a dietitian assessment of nutritional status, and their expert opinion as to whether or not this patient would benefit from a gastrostomy tube, and in the meantime, have asked interventional radiology to see the patient with the thought of tentatively placing a tube prior to the weekend if the dietitian feels it is clinically appropriate.  I have also spoken with the patient's hospitalist physician, who is in agreement with the above plan.  Please call me if it would be helpful to discuss this patient's case.  Kelly [name], M.D. Pager [phone] If no answer or after 5 PM call [phone]

## 2019-10-26 NOTE — Progress Notes (Signed)
   10/26/19 1200  Unmeasured Output  Stool Occurrence 1  Stool Characteristics  Bowel Incontinence Yes  Stool Type Type 6 (Mushy consistency with ragged edges)  Has the patient had three Type 7 stools in the last 24 hours? Yes (Noninfectious cause of loose stool is known)  Stool Descriptors Yellow  Stool Amount Large  Stool Source Rectum

## 2019-10-27 DIAGNOSIS — E440 Moderate protein-calorie malnutrition: Secondary | ICD-10-CM | POA: Insufficient documentation

## 2019-10-27 LAB — MAGNESIUM: Magnesium: 1.8 mg/dL (ref 1.7–2.4)

## 2019-10-27 LAB — BASIC METABOLIC PANEL
Anion gap: 6 (ref 5–15)
BUN: 16 mg/dL (ref 8–23)
CO2: 22 mmol/L (ref 22–32)
Calcium: 7.4 mg/dL — ABNORMAL LOW (ref 8.9–10.3)
Chloride: 107 mmol/L (ref 98–111)
Creatinine, Ser: 0.95 mg/dL (ref 0.44–1.00)
GFR calc Af Amer: >60 mL/min (ref >60)
GFR calc non Af Amer: >60 mL/min (ref >60)
Glucose, Bld: 154 mg/dL — ABNORMAL HIGH (ref 70–99)
Potassium: 3.3 mmol/L — ABNORMAL LOW (ref 3.5–5.1)
Sodium: 135 mmol/L (ref 135–145)

## 2019-10-27 LAB — CBC
HCT: 28.7 % — ABNORMAL LOW (ref 36.0–46.0)
Hemoglobin: 9.5 g/dL — ABNORMAL LOW (ref 12.0–15.0)
MCH: 33.5 pg (ref 26.0–34.0)
MCHC: 33.1 g/dL (ref 30.0–36.0)
MCV: 101.1 fL — ABNORMAL HIGH (ref 80.0–100.0)
Platelets: 101 10*3/uL — ABNORMAL LOW (ref 150–400)
RBC: 2.84 MIL/uL — ABNORMAL LOW (ref 3.87–5.11)
RDW: 13.3 % (ref 11.5–15.5)
WBC: 8.9 10*3/uL (ref 4.0–10.5)
nRBC: 0 % (ref 0.0–0.2)

## 2019-10-27 LAB — CULTURE, BLOOD (ROUTINE X 2)
Culture: NO GROWTH
Culture: NO GROWTH
Special Requests: ADEQUATE
Special Requests: ADEQUATE

## 2019-10-27 LAB — GLUCOSE, CAPILLARY
Glucose-Capillary: 311 mg/dL — ABNORMAL HIGH (ref 70–99)
Glucose-Capillary: 194 mg/dL — ABNORMAL HIGH (ref 70–99)

## 2019-10-27 LAB — PHOSPHORUS: Phosphorus: 2.6 mg/dL (ref 2.5–4.6)

## 2019-10-27 MED ORDER — CEFAZOLIN SODIUM-DEXTROSE 2-4 GM/100ML-% IV SOLN: 2.0000 g | INTRAVENOUS | Status: AC

## 2019-10-27 MED ORDER — INSULIN ASPART 100 UNIT/ML ~~LOC~~ SOLN
0.0000 [IU] | Freq: Three times a day (TID) | SUBCUTANEOUS | Status: DC
  Administered 2019-10-27: 4 [IU] via SUBCUTANEOUS
  Administered 2019-10-28: 1 [IU] via SUBCUTANEOUS
  Administered 2019-10-29: 3 [IU] via SUBCUTANEOUS
  Administered 2019-10-29: 1 [IU] via SUBCUTANEOUS

## 2019-10-27 MED ORDER — ENOXAPARIN SODIUM 40 MG/0.4ML ~~LOC~~ SOLN
40.0000 mg | SUBCUTANEOUS | Status: DC
  Administered 2019-10-29 – 2019-10-31 (×3): 40 mg via SUBCUTANEOUS
  Filled 2019-10-27 (×4): qty 0.4

## 2019-10-27 MED ORDER — INSULIN ASPART 100 UNIT/ML ~~LOC~~ SOLN: 0.0000 [IU] | Freq: Every day | SUBCUTANEOUS | Status: DC

## 2019-10-27 MED ORDER — POTASSIUM CHLORIDE 10 MEQ/100ML IV SOLN
10.0000 meq | INTRAVENOUS | Status: AC
  Administered 2019-10-27 (×4): 10 meq via INTRAVENOUS
  Filled 2019-10-27 (×3): qty 100

## 2019-10-27 MED ORDER — MAGNESIUM SULFATE 2 GM/50ML IV SOLN
2.0000 g | Freq: Once | INTRAVENOUS | Status: AC
  Administered 2019-10-27: 2 g via INTRAVENOUS
  Filled 2019-10-27: qty 50

## 2019-10-27 NOTE — Progress Notes (Signed)
Progress Note    Kelly [name]  KGU:[id number] DOB: 06/20/1955  DOA: 10/21/2019 PCP: Arvilla Market, DO    Brief Narrative:    Medical records reviewed and are as summarized below:  Kelly [name] is an 64 y.o. female *with history of COPD, tobacco abuse, chronic diastolic CHF, CAD/PCI and stents, hypertension, dyslipidemia presented to the emergency room 8/20 with worsening weakness, nausea, dysphagia and odynophagia, poor oral intake for over 2 months, this has been slowly progressive.  She has lost about 25 pounds, denies any fevers or chills, continues to smoke a pack of cigarettes a day. -Work-up in the ED noted mild hypotension but improved with saline, severe hypokalemia CT chest concerning for interstitial lung disease and emphysema, CT abdomen suggestive of esophagitis and infectious/inflammatory colitis -[facility] following , EGD was unremarkable, biopsy positive for Candida -Barium esophagram/evaluation for dysmotility was limited  Assessment/Plan:   Principal Problem:   Esophagitis Active Problems:   Dysphagia   Unintentional weight loss   Colitis   Hypokalemia   Dysphagia/Nausea Severe protein calorie malnutrition/weight loss Adult failure to thrive -History of dysphagia and nausea progressively worsening for months, no overt findings concerning for malignancy based on CT chest/abdomen pelvis [facility] gastroenterology consulting, endoscopy 8/21 was unremarkable, follow-up biopsy, Ba esophagram w/out overt findings, limited evaluation of dysmotility due to patient's nausea and inability to stand -candida noted on path-starting Fluconazole -MBS: Dysphagia, pharyngoesophageal phase: dys 1 thin liquids -TSH and random cortisol within normal limits -started marinol, scheduled zofran -Dietitian consulted- calorie count- may need to consider G tube  Chronic diarrhea -CT abdomen with questionable mild colitis, clinically do not suspect active  colitis -Antibiotics discontinued, could be malabsorption related -C. 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 (Active)  10/22/19 0010  Location: Hip  Location Orientation: Left  Staging: Unstageable - Full thickness tissue loss in which the base of the injury is covered by slough (yellow, tan, gray, green or brown) and/or eschar (tan, brown or black) in the wound bed.  Wound Description (Comments):   Present on Admission: yes   Nutrition Status: Nutrition Problem: Inadequate oral intake Etiology: dysphagia (esophagitis) Signs/Symptoms: meal completion < 25% Interventions: Boost Breeze, Refer to RD note for recommendations    Family Communication/Anticipated D/C date and plan/Code Status   DVT prophylaxis: Lovenox ordered. Code Status: Full Code.  Called son- left message Disposition Plan: Status is: Inpatient  Remains inpatient appropriate because:Inpatient level of care appropriate due to severity of illness   Dispo: The patient is from: Home              Anticipated d/c is to: Home              Anticipated d/c date is: 3 days              Patient currently is not medically stable to d/c.         Medical Consultants:  GI  Subjective:   Like the orange boost  Objective:    Vitals:   10/26/19 1730 10/26/19 2116 10/27/19 0504 10/27/19 0927   BP: (!) 124/59 135/62 (!) 116/36 (!) 144/48  Pulse: 72 84 (!) 55 68  Resp: 17 18 18 18   Temp: 98.2 F (36.8 C) 99.1 F (37.3 C) 98 F (36.7 C) 98.5 F (36.9 C)  TempSrc: Oral Oral  Oral  SpO2: 98% 94% 97% 97%  Weight:      Height:        Intake/Output Summary (Last 24 hours) at 10/27/2019 1411 Last data filed at 10/27/2019 0900 Gross per 24 hour  Intake 707 ml  Output 0 ml  Net 707 ml   Filed Weights   10/24/19 1000 10/24/19 1938  Weight: 48.4 kg 49.1 kg    Exam:  General: Appearance:    Cachectic female in no acute distress     Lungs:      respirations unlabored  Heart:    Normal heart rate. Normal rhythm. No murmurs, rubs, or gallops.   MS:   All extremities are intact.   Neurologic:   Awake, alert, oriented with No apparent focal neurological           defect.      Data Reviewed:   I have personally reviewed following labs and imaging studies:  Labs: Labs show the following:   Basic Metabolic Panel: Recent Labs  Lab 10/21/19 1355 10/21/19 1730 10/22/19 0041 10/22/19 0041 10/23/19 0815 10/23/19 0815 10/24/19 10/26/19 10/24/19 10/26/19 10/25/19 10/27/19 10/25/19 10/27/19 10/26/19 0724 10/27/19 0150  NA   < >  --  136   < > 136  --  138  --  135  --  136 135  K   < >  --  3.7   < > 3.2*   < > 3.7   < > 3.3*   < > 3.6 3.3*  CL   < >  --  107   < > 107  --  109  --  106  --  107 107  CO2   < >  --  25   < > 17*  --  17*  --  22  --  23 22  GLUCOSE   < >  --  77   < > 75  --  57*  --  131*  --  112* 154*  BUN   < >  --  11   < > 10  --  8  --  14  --  16 16  CREATININE   < >  --  1.13*   < > 1.07*  --  1.09*  --  1.07*  --  0.93 0.95  CALCIUM   < >  --  7.4*   < > 7.5*  --  7.8*  --  8.0*  --  7.6* 7.4*  MG  --  1.6* 1.6*  --  2.0  --   --   --   --   --   --  1.8  PHOS  --   --   --   --   --   --   --   --   --   --   --  2.6   < > = values in this interval not displayed.   GFR Estimated Creatinine Clearance: 45.2 mL/min (by C-G formula based on SCr of 0.95  mg/dL). Liver Function Tests: Recent Labs  Lab 10/21/19 1355 10/23/19 0815 10/24/19 0322 10/25/19 0632 10/26/19 0724  AST 10* 8* 8* 8* 7*  ALT ALKPHOS 69 56 52 42 38  BILITOT 0.9 0.5 0.9 0.5 0.3  PROT 6.1* 5.1* 5.1* 4.7* 4.5*  ALBUMIN 2.5* 2.2* 2.2* 2.1* 1.9*   Recent Labs  Lab 10/21/19 1355  LIPASE 25   No results for input(s): AMMONIA in the last 168 hours. Coagulation profile No results for input(s): INR, PROTIME in the last 168 hours.  CBC: Recent Labs  Lab 10/23/19 0815 10/24/19 0322 10/25/19 0632 10/26/19 0724 10/27/19 0150  WBC 10.5 10.1 10.2 8.8 8.9  HGB 11.3* 11.4* 10.6* 10.2* 9.5*  HCT 33.9* 33.6* 31.3* 30.1* 28.7*  MCV 100.9* 102.4* 101.3* 101.7* 101.1*  PLT 129* 137* 128* 104* 101*   Cardiac Enzymes: No results for input(s): CKTOTAL, CKMB, CKMBINDEX, TROPONINI in the last 168 hours. BNP (last 3 results) No results for input(s): PROBNP in the last 8760 hours. CBG: Recent Labs  Lab 10/26/19 2040  GLUCAP 291*   D-Dimer: No results for input(s): DDIMER in the last 72 hours. Hgb A1c: No results for input(s): HGBA1C in the last 72 hours. Lipid Profile: No results for input(s): CHOL, HDL, LDLCALC, TRIG, CHOLHDL, LDLDIRECT in the last 72 hours. Thyroid function studies: No results for input(s): TSH, T4TOTAL, T3FREE, THYROIDAB in the last 72 hours.  Invalid input(s): FREET3 Anemia work up: No results for input(s): VITAMINB12, FOLATE, FERRITIN, TIBC, IRON, RETICCTPCT in the last 72 hours. Sepsis Labs: Recent Labs  Lab 10/22/19 0041 10/23/19 0815 10/24/19 0322 10/25/19 4098 10/26/19 0724 10/27/19 0150  WBC  --    < > 10.1 10.2 8.8 8.9  LATICACIDVEN 1.0  --   --   --   --   --    < > = values in this interval not displayed.    Microbiology Recent Results (from the past 240 hour(s))  SARS Coronavirus 2 by RT PCR (hospital order, performed in [facility] hospital lab) Nasopharyngeal Nasopharyngeal Swab     Status: None    Collection Time: 10/21/19  5:01 PM   Specimen: Nasopharyngeal Swab  Result Value Ref Range Status   SARS Coronavirus 2 NEGATIVE NEGATIVE Final    Comment: (NOTE) SARS-CoV-2 target nucleic acids are NOT DETECTED.  The SARS-CoV-2 RNA is generally detectable in upper and lower respiratory specimens during the acute phase of infection. The lowest concentration of SARS-CoV-2 viral copies this assay can detect is 250 copies / mL. A negative result does not preclude SARS-CoV-2 infection and should not be used as the sole basis for treatment or other patient management decisions.  A negative result may occur with improper specimen collection / handling, submission of specimen other than nasopharyngeal swab, presence of viral mutation(s) within the areas targeted by this assay, and inadequate number of viral copies (<250 copies / mL). A negative result must be combined with clinical observations, patient history, and epidemiological information.  Fact Sheet for Patients:   BoilerBrush.com.cy  Fact Sheet for Healthcare Providers: https://pope.com/  This test is not yet approved or  cleared by the [place] FDA and has been authorized for detection and/or diagnosis of SARS-CoV-2 by FDA under an Emergency Use Authorization (EUA).  This EUA will remain in effect (meaning this test can be used) for the duration of the COVID-19 declaration under Section 564(b)(1) of the Act, 21 U.S.C. section 360bbb-3(b)(1), unless the authorization is terminated or revoked sooner.  Performed at [facility]  Hospital Lab, [address], [place], [place] [address]   Culture, blood (routine x 2)     Status: None   Collection Time: 10/22/19 12:41 AM   Specimen: BLOOD  Result Value Ref Range Status   Specimen Description BLOOD LEFT ANTECUBITAL  Final   Special Requests   Final    BOTTLES DRAWN AEROBIC AND ANAEROBIC Blood Culture adequate volume   Culture   Final     NO GROWTH 5 DAYS Performed at [facility], [address] [place], [place] [address]    Report Status 10/27/2019 FINAL  Final  Culture, blood (routine x 2)     Status: None   Collection Time: 10/22/19 12:47 AM   Specimen: BLOOD RIGHT HAND  Result Value Ref Range Status   Specimen Description BLOOD RIGHT HAND  Final   Special Requests   Final    BOTTLES DRAWN AEROBIC ONLY Blood Culture adequate volume   Culture   Final    NO GROWTH 5 DAYS Performed at [facility], [address], [place], [place] [address]    Report Status 10/27/2019 FINAL  Final  Gastrointestinal Panel by PCR , Stool     Status: None   Collection Time: 10/22/19  4:45 PM   Specimen: STOOL  Result Value Ref Range Status   Campylobacter species NOT DETECTED NOT DETECTED Final   Plesimonas shigelloides NOT DETECTED NOT DETECTED Final   Salmonella species NOT DETECTED NOT DETECTED Final   Yersinia enterocolitica NOT DETECTED NOT DETECTED Final   Vibrio species NOT DETECTED NOT DETECTED Final   Vibrio cholerae NOT DETECTED NOT DETECTED Final   Enteroaggregative E coli (EAEC) NOT DETECTED NOT DETECTED Final   Enteropathogenic E coli (EPEC) NOT DETECTED NOT DETECTED Final   Enterotoxigenic E coli (ETEC) NOT DETECTED NOT DETECTED Final   Shiga like toxin producing E coli (STEC) NOT DETECTED NOT DETECTED Final   Shigella/Enteroinvasive E coli (EIEC) NOT DETECTED NOT DETECTED Final   Cryptosporidium NOT DETECTED NOT DETECTED Final   Cyclospora cayetanensis NOT DETECTED NOT DETECTED Final   Entamoeba histolytica NOT DETECTED NOT DETECTED Final   Giardia lamblia NOT DETECTED NOT DETECTED Final   Adenovirus F40/41 NOT DETECTED NOT DETECTED Final   Astrovirus NOT DETECTED NOT DETECTED Final   Norovirus GI/GII NOT DETECTED NOT DETECTED Final   Rotavirus A NOT DETECTED NOT DETECTED Final   Sapovirus (I, II, IV, and V) NOT DETECTED NOT DETECTED Final    Comment: Performed at [facility], [address] [place], [place] [address]  C Difficile Quick Screen w PCR reflex     Status: None   Collection Time: 10/22/19  4:45 PM   Specimen: STOOL  Result Value Ref Range Status   C Diff antigen NEGATIVE NEGATIVE Final   C Diff toxin NEGATIVE NEGATIVE Final   C Diff interpretation No C. difficile detected.  Final    Comment: Performed at [facility] Lab, [address], [place], [place] [address]    Procedures and diagnostic studies:  No results found.  Medications:   . citalopram  20 mg Oral QHS  . collagenase   Topical Daily  . diclofenac Sodium  4 g Topical QID  . dronabinol  2.5 mg Oral BID AC  . enoxaparin (LOVENOX) injection  40 mg Subcutaneous Q24H  . feeding supplement  1 Container Oral TID BM  . levothyroxine  25 mcg Oral Q0600  . nicotine  21 mg Transdermal Daily  .  ondansetron (ZOFRAN) IV  4 mg Intravenous TID AC  . pantoprazole  40 mg Oral Q1200  . pregabalin  75 mg Oral BID  . topiramate  25 mg Oral BID   Continuous Infusions: . fluconazole (DIFLUCAN) IV 100 mg (10/27/19 1219)     LOS: 5 days   Joseph Art  [facility]   How to contact the [facility] Attending or Consulting provider 7A - 7P or covering provider during after hours 7P -7A, for this patient?  1. Check the care team in [facility] and look for a) attending/consulting [facility] provider listed and b) the [facility] team listed 2. Log into [email or web address] and use [place]'s universal password to access. If you do not have the password, please contact the hospital operator. 3. Locate the [facility] provider you are looking for under [facility] and page to a number that you can be directly reached. 4. If you still have difficulty reaching the provider, please page the [facility] (Director on Call) for the Hospitalists listed on amion for assistance.  10/27/2019, 2:11 PM

## 2019-10-27 NOTE — Progress Notes (Addendum)
Initial Nutrition Assessment  DOCUMENTATION CODES:   Non-severe (moderate) malnutrition in context of chronic illness  INTERVENTION:  Continue Boost Breeze po TID, each supplement provides 250 kcal and 9 grams of protein  Once gastrostomy tube is placed and ready for use: -Begin Osmolite 1.5 @ 20 ml/hr, advance by 10 ml every 8 hours to goal rate of 50 ml/hr -52ml Prosource TF daily  At goal, tube feeding regimen will provide 1840 kcals, 86 grams of protein, 931ml free water  NUTRITION DIAGNOSIS:   Moderate Malnutrition related to chronic illness (dysphagia, esophagitis) as evidenced by mild fat depletion, mild muscle depletion, energy intake < or equal to 75% for > or equal to 1 month.  New dx  GOAL:   Patient will meet greater than or equal to 90% of their needs  Not met  MONITOR:   TF tolerance, Weight trends, Labs, I & O's  REASON FOR ASSESSMENT:   Malnutrition Screening Tool    ASSESSMENT:   Chronically ill extremely cachectic 64 year old female with history of COPD, tobacco abuse, chronic diastolic CHF, CAD/PCI and stents, hypertension, dyslipidemia presented to the emergency room 8/20 with worsening weakness, nausea, dysphagia and odynophagia, poor oral intake for over 2 months, this has been slowly progressive.  She has lost about 25 pounds, denies any fevers or chills, continues to smoke a pack of cigarettes a day.-Work-up in the ED noted mild hypotension but improved with saline, severe hypokalemia CT chest concerning for interstitial lung disease and emphysema, CT abdomen suggestive of esophagitis and infectious/inflammatory colitis  Pt's EGD was unremarkable per MD, but biopsy was positive for Candida.   RN in room at time of visit.   Pt difficult to obtain clear history from at this time. Pt states she eats 1-2 meals per day typically. These meals often consist of microwaved meals and sandwiches. Pt has someone who helps with shopping and food preparation;  however, unsure of the extent of help pt receives.   RD consulted to determine if pt would benefit from placement of gastrostomy tube. Of note, pt is essentially bed bound with a marginal social situation and has already been losing weight. SLP recommended a dysphagia 1 diet and is concerned pt will not be able to meet nutrition needs via PO intake alone. RD in agreement that pt is unlikely to be able to meet her needs via oral intake and will likely benefit greatly from a gastrostomy tube. Discussed pt with GI in depth. Plan to proceed with gastrostomy tube placement. Pt will eat for comfort and will have all of her nutritional needs met via tube feeding. RD will provide TF recommendations above.  Labs: K+ 3.3 (L) Medications: Marinol, Boost Breeze TID, Zofran, Protonix, Mg sulfate 2g x1, KCl 10mEq every 1 hour x4  NUTRITION - FOCUSED PHYSICAL EXAM:    Most Recent Value  Orbital Region Mild depletion  Upper Arm Region Mild depletion  Thoracic and Lumbar Region Mild depletion  Buccal Region Mild depletion  Temple Region Mild depletion  Clavicle Bone Region Mild depletion  Clavicle and Acromion Bone Region Mild depletion  Scapular Bone Region Mild depletion  Dorsal Hand Mild depletion  Patellar Region Severe depletion  Anterior Thigh Region Severe depletion  Posterior Calf Region Severe depletion  Edema (RD Assessment) None  Hair Reviewed  Eyes Reviewed  Mouth Reviewed  Skin Reviewed  Nails Reviewed       Diet Order:   Diet Order  DIET - DYS 1 Room service appropriate? Yes; Fluid consistency: Thin  Diet effective now                 EDUCATION NEEDS:   Education needs have been addressed  Skin:  Skin Assessment: Skin Integrity Issues: Skin Integrity Issues:: Unstageable Unstageable: L hip  Last BM:  8/25 type 7  Height:   Ht Readings from Last 1 Encounters:  10/22/19 $RemoveB'5\' 5"'[id number]$  (1.651 m)    Weight:   Wt Readings from Last 1 Encounters:  10/24/19 49.1 kg     BMI:  Body mass index is 18.01 kg/m.  Estimated Nutritional Needs:   Kcal:  1700-1900  Protein:  80-90g  Fluid:  1.7L/day    Larkin Ina, MS, RD, LDN RD pager number and weekend/on-call pager number located in [place].

## 2019-10-27 NOTE — Progress Notes (Signed)
Problems:  1.  Oropharyngeal and esophageal dysphagia 2.  Malnutrition 3.  Diarrhea, questionable colitis on CT  Diarrhea seems to be under reasonable control, with just 1 large watery bowel movement recorded over the past 48 hours, plus an additional 1 that the patient just had this morning.  The patient seems to be reasonably satisfied with her bowel function on the current regimen.  Preliminary dietitian note reviewed; it appears they do feel patient is appropriate for gastrostomy tube feeding.    Awaiting input from interventional radiology concerning possible gastrostomy tube placement.  Florencia Reasons, M.D. Pager [phone] If no answer or after 5 PM call [phone]

## 2019-10-27 NOTE — Progress Notes (Signed)
10/26/19 2120 Patient had 1 large watery bowel movement. Patient was cleaned and repositioned. Will continue to monitor.

## 2019-10-28 ENCOUNTER — Inpatient Hospital Stay (HOSPITAL_COMMUNITY): Payer: Medicare HMO

## 2019-10-28 DIAGNOSIS — E440 Moderate protein-calorie malnutrition: Secondary | ICD-10-CM

## 2019-10-28 DIAGNOSIS — R1310 Dysphagia, unspecified: Secondary | ICD-10-CM

## 2019-10-28 HISTORY — PX: IR GASTROSTOMY TUBE MOD SED: IMG625

## 2019-10-28 LAB — HEMOGLOBIN A1C
Hgb A1c MFr Bld: 5.3 % (ref 4.8–5.6)
Mean Plasma Glucose: 105.41 mg/dL

## 2019-10-28 LAB — BASIC METABOLIC PANEL
Anion gap: 4 — ABNORMAL LOW (ref 5–15)
BUN: 18 mg/dL (ref 8–23)
CO2: 22 mmol/L (ref 22–32)
Calcium: 7.6 mg/dL — ABNORMAL LOW (ref 8.9–10.3)
Chloride: 107 mmol/L (ref 98–111)
Creatinine, Ser: 1.01 mg/dL — ABNORMAL HIGH (ref 0.44–1.00)
GFR calc Af Amer: >60 mL/min (ref >60)
GFR calc non Af Amer: 58 mL/min — ABNORMAL LOW (ref >60)
Glucose, Bld: 121 mg/dL — ABNORMAL HIGH (ref 70–99)
Potassium: 4 mmol/L (ref 3.5–5.1)
Sodium: 133 mmol/L — ABNORMAL LOW (ref 135–145)

## 2019-10-28 LAB — CBC
HCT: 30.5 % — ABNORMAL LOW (ref 36.0–46.0)
Hemoglobin: 10 g/dL — ABNORMAL LOW (ref 12.0–15.0)
MCH: 33.7 pg (ref 26.0–34.0)
MCHC: 32.8 g/dL (ref 30.0–36.0)
MCV: 102.7 fL — ABNORMAL HIGH (ref 80.0–100.0)
Platelets: 123 10*3/uL — ABNORMAL LOW (ref 150–400)
RBC: 2.97 MIL/uL — ABNORMAL LOW (ref 3.87–5.11)
RDW: 13.5 % (ref 11.5–15.5)
WBC: 8.2 10*3/uL (ref 4.0–10.5)
nRBC: 0 % (ref 0.0–0.2)

## 2019-10-28 LAB — GLUCOSE, CAPILLARY
Glucose-Capillary: 105 mg/dL — ABNORMAL HIGH (ref 70–99)
Glucose-Capillary: 148 mg/dL — ABNORMAL HIGH (ref 70–99)
Glucose-Capillary: 192 mg/dL — ABNORMAL HIGH (ref 70–99)
Glucose-Capillary: 186 mg/dL — ABNORMAL HIGH (ref 70–99)

## 2019-10-28 MED ORDER — LIDOCAINE HCL 1 % IJ SOLN
INTRAMUSCULAR | Status: AC
  Filled 2019-10-28: qty 20

## 2019-10-28 MED ORDER — GLUCAGON HCL (RDNA) 1 MG IJ SOLR
INTRAMUSCULAR | Status: AC | PRN
  Administered 2019-10-28: .5 mg via INTRAVENOUS

## 2019-10-28 MED ORDER — FENTANYL CITRATE (PF) 100 MCG/2ML IJ SOLN
INTRAMUSCULAR | Status: AC
  Filled 2019-10-28: qty 4

## 2019-10-28 MED ORDER — GLUCAGON HCL RDNA (DIAGNOSTIC) 1 MG IJ SOLR
INTRAMUSCULAR | Status: AC
  Filled 2019-10-28: qty 1

## 2019-10-28 MED ORDER — MIDAZOLAM HCL 2 MG/2ML IJ SOLN
INTRAMUSCULAR | Status: AC | PRN
  Administered 2019-10-28 (×2): 1 mg via INTRAVENOUS

## 2019-10-28 MED ORDER — OSMOLITE 1.5 CAL PO LIQD
1000.0000 mL | ORAL | Status: DC
  Administered 2019-10-28 – 2019-10-29 (×2): 1000 mL
  Filled 2019-10-28 (×3): qty 1000

## 2019-10-28 MED ORDER — PROSOURCE TF PO LIQD
45.0000 mL | Freq: Every day | ORAL | Status: DC
  Administered 2019-10-28 – 2019-10-31 (×4): 45 mL
  Filled 2019-10-28 (×5): qty 45

## 2019-10-28 MED ORDER — FLUCONAZOLE 100 MG PO TABS
100.0000 mg | ORAL_TABLET | Freq: Every day | ORAL | Status: AC
  Administered 2019-10-29 – 2019-10-31 (×3): 100 mg via ORAL
  Filled 2019-10-28 (×3): qty 1

## 2019-10-28 MED ORDER — CEFAZOLIN SODIUM-DEXTROSE 2-4 GM/100ML-% IV SOLN
INTRAVENOUS | Status: AC
  Administered 2019-10-28: 2 g via INTRAVENOUS
  Filled 2019-10-28: qty 100

## 2019-10-28 MED ORDER — MIDAZOLAM HCL 2 MG/2ML IJ SOLN
INTRAMUSCULAR | Status: AC
  Filled 2019-10-28: qty 4

## 2019-10-28 MED ORDER — FENTANYL CITRATE (PF) 100 MCG/2ML IJ SOLN
INTRAMUSCULAR | Status: AC | PRN
  Administered 2019-10-28 (×2): 50 ug via INTRAVENOUS

## 2019-10-28 NOTE — Progress Notes (Signed)
Physical Therapy Treatment Patient Details Name: Kelly [name] MRN: [id number] DOB: 1956/01/12 Today's Date: 10/28/2019    History of Present Illness 64 yo female presenting to ED with weakness, nausea, and poor oral intake. CT chest concerning for interstitial lung disease and emphysema, CT abdomen suggestive of esophagitis and infectious/inflammatory colitis. PMH including COPD, tobacco abuse, chronic diastolic CHF, CAD/PCI and stents, HTN, and dyslipidemia.    PT Comments    Pt in bed upon arrival of PT, agreeable to session with focus on progression of OOB mobility and transfers. The pt was able to demo improved ability to take lateral steps and improved activity tolerance from previous session, but remains limited to ambulating a few feet at a time due to reports of fatigue and pain. The pt was educated in a series of exercises for BLE to improve strength and increase activity outside of PT sessions. The pt was able to demo with good technique, and no increase in pain. The pt will continue to benefit from skilled PT to further progress functional ambulation stability and endurance, as well as functional strength and stability to reduce risk of falls and fall-related injuries at home following d/c.     Follow Up Recommendations  SNF;Supervision for mobility/OOB     Equipment Recommendations  Rolling walker with 5" wheels;3in1 (PT);Other (comment) (wc if d/c home)    Recommendations for Other Services       Precautions / Restrictions Precautions Precautions: Fall Precaution Comments: G-tube Restrictions Weight Bearing Restrictions: No    Mobility  Bed Mobility Overal bed mobility: Needs Assistance Bed Mobility: Supine to Sit     Supine to sit: Min guard Sit to supine: Min assist   General bed mobility comments: minA with increased time to return to bed to lift BLE into bed. pt then able to reposition with use of bridge position. supervision and extra time to come to sitting  EOB  Transfers Overall transfer level: Needs assistance Equipment used: Rolling walker (2 wheeled) Transfers: Sit to/from [employer] Sit to Stand: Min assist Stand pivot transfers: Min assist       General transfer comment: minA to power up with cues for hand placement and standing posture, no assist needed to steady. minA to facilitate RW movement with lateral stepping to recliner and back to bed.  Ambulation/Gait Ambulation/Gait assistance: Min assist Gait Distance (Feet): 5 Feet Assistive device: Rolling walker (2 wheeled) Gait Pattern/deviations: Step-to pattern;Decreased stride length;Shuffle;Trunk flexed Gait velocity: decreased Gait velocity interpretation: <1.31 ft/sec, indicative of household ambulator General Gait Details: pt able to take small lateral steps in both directions (5 ft x2) with use of RW and minA for RW movement. pt with significant trunk flexion (almost 90 deg) despite cues for improved upright posture.       Balance Overall balance assessment: Needs assistance;History of Falls Sitting-balance support: No upper extremity supported;Feet supported Sitting balance-Leahy Scale: Fair     Standing balance support: Bilateral upper extremity supported Standing balance-Leahy Scale: Poor Standing balance comment: Reliant on UE support, trunk very flexed                            Cognition Arousal/Alertness: Awake/alert Behavior During Therapy: Flat affect Overall Cognitive Status: No family/caregiver present to determine baseline cognitive functioning                                 General  Comments: pt with flat affect but answering questions when asked.      Exercises General Exercises - Lower Extremity Long Arc Quad: AROM;Both;10 reps;Seated (x10 AROM, x5 against minimal manual resistance) Heel Slides: AROM;Seated;Both;10 reps (x10 AROM, x5 against minimal manual resistance) Hip Flexion/Marching:  AROM;Both;15 reps;Seated Heel Raises: AROM;Seated;Both;15 reps    General Comments General comments (skin integrity, edema, etc.): HR to 95 with lateral stepping, SpO2 in 90s.      Pertinent Vitals/Pain Pain Assessment: Faces Faces Pain Scale: Hurts little more Pain Location: stomach Pain Descriptors / Indicators: Grimacing;Sore Pain Intervention(s): Limited activity within patient's tolerance;Monitored during session;Repositioned           PT Goals (current goals can now be found in the care plan section) Acute Rehab PT Goals Patient Stated Goal: Go home PT Goal Formulation: With patient Time For Goal Achievement: 11/05/19 Potential to Achieve Goals: Good Progress towards PT goals: Progressing toward goals    Frequency    Min 3X/week      PT Plan Current plan remains appropriate       AM-PAC PT "6 Clicks" Mobility   Outcome Measure  Help needed turning from your back to your side while in a flat bed without using bedrails?: None Help needed moving from lying on your back to sitting on the side of a flat bed without using bedrails?: A Little Help needed moving to and from a bed to a chair (including a wheelchair)?: A Little Help needed standing up from a chair using your arms (e.g., wheelchair or bedside chair)?: A Little Help needed to walk in hospital room?: A Lot Help needed climbing 3-5 steps with a railing? : Total 6 Click Score: 16    End of Session Equipment Utilized During Treatment: Gait belt Activity Tolerance: Patient tolerated treatment well Patient left: with call bell/phone within reach;in bed;with bed alarm set Nurse Communication: Mobility status PT Visit Diagnosis: Muscle weakness (generalized) (M62.81);Difficulty in walking, not elsewhere classified (R26.2);History of falling (Z91.81)     Time: 1337-1400 PT Time Calculation (min) (ACUTE ONLY): 23 min  Charges:  $Gait Training: 8-22 mins $Therapeutic Exercise: 8-22 mins                      Rolm Baptise, PT, DPT   Acute Rehabilitation Department Pager #: [phone]   Gaetana Michaelis 10/28/2019, 3:03 PM

## 2019-10-28 NOTE — Progress Notes (Signed)
Progress Note    Kelly [name]  WUJ:[id number] DOB: [birth year]  DOA: 10/21/2019 PCP: Arvilla Market, DO    Brief Narrative:    Medical records reviewed and are as summarized below:  Artina Minella is an 64 y.o. female *with history of COPD, tobacco abuse, chronic diastolic CHF, CAD/PCI and stents, hypertension, dyslipidemia presented to the emergency room 8/20 with worsening weakness, nausea, dysphagia and odynophagia, poor oral intake for over 2 months, this has been slowly progressive.  She has lost about 25 pounds, denies any fevers or chills, continues to smoke a pack of cigarettes a day. -Work-up in the ED noted mild hypotension but improved with saline, severe hypokalemia CT chest concerning for interstitial lung disease and emphysema, CT abdomen suggestive of esophagitis and infectious/inflammatory colitis -[facility] following , EGD was unremarkable, biopsy positive for Candida -Barium esophagram/evaluation for dysmotility was limited  Assessment/Plan:   Principal Problem:   Esophagitis Active Problems:   Dysphagia   Unintentional weight loss   Colitis   Hypokalemia   Malnutrition of moderate degree   Dysphagia/Nausea Severe protein calorie malnutrition/weight loss Adult failure to thrive -History of dysphagia and nausea progressively worsening for months, no overt findings concerning for malignancy based on CT chest/abdomen pelvis [facility]ology consulting, endoscopy 8/21 was unremarkable, follow-up biopsy, Ba esophagram w/out overt findings, limited evaluation of dysmotility due to patient's nausea and inability to stand -candida noted on path-starting Fluconazole -MBS: Dysphagia, pharyngoesophageal phase: dys 1 thin liquids -TSH and random cortisol within normal limits -started marinol, scheduled zofran -G tube placed for further nutritional support- tube feeds being started  Chronic diarrhea -CT abdomen with questionable mild colitis,  clinically do not suspect active colitis -Antibiotics discontinued, could be malabsorption related -C. 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 (Active)  10/22/19 0010  Location: Hip  Location Orientation: Left  Staging: Unstageable - Full thickness tissue loss in which the base of the injury is covered by slough (yellow, tan, gray, green or brown) and/or eschar (tan, brown or black) in the wound bed.  Wound Description (Comments):   Present on Admission: yes   Nutrition Status: Nutrition Problem: Moderate Malnutrition Etiology: chronic illness (dysphagia, esophagitis) Signs/Symptoms: mild fat depletion, mild muscle depletion, energy intake < or equal to 75% for > or equal to 1 month Interventions: Tube feeding    Family Communication/Anticipated D/C date and plan/Code Status   DVT prophylaxis: Lovenox ordered. Code Status: Full Code.  Called son- left message Disposition Plan: Status is: Inpatient  Remains inpatient appropriate because:Inpatient level of care appropriate due to severity of illness   Dispo: The patient is from: Home              Anticipated d/c is to: Home              Anticipated d/c date is: 1-2 days  Patient currently is not medically stable to d/c.         Medical Consultants:     GI  IR  Subjective:   Just back from  G tube  Objective:    Vitals:   10/28/19 1140 10/28/19 1145 10/28/19 1150 10/28/19 1209  BP: (!) 160/77 (!) 144/50 (!) 136/50 (!) 125/52  Pulse: (!) 104 98 76 68  Resp: 16 16 16 18   Temp:    98.9 F (37.2 C)  TempSrc:    Oral  SpO2: 99% 98% 97% 99%  Weight:      Height:        Intake/Output Summary (Last 24 hours) at 10/28/2019 1455 Last data filed at 10/28/2019 0900 Gross per 24 hour  Intake 360 ml  Output 1000 ml  Net -640 ml   Filed Weights   10/24/19 1000 10/24/19 1938  Weight: 48.4 kg 49.1 kg    Exam:   General: Appearance:    Cachectic female in no acute distress, older than stated age     Lungs:     Clear to auscultation bilaterally, respirations unlabored  Heart:    Normal heart rate. Normal rhythm. No murmurs, rubs, or gallops.   MS:   All extremities are intact.   Neurologic:   Awake, alert, oriented x 3. No apparent focal neurological           defect.     Data Reviewed:   I have personally reviewed following labs and imaging studies:  Labs: Labs show the following:   Basic Metabolic Panel: Recent Labs  Lab 10/21/19 1730 10/22/19 0041 10/22/19 0041 10/23/19 0815 10/23/19 10/25/19 10/24/19 10/26/19 10/24/19 10/26/19 10/25/19 10/27/19 10/25/19 10/27/19 10/26/19 10/28/19 10/26/19 0724 10/27/19 0150 10/28/19 0437  NA  --  136   < > 136   < > 138  --  135  --  136  --  135 133*  K  --  3.7   < > 3.2*   < > 3.7   < > 3.3*   < > 3.6   < > 3.3* 4.0  CL  --  107   < > 107   < > 109  --  106  --  107  --  107 107  CO2  --  25   < > 17*   < > 17*  --  22  --  23  --  22 22  GLUCOSE  --  77   < > 75   < > 57*  --  131*  --  112*  --  154* 121*  BUN  --  11   < > 10   < > 8  --  14  --  16  --  16 18  CREATININE  --  1.13*   < > 1.07*   < > 1.09*  --  1.07*  --  0.93  --  0.95 1.01*  CALCIUM  --  7.4*   < > 7.5*   < > 7.8*  --  8.0*  --  7.6*  --  7.4* 7.6*  MG 1.6* 1.6*  --  2.0  --   --   --   --   --   --   --  1.8  --    PHOS  --   --   --   --   --   --   --   --   --   --   --  2.6  --    < > = values in this interval not displayed.   GFR Estimated Creatinine Clearance: 42.5 mL/min (A) (by C-G formula based on SCr of 1.01 mg/dL (H)). Liver Function Tests: Recent Labs  Lab 10/23/19 0815 10/24/19 0322 10/25/19 0632 10/26/19 0724  AST 8* 8* 8* 7*  ALT 6 6 7 7   ALKPHOS 56 52 42 38  BILITOT 0.5 0.9 0.5 0.3  PROT 5.1* 5.1* 4.7* 4.5*  ALBUMIN 2.2* 2.2* 2.1* 1.9*   No results for input(s): LIPASE, AMYLASE in the last 168 hours. No results for input(s): AMMONIA in the last 168 hours. Coagulation profile No results for input(s): INR, PROTIME in the last 168 hours.  CBC: Recent Labs  Lab 10/24/19 0322 10/25/19 0632 10/26/19 0724 10/27/19 0150 10/28/19 0437  WBC 10.1 10.2 8.8 8.9 8.2  HGB 11.4* 10.6* 10.2* 9.5* 10.0*  HCT 33.6* 31.3* 30.1* 28.7* 30.5*  MCV 102.4* 101.3* 101.7* 101.1* 102.7*  PLT 137* 128* 104* 101* 123*   Cardiac Enzymes: No results for input(s): CKTOTAL, CKMB, CKMBINDEX, TROPONINI in the last 168 hours. BNP (last 3 results) No results for input(s): PROBNP in the last 8760 hours. CBG: Recent Labs  Lab 10/26/19 2040 10/27/19 1657 10/27/19 2100 10/28/19 0646 10/28/19 1210  GLUCAP 291* 311* 194* 105* 148*   D-Dimer: No results for input(s): DDIMER in the last 72 hours. Hgb A1c: Recent Labs    10/28/19 0437  HGBA1C 5.3   Lipid Profile: No results for input(s): CHOL, HDL, LDLCALC, TRIG, CHOLHDL, LDLDIRECT in the last 72 hours. Thyroid function studies: No results for input(s): TSH, T4TOTAL, T3FREE, THYROIDAB in the last 72 hours.  Invalid input(s): FREET3 Anemia work up: No results for input(s): VITAMINB12, FOLATE, FERRITIN, TIBC, IRON, RETICCTPCT in the last 72 hours. Sepsis Labs: Recent Labs  Lab 10/22/19 0041 10/23/19 0815 10/25/19 10/27/19 10/26/19 0724 10/27/19 0150 10/28/19 0437  WBC  --    < > 10.2 8.8 8.9 8.2  LATICACIDVEN 1.0  --   --   --   --    --    < > = values in this interval not displayed.    Microbiology Recent Results (from the past 240 hour(s))  SARS Coronavirus 2 by RT PCR (hospital order, performed in [facility] hospital lab) Nasopharyngeal Nasopharyngeal Swab     Status: None   Collection Time: 10/21/19  5:01 PM   Specimen: Nasopharyngeal Swab  Result Value Ref Range Status   SARS Coronavirus 2 NEGATIVE NEGATIVE Final    Comment: (NOTE) SARS-CoV-2 target nucleic acids are NOT DETECTED.  The SARS-CoV-2 RNA is generally detectable in upper and lower respiratory specimens during the acute phase of infection. The lowest concentration of SARS-CoV-2 viral copies this assay can detect is 250 copies / mL. A negative result does not preclude SARS-CoV-2 infection and should not be used as the sole basis for treatment or other patient management decisions.  A negative result may occur with improper specimen collection / handling, submission of specimen other than nasopharyngeal swab, presence of viral mutation(s) within the areas targeted by this assay, and inadequate number of viral copies (<250 copies / mL). A negative result must be combined with clinical observations, patient history, and epidemiological information.  Fact Sheet for Patients:   10/23/19  Fact Sheet for Healthcare Providers: BoilerBrush.com.cy  This test is not yet approved or  cleared by the https://pope.com/ FDA and has been authorized for detection and/or diagnosis of SARS-CoV-2 by FDA under an Emergency  Use Authorization (EUA).  This EUA will remain in effect (meaning this test can be used) for the duration of the COVID-19 declaration under Section 564(b)(1) of the Act, 21 U.S.C. section 360bbb-3(b)(1), unless the authorization is terminated or revoked sooner.  Performed at [facility], [address], [place], [place] [address]   Culture, blood (routine x 2)     Status: None    Collection Time: 10/22/19 12:41 AM   Specimen: BLOOD  Result Value Ref Range Status   Specimen Description BLOOD LEFT ANTECUBITAL  Final   Special Requests   Final    BOTTLES DRAWN AEROBIC AND ANAEROBIC Blood Culture adequate volume   Culture   Final    NO GROWTH 5 DAYS Performed at [facility], [address], [place], [place] [address]    Report Status 10/27/2019 FINAL  Final  Culture, blood (routine x 2)     Status: None   Collection Time: 10/22/19 12:47 AM   Specimen: BLOOD RIGHT HAND  Result Value Ref Range Status   Specimen Description BLOOD RIGHT HAND  Final   Special Requests   Final    BOTTLES DRAWN AEROBIC ONLY Blood Culture adequate volume   Culture   Final    NO GROWTH 5 DAYS Performed at [facility], [address] [place], [place] [address]    Report Status 10/27/2019 FINAL  Final  Gastrointestinal Panel by PCR , Stool     Status: None   Collection Time: 10/22/19  4:45 PM   Specimen: STOOL  Result Value Ref Range Status   Campylobacter species NOT DETECTED NOT DETECTED Final   Plesimonas shigelloides NOT DETECTED NOT DETECTED Final   Salmonella species NOT DETECTED NOT DETECTED Final   Yersinia enterocolitica NOT DETECTED NOT DETECTED Final   Vibrio species NOT DETECTED NOT DETECTED Final   Vibrio cholerae NOT DETECTED NOT DETECTED Final   Enteroaggregative E coli (EAEC) NOT DETECTED NOT DETECTED Final   Enteropathogenic E coli (EPEC) NOT DETECTED NOT DETECTED Final   Enterotoxigenic E coli (ETEC) NOT DETECTED NOT DETECTED Final   Shiga like toxin producing E coli (STEC) NOT DETECTED NOT DETECTED Final   Shigella/Enteroinvasive E coli (EIEC) NOT DETECTED NOT DETECTED Final   Cryptosporidium NOT DETECTED NOT DETECTED Final   Cyclospora cayetanensis NOT DETECTED NOT DETECTED Final   Entamoeba histolytica NOT DETECTED NOT DETECTED Final   Giardia lamblia NOT DETECTED NOT DETECTED Final   Adenovirus F40/41 NOT DETECTED NOT DETECTED Final    Astrovirus NOT DETECTED NOT DETECTED Final   Norovirus GI/GII NOT DETECTED NOT DETECTED Final   Rotavirus A NOT DETECTED NOT DETECTED Final   Sapovirus (I, II, IV, and V) NOT DETECTED NOT DETECTED Final    Comment: Performed at [facility], [address] [place], [place] [address]  C Difficile Quick Screen w PCR reflex     Status: None   Collection Time: 10/22/19  4:45 PM   Specimen: STOOL  Result Value Ref Range Status   C Diff antigen NEGATIVE NEGATIVE Final   C Diff toxin NEGATIVE NEGATIVE Final   C Diff interpretation No C. difficile detected.  Final    Comment: Performed at [facility], [address] [place], [place] [address]    Procedures and diagnostic studies:  IR GASTROSTOMY TUBE MOD SED  Result Date: 10/28/2019 INDICATION: Dysphagia, malnutrition EXAM: FLUOROSCOPIC 20 FRENCH PULL-THROUGH GASTROSTOMY Date:  10/28/2019 10/28/2019 11:54 am Radiologist:  M. Ruel Favors, MD Guidance:  Fluoroscopic MEDICATIONS: Ancef 2 g; Antibiotics were administered within 1 hour of the procedure. Glucagon 0.5 mg IV ANESTHESIA/SEDATION: Versed 2.0 mg IV; Fentanyl 100 mcg IV Moderate Sedation Time:  14 minutes The patient was continuously monitored during the procedure by the interventional radiology nurse under my direct supervision. CONTRAST:  10 cc-administered into the gastric lumen. FLUOROSCOPY TIME:  Fluoroscopy Time: 2 minutes 36 seconds (9 mGy). COMPLICATIONS: None immediate. PROCEDURE: Informed consent was obtained from the patient following explanation of the procedure, risks, benefits and alternatives. The patient understands, agrees and consents for the procedure. All questions were addressed. A time out was performed. Maximal barrier sterile technique utilized including caps, mask, sterile gowns, sterile gloves, large sterile drape, hand hygiene, and betadine prep. The left upper quadrant was sterilely prepped and draped. An oral gastric catheter was inserted into the  stomach under fluoroscopy. The existing nasogastric feeding tube was removed. Air was injected into the stomach for insufflation and visualization under fluoroscopy. The air distended stomach was confirmed beneath the anterior abdominal wall in the frontal and lateral projections. Under sterile conditions and local anesthesia, a 17 gauge trocar needle was utilized to access the stomach percutaneously beneath the left subcostal margin. Needle position was confirmed within the stomach under biplane fluoroscopy. Contrast injection confirmed position also. A single T tack was deployed for gastropexy. Over an Amplatz guide wire, a 9-French sheath was inserted into the stomach. A snare device was utilized to capture the oral gastric catheter. The snare device was pulled retrograde from the stomach up the esophagus and out the oropharynx. The 20-French pull-through gastrostomy was connected to the snare device and pulled antegrade through the oropharynx down the esophagus into the stomach and then through the percutaneous tract external to the patient. The gastrostomy was assembled externally. Contrast injection confirms position in the stomach. Images were obtained for documentation. The patient tolerated procedure well. No immediate complication. IMPRESSION: Fluoroscopic insertion of a 20-French "pull-through" gastrostomy. Electronically Signed   By: Judie Petit.  Shick M.D.   On: 10/28/2019 11:57    Medications:   . citalopram  20 mg Oral QHS  . collagenase   Topical Daily  . diclofenac Sodium  4 g Topical QID  . dronabinol  2.5 mg Oral BID AC  . [START ON 10/29/2019] enoxaparin (LOVENOX) injection  40 mg Subcutaneous Q24H  . feeding supplement  1 Container Oral TID BM  . feeding supplement (PROSource TF)  45 mL Per Tube Daily  . fentaNYL      . [START ON 10/29/2019] fluconazole  100 mg Oral Daily  . glucagon (human recombinant)      . insulin aspart  0-5 Units Subcutaneous QHS  . insulin aspart  0-6 Units  Subcutaneous TID WC  . levothyroxine  25 mcg Oral Q0600  . lidocaine      . midazolam      . nicotine  21 mg Transdermal Daily  . ondansetron (ZOFRAN) IV  4 mg Intravenous TID AC  . pantoprazole  40 mg Oral Q1200  . pregabalin  75 mg Oral BID  . topiramate  25 mg Oral BID   Continuous Infusions: . feeding supplement (OSMOLITE 1.5 CAL)       LOS: 6 days   Joseph Art  [facility]   How to contact the [facility] Attending or Consulting provider 7A - 7P or covering provider during after hours 7P -7A, for this patient?  1. Check the care team in [facility] and look for a) attending/consulting TR[facility]  provider listed and b) the [facility] team listed 2. Log into [email or web address] and use 's universal password to access. If you do not have the password, please contact the hospital operator. 3. Locate the [facility] provider you are looking for under [facility] and page to a number that you can be directly reached. 4. If you still have difficulty reaching the provider, please page the [facility] (Director on Call) for the Hospitalists listed on amion for assistance.  10/28/2019, 2:55 PM

## 2019-10-28 NOTE — Progress Notes (Signed)
Dysphagia: Patient's G-tube successfully placed by interventional radiology.  Okay to continue dysphagia 1 diet in addition to tube feeding.  Diarrhea: Further conversation with patient about questionable colitis on CT and her history of diarrhea at home, which she indicates is not a daily thing and seems to be largely diet related.  I do not think this patient likely has colitis, but even if she did, I think the most appropriate management for it, if effective, would simply be loperamide.  The only question is whether loperamide should be dosed prn, as is currently being done in the hospital, or on a scheduled basis to prevent diarrhea in the first place.  This answer should become clear once tube feedings are started and we see whether they cause the patient to have diarrhea.  Dr. Marca Ancona covering for [place] this weekend.  Florencia Reasons, M.D. Pager [phone] If no answer or after 5 PM call [phone]

## 2019-10-28 NOTE — Progress Notes (Signed)
SLP Cancellation Note  Patient Details Name: Kelly [name] MRN: [id number] DOB: [birth year]   Cancelled treatment:       Reason Eval/Treat Not Completed: Patient at procedure or test/unavailable. Pt NPo for procedure, ?PEG tube today? Will f/u next week.    Nur Rabold, Riley Nearing 10/28/2019, 7:38 AM

## 2019-10-28 NOTE — H&P (Signed)
Chief Complaint: Patient was seen in consultation today for  Chief Complaint  Patient presents with  . Weakness    Referring Physician(s): Dr. Matthias Hughs  Supervising Physician: Ruel Favors  Patient Status: [name]  History of Present Illness: Kelly [name] is a 64 y.o. female with a medical history significant for tobacco abuse, HTN, CAD and CHF. She presented to the [facility] ED 10/21/19 with weakness, nausea, diarrhea and poor appetite. She had been having trouble swallowing for several months and had lost 20-25 pounds. Imaging showed findings of esophagitis and infectious/inflammatory colitis. Endoscopy and esophagram were unremarkable but pathology did find candida; fluconazole was started. Kelly [name] treatment this hospital course has been ineffective in improving her oral intake and nutritional status.  Interventional Radiology has been asked to evaluate this patient for the placement of a gastrostomy tube to augment her oral intake.     Past Medical History:  Diagnosis Date  . CAD (coronary artery disease)    5 stents in the past, 4 in [facility], 1 in [place] [facility]. Last cath 01/2017  . CHF (congestive heart failure) (HCC)   . 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 Hyperlipidemia LDL goal <70   . Hypertension     Past Surgical History:  Procedure Laterality Date  . BIOPSY  10/22/2019   Procedure: BIOPSY;  Surgeon: Bernette Redbird, MD;  Location: [facility] ENDOSCOPY;  Service: Endoscopy;;  . CARDIAC CATHETERIZATION     stents x 5  . ESOPHAGOGASTRODUODENOSCOPY (EGD) WITH PROPOFOL N/A 10/22/2019   Procedure: ESOPHAGOGASTRODUODENOSCOPY (EGD) WITH PROPOFOL;  Surgeon: Bernette Redbird, MD;  Location: [facility] ENDOSCOPY;  Service: Endoscopy;  Laterality: N/A;    Allergies: Morphine and related and Other  Medications: Prior to Admission medications   Medication Sig Start Date End Date Taking? Authorizing Provider   citalopram (CELEXA) 20 MG tablet Take 1 tablet (20 mg total) by mouth daily. Patient taking differently: Take 20 mg by mouth at bedtime.  09/14/19  Yes Arvilla Market, DO  diclofenac Sodium (VOLTAREN) 1 % GEL Apply 4 g topically 4 (four) times daily. Patient taking differently: Apply 4 g topically 4 (four) times daily as needed (pain).  09/14/19  Yes Arvilla Market, DO  levothyroxine (EUTHYROX) 25 MCG tablet TAKE 1 TABLET BY MOUTH ONCE DAILY BEFORE BREAKFAST Patient taking differently: Take 25 mcg by mouth daily before breakfast.  09/14/19  Yes Arvilla Market, DO  losartan (COZAAR) 25 MG tablet Take 1 tablet (25 mg total) by mouth daily. Patient taking differently: Take 25 mg by mouth at bedtime.  09/14/19  Yes Arvilla Market, DO  metoCLOPramide (REGLAN) 10 MG tablet Take 1 tablet (10 mg total) by mouth 2 (two) times daily as needed for nausea. 09/14/19  Yes Arvilla Market, DO  nitroGLYCERIN (NITROSTAT) 0.4 MG SL tablet Place 0.4 mg under the tongue every 5 (five) minutes as needed for chest pain.   Yes [provider]  ondansetron (ZOFRAN ODT) 4 MG disintegrating tablet Take 1 tablet (4 mg total) by mouth every 8 (eight) hours as needed for nausea or vomiting. 09/14/19  Yes Arvilla Market, DO  pantoprazole (PROTONIX) 40 MG tablet Take 1 tablet (40 mg total) by mouth daily. 09/14/19  Yes Arvilla Market, DO  pregabalin (LYRICA) 75 MG capsule Take 1 capsule (75 mg total) by mouth 2 (two) times daily. 09/14/19  Yes Arvilla Market, DO  topiramate (TOPAMAX) 25 MG  tablet Take 2 tablets (50 mg total) by mouth daily. Patient taking differently: Take 25 mg by mouth 2 (two) times daily.  09/14/19  Yes Arvilla Market, DO  metoprolol succinate (TOPROL-XL) 25 MG 24 hr tablet Take 0.5 tablets (12.5 mg total) by mouth daily. Patient not taking: Reported on 10/21/2019 09/14/19   Arvilla Market, DO   rosuvastatin (CRESTOR) 5 MG tablet Take 1 tablet (5 mg total) by mouth daily. Patient not taking: Reported on 10/21/2019 08/23/19   Arvilla Market, DO     Family History  Problem Relation Age of Onset  . Heart disease Mother        stents x 8 in her life. Passed away in Jul 11, 2014  . Alzheimer's disease Mother   . Alzheimer's disease Father     Social History   Socioeconomic History  . Marital status: Divorced    Spouse name: Not on file  . Number of children: Not on file  . Years of education: Not on file  . Highest education level: Not on file  Occupational History  . Not on file  Tobacco Use  . Smoking status: Current Every Day Smoker    Packs/day: 1.00    Years: 50.00    Pack years: 50.00  . Smokeless tobacco: Never Used  Substance and Sexual Activity  . 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 Drug use: Yes    Types: Marijuana  . Sexual activity: Not on file  Other Topics Concern  . Not on file  Social History Narrative  . Not on file   Social Determinants of Health   Financial Resource Strain:   . Difficulty of Paying Living Expenses: Not on file  Food Insecurity:   . Worried About Programme researcher, broadcasting/film/video in the Last Year: Not on file  . Ran Out of Food in the Last Year: Not on file  Transportation Needs:   . Lack of Transportation (Medical): Not on file  . Lack of Transportation (Non-Medical): Not on file  Physical Activity:   . Days of Exercise per Week: Not on file  . Minutes of Exercise per Session: Not on file  Stress:   . Feeling of Stress : Not on file  Social Connections:   . Frequency of Communication with Friends and Family: Not on file  . Frequency of Social Gatherings with Friends and Family: Not on file  . Attends Religious Services: Not on file  . Active Member of Clubs or Organizations: Not on file  . Attends Banker Meetings: Not on file  . Marital Status: Not on file     Review of Systems: A 12 point ROS discussed and pertinent positives are indicated in the HPI above.  All other systems are negative.  Review of Systems  Constitutional: Positive for appetite change and fatigue.  Respiratory: Negative for cough and shortness of breath.   Cardiovascular: Negative for chest pain.  Gastrointestinal: Negative for abdominal pain.  Musculoskeletal: Negative for back pain.  Neurological: Negative for headaches.    Vital Signs: BP (!) 126/40 (BP Location: Right Arm)   Pulse 68   Temp 98 F (36.7 C) (Oral)   Resp 18   Ht  (1.651 m)   Wt 108 lb 3.9 oz (49.1 kg)   SpO2 100%   BMI 18.01 kg/m   Physical Exam Constitutional:      General: She is not in acute  distress. HENT:     Mouth/Throat:     Mouth: Mucous membranes are dry.     Pharynx: Oropharynx is clear.  Cardiovascular:     Rate and Rhythm: Normal rate and regular rhythm.     Pulses: Normal pulses.     Heart sounds: Normal heart sounds.  Pulmonary:     Effort: Pulmonary effort is normal.     Breath sounds: Normal breath sounds.  Abdominal:     General: Bowel sounds are normal.     Palpations: Abdomen is soft.  Skin:    General: Skin is warm and dry.  Neurological:     Mental Status: She is alert and oriented to person, place, and time.     Imaging: DG Chest 2 View  Result Date: 10/21/2019 CLINICAL DATA:  Chest pain for 2-3 months, congestion, weakness, history coronary artery disease, CHF, hypertension EXAM: CHEST - 2 VIEW COMPARISON:  05/02/2018 FINDINGS: Normal heart size, mediastinal contours, and pulmonary vascularity. Multiple coronary stents. Atherosclerotic calcification aorta. Lungs mildly hyperinflated but clear. No acute infiltrate, pleural effusion or pneumothorax. Bones demineralized with scoliosis thoracic spine. IMPRESSION: Hyperinflated lungs without infiltrate. Electronically Signed   By: Ulyses Southward M.D.   On: 10/21/2019 14:17   CT CHEST ABDOMEN PELVIS W  CONTRAST  Result Date: 10/21/2019 CLINICAL DATA:  Generalized weakness, weight loss EXAM: CT CHEST, ABDOMEN, AND PELVIS WITH CONTRAST TECHNIQUE: Multidetector CT imaging of the chest, abdomen and pelvis was performed following the standard protocol during bolus administration of intravenous contrast. CONTRAST:  OMNIPAQUE IOHEXOL 300 MG/ML  SOLN COMPARISON:  CT 10/22/2018, radiograph 10/21/2019 FINDINGS: CT CHEST FINDINGS Cardiovascular: The aortic root is suboptimally assessed given cardiac pulsation artifact. Atherosclerotic plaque within the normal caliber aorta. No acute luminal abnormality. No periaortic stranding or hemorrhage. Shared origin of the brachiocephalic and left common carotid artery. Proximal great vessels demonstrate atheromatous plaque including calcification at the left vertebral artery ostium incompletely assessed on this non angiographic technique. Central pulmonary arteries are borderline enlarged. No large central or lobar filling defects on this non tailored examination. Cardiac size within normal limits. Three-vessel coronary artery disease is noted. No pericardial effusion. No major venous anomalies. Mediastinum/Nodes: No mediastinal fluid or gas. Postsurgical changes likely reflecting prior left hemithyroidectomy with a normal appearance of right lobe thyroid gland. No acute abnormality of the trachea. Mild circumferential thickening and mucosal hyperemia of the mid to distal thoracic esophagus with small amount of fluid the level of the mid esophagus. No worrisome mediastinal, hilar or axillary adenopathy. Lungs/Pleura: Nonspecific ill-defined centrilobular micro nodularity distributed throughout the bilateral upper lobes/apices as well as further areas of ground-glass opacity with a mid to upper lung predilection. Some mild diffuse airways thickening and scattered secretions are noted as well as a background of mild centrilobular emphysematous change. No focal consolidative  opacity is evident. No pneumothorax. No other concerning nodularity or pulmonary masses. Musculoskeletal: The osseous structures appear diffusely demineralized which may limit detection of small or nondisplaced fractures. No acute osseous abnormality or suspicious osseous lesion. Mild chest wall deformity associated with the severe dextrocurvature of the thoracolumbar spine. Multilevel degenerative changes in the spine. Milder degenerative changes in the bilateral shoulders. No acute or worrisome osseous lesions are evident. CT ABDOMEN PELVIS FINDINGS Hepatobiliary: Mildly heterogenous hepatic parenchymal enhancement. Small normal hepatic attenuation and smooth liver surface contour seen on the delayed phase imaging. No visible focal liver lesions. Mild intra and extrahepatic biliary ductal dilatation is possibly related to reservoir effect given the postsurgical  changes from prior cholecystectomy and similarity to comparison exam without visible intraductal gallstones. Pancreas: Unremarkable. No pancreatic ductal dilatation or surrounding inflammatory changes. Spleen: Normal in size. No concerning splenic lesions. Adrenals/Urinary Tract: Lobular thickening of the bilateral adrenal glands. No concerning dominant adrenal nodules. Mild bilateral cortical thinning both kidneys which otherwise enhance and excrete symmetrically. Nonspecific mild bilateral perinephric stranding is also unchanged from prior and is a nonspecific finding. No concerning renal masses. No urolithiasis or hydronephrosis. Urinary bladder is unremarkable. Stomach/Bowel: Thickened distal thoracic esophagus. Postsurgical changes at the GE junction may reflect prior fundoplication/wrap procedure. Duodenum with a normal course across the midline abdomen. No small bowel thickening or dilatation is evident. A normal appendix is visualized. Diffuse pancolonic edematous mural thickening and mucosal hyperemia with faint pericolonic hazy stranding. Lack of  formed stool in the colon. Fluid-filled rectum noted as well. Serpentine hyperenhancement towards the rectum likely reflecting hemorrhoidal enhancement. Vascular/Lymphatic: Atherosclerotic calcifications within the abdominal aorta and branch vessels. No aneurysm or ectasia. Paired renal arteries bilaterally. Atheromatous ostial narrowing at the SMA, IMA and renal artery origins. No enlarged abdominopelvic lymph nodes. Reproductive: Mildly retroverted uterus. No concerning adnexal lesions. Previously seen cystic lesion in the right ovary no longer readily apparent with quiescent appearance of the ovarian tissue. Other: Circumferential body wall edema. Suspect some mild superficial ulceration adjacent the bilateral greater trochanters and left ischial tuberosity without organized collection or abscess. No abdominopelvic free air or fluid. Mild body wall edema. No bowel containing hernias. Musculoskeletal: Diffuse muscular atrophy. Severe dextrocurvature of the thoracolumbar spine with apex at the L2-3 level and evidence of prior L3-L5 posterior spinal fusion and interbody cage placement. No evidence of hardware complication. Prior right iliac group bone harvest site. The osseous structures appear diffusely demineralized which may limit detection of small or nondisplaced fractures. No acute or suspicious osseous lesions are evident at this time. Multilevel degenerative changes in the spine. Additional degenerative changes in the hips and pelvis. IMPRESSION: 1. Nonspecific ill-defined centrilobular micro nodularity distributed throughout the bilateral upper lobes/apices as well as further areas of ground-glass opacity with a mid to upper lung predilection. Findings are suggestive of a potential interstitial process such as RB I LD particularly in this patient with current and extensive smoking history. 2. Background of mild centrilobular emphysema ( Emphysema (ICD10-J43.9).) 3. Mild circumferential thickening and  mucosal hyperemia of the mid to distal thoracic esophagus with small amount of fluid the level of the mid esophagus, suggestive of esophagitis. Consider correlation with outpatient endoscopy. 4. Diffuse pancolonic edematous mural thickening and mucosal hyperemia with faint pericolonic hazy stranding. Lack of formed stool in the colon. Serpentine hyperenhancement towards the rectum likely reflecting hemorrhoidal enhancement. Findings are suggestive of infectious or inflammatory colitis with rapid transit state. 5. Mildly heterogenous hepatic parenchymal enhancement, nonspecific but can be seen with hepatitis. Correlate with LFTs. 6. Biliary ductal prominence likely related to post cholecystectomy reservoir effect given stability and lack of visible intraductal gallstones. 7. Severe dextrocurvature of the thoracolumbar spine with apex at the L2-3 level and evidence of prior L3-L5 posterior spinal fusion and interbody cage placement. No evidence of hardware complication.Mild associated chest wall deformity. 8. Suspect some mild superficial ulceration adjacent the bilateral greater trochanters and left ischial tuberosity without organized collection or abscess. Correlate with visual inspection. 9. Three-vessel coronary artery disease. 10. Aortic Atherosclerosis (ICD10-I70.0). Ostial narrowing at the IMA, SMA and renal artery origins. Electronically Signed   By: Kreg Shropshire M.D.   On: 10/21/2019 19:35  DG Swallowing Func-Speech Pathology  Result Date: 10/25/2019 Objective Swallowing Evaluation: Type of Study: MBS-Modified Barium Swallow Study  Patient Details Name: Jhene Westmoreland MRN: [id number] Date of Birth: [birth year] Today's Date: 10/25/2019 Time: SLP Start Time (ACUTE ONLY): 1315 -SLP Stop Time (ACUTE ONLY): 1330 SLP Time Calculation (min) (ACUTE ONLY): 15 min Past Medical History: Past Medical History: Diagnosis Date . CAD (coronary artery disease)   5 stents in the past, 4 in [facility], [place]  [facility]. Last cath 01/2017 . CHF (congestive heart failure) (HCC)  . 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 Hyperlipidemia LDL goal <70  . Hypertension  Past Surgical History: Past Surgical History: Procedure Laterality Date . BIOPSY  10/22/2019  Procedure: BIOPSY;  Surgeon: Bernette Redbird, MD;  Location[facility] ENDOSCOPY;  Service: Endoscopy;; . CARDIAC CATHETERIZATION    stents x 5 . ESOPHAGOGASTRODUODENOSCOPY (EGD) WITH PROPOFOL N/A 10/22/2019  Procedure: ESOPHAGOGASTRODUODENOSCOPY (EGD) WITH PROPOFOL;  Surgeon: Bernette Redbird, MD;  Location: [facility] ENDOSCOPY;  Service: Endoscopy;  Laterality: N/A; HPI: Denzil Bristol is a 64 y.o. female with medical history significant of CAD status post PCI, chronic diastolic CHF, hypertension, hyperlipidemia presenting with complaints of generalized weakness, nausea, dysphagia, and poor oral intake. Patient states she has had difficulty swallowing for at least 2 months now and she has been losing a lot of weight. Both food and liquid gets stuck when she tries to swallow and it is painful. States she has lost at least 20 to 25 pounds. She feels nauseous but is not vomiting. She is hardly able to eat anything and when she does, she experiences diarrhea. Denies abdominal pain. Reports having a chronic cough due to history of smoking. She smokes 1 pack of cigarettes daily. Denies fevers, shortness of breath, or chest pain. She has not been vaccinated against Covid.  CT of the abdomen and pelvis with findings suggestive of esophagitis and infection or inflammatory colitis.  Subjective: alert, upright in chair for procedure Assessment / Plan / Recommendation CHL IP CLINICAL IMPRESSIONS 10/25/2019 Clinical Impression Pharyngoesophageal dysphagia evidenced with deficits in both swallow safety and efficiency. Cervical hardware noted C4/C5 from past cervical surgery with posterior pharyngeal wall anatomical position more anteriorly at level  of hardware. This suspected to contribute to incomplete epiglottic deflection which negatively impacting laryngeal vestibule closure. Deficits also noted in UES distention, base of tongue retraction, hyoid excursion, and diminished sensation. Pt with frequent during the swallow laryngeal penetration and frequent post swallow aspiration of liquid viscosities (thin, nectar thick, and honey thick liquids); inconsisently sensed. Vallecular and pyriform sinus residuals noted that spilled post swallow (worsened with thicker consistencies). Suspect dysphagia has been ongoing with chronic episodic aspiration. Chin tuck ineffective at improving airway protection. Swallow safety was maximized by smaller sips/bites, multiple swallows, and use of throat clear and or cough. Pt with barium tablet retention in distal esophagus that eventually passed into the stomach, barium retention exhibited as consistent with barium swallow evaluation.  Recommend continue dysphagia 1 (puree) thin liquids with medicines in puree, crush larger.   SLP Visit Diagnosis Dysphagia, pharyngoesophageal phase (R13.14) Attention and concentration deficit following -- Frontal lobe and executive function deficit following -- Impact on safety and function Moderate aspiration risk   CHL IP TREATMENT RECOMMENDATION 10/25/2019 Treatment Recommendations Therapy as outlined in treatment plan below   Prognosis 10/25/2019 Prognosis for Safe Diet Advancement Fair Barriers to Reach Goals -- Barriers/Prognosis Comment -- CHL IP DIET RECOMMENDATION 10/25/2019 SLP Diet Recommendations  Dysphagia 1 (Puree) solids;Thin liquid Liquid Administration via Cup Medication Administration Whole meds with puree Compensations Slow rate;Small sips/bites;Follow solids with liquid;Multiple dry swallows after each bite/sip;Clear throat intermittently Postural Changes Seated upright at 90 degrees;Remain semi-upright after after feeds/meals (Comment)   CHL IP OTHER RECOMMENDATIONS 10/25/2019  Recommended Consults -- Oral Care Recommendations Oral care BID Other Recommendations --   CHL IP FOLLOW UP RECOMMENDATIONS 10/23/2019 Follow up Recommendations Other (comment)   CHL IP FREQUENCY AND DURATION 10/25/2019 Speech Therapy Frequency (ACUTE ONLY) min 1 x/week Treatment Duration 1 week      CHL IP ORAL PHASE 10/25/2019 Oral Phase WFL Oral - Pudding Teaspoon -- Oral - Pudding Cup -- Oral - Honey Teaspoon -- Oral - Honey Cup -- Oral - Nectar Teaspoon -- Oral - Nectar Cup -- Oral - Nectar Straw -- Oral - Thin Teaspoon -- Oral - Thin Cup -- Oral - Thin Straw -- Oral - Puree -- Oral - Mech Soft -- Oral - Regular -- Oral - Multi-Consistency -- Oral - Pill -- Oral Phase - Comment --  CHL IP PHARYNGEAL PHASE 10/25/2019 Pharyngeal Phase Impaired Pharyngeal- Pudding Teaspoon -- Pharyngeal -- Pharyngeal- Pudding Cup -- Pharyngeal -- Pharyngeal- Honey Teaspoon -- Pharyngeal -- Pharyngeal- Honey Cup Penetration/Aspiration during swallow;Penetration/Apiration after swallow;Trace aspiration;Pharyngeal residue - valleculae;Pharyngeal residue - pyriform;Reduced laryngeal elevation;Reduced pharyngeal peristalsis;Reduced epiglottic inversion;Reduced tongue base retraction Pharyngeal Material enters airway, remains ABOVE vocal cords and not ejected out;Material enters airway, passes BELOW cords without attempt by patient to eject out (silent aspiration) Pharyngeal- Nectar Teaspoon -- Pharyngeal -- Pharyngeal- Nectar Cup Reduced epiglottic inversion;Reduced laryngeal elevation;Reduced tongue base retraction;Pharyngeal residue - valleculae;Pharyngeal residue - pyriform;Penetration/Aspiration during swallow;Penetration/Apiration after swallow;Reduced pharyngeal peristalsis;Reduced airway/laryngeal closure Pharyngeal Material enters airway, remains ABOVE vocal cords and not ejected out Pharyngeal- Nectar Straw -- Pharyngeal -- Pharyngeal- Thin Teaspoon -- Pharyngeal -- Pharyngeal- Thin Cup Penetration/Aspiration during  swallow;Penetration/Apiration after swallow;Reduced epiglottic inversion;Reduced tongue base retraction;Reduced laryngeal elevation;Pharyngeal residue - valleculae;Pharyngeal residue - pyriform;Reduced pharyngeal peristalsis Pharyngeal Material enters airway, passes BELOW cords without attempt by patient to eject out (silent aspiration);Material enters airway, remains ABOVE vocal cords and not ejected out;Material enters airway, CONTACTS cords and not ejected out Pharyngeal- Thin Straw -- Pharyngeal -- Pharyngeal- Puree Reduced epiglottic inversion;Reduced laryngeal elevation;Reduced tongue base retraction;Reduced airway/laryngeal closure;Penetration/Aspiration during swallow;Penetration/Apiration after swallow;Pharyngeal residue - valleculae;Pharyngeal residue - pyriform Pharyngeal Material enters airway, remains ABOVE vocal cords and not ejected out Pharyngeal- Mechanical Soft Delayed swallow initiation-vallecula;Reduced epiglottic inversion;Reduced tongue base retraction;Reduced laryngeal elevation;Pharyngeal residue - valleculae;Pharyngeal residue - pyriform Pharyngeal -- Pharyngeal- Regular -- Pharyngeal -- Pharyngeal- Multi-consistency -- Pharyngeal -- Pharyngeal- Pill Reduced tongue base retraction;Reduced laryngeal elevation;Pharyngeal residue - pyriform;Pharyngeal residue - valleculae;Penetration/Aspiration during swallow;Penetration/Apiration after swallow;Reduced epiglottic inversion Pharyngeal Material enters airway, passes BELOW cords without attempt by patient to eject out (silent aspiration) Pharyngeal Comment --  CHL IP CERVICAL ESOPHAGEAL PHASE 10/25/2019 Cervical Esophageal Phase Impaired Pudding Teaspoon -- Pudding Cup -- Honey Teaspoon -- Honey Cup Reduced cricopharyngeal relaxation Nectar Teaspoon -- Nectar Cup Reduced cricopharyngeal relaxation Nectar Straw -- Thin Teaspoon -- Thin Cup Reduced cricopharyngeal relaxation Thin Straw -- Puree Reduced cricopharyngeal relaxation Mechanical Soft  Reduced cricopharyngeal relaxation Regular -- Multi-consistency -- Pill Reduced cricopharyngeal relaxation Cervical Esophageal Comment -- Chelsea E Hartness MA, CCC-SLP 10/25/2019, 3:46 PM              DG ESOPHAGUS W SINGLE CM (SOL OR THIN BA)  Result Date: 10/24/2019 CLINICAL DATA:  Dysphagia EXAM: ESOPHOGRAM/BARIUM SWALLOW TECHNIQUE: Single contrast examination was performed using  thin barium. FLUOROSCOPY TIME:  Fluoroscopy Time:  1 minutes 30 seconds Radiation Exposure Index (if provided by the fluoroscopic device): 5.80 Number of Acquired Spot Images: 1 COMPARISON:  Chest CT from October 21, 2019 FINDINGS: Thin barium was administered in small swallows were performed in oblique and lateral positioning at the initial portion of the exam which showed deep penetration and coating of the larynx with barium contrast material. No frank aspiration was noted though larger volume swallows were not performed due to this finding. Initial images show changes of C4-5 spinal fusion with ACDF, partially visualized. Esophageal dysmotility with suggested on subsequent imaging. Mucosal assessment not possible and evidence of retained material in the esophagus with tertiary peristalsis. After the initial 2-3 swallows the patient began to rash and vomit small quantities of ingested material. For this reason the examination was completed and the patient was returned to the floor. IMPRESSION: 1. Signs of deep penetration and coating of the larynx with barium contrast material. No frank aspiration was noted though larger volume swallows were not performed. Dedicated swallow study is suggested for further evaluation. 2. Esophageal dysmotility. Very limited exam due to patient's inability to stand for the evaluation and due to nausea and small volume emesis. Electronically Signed   By: Donzetta Kohut M.D.   On: 10/24/2019 11:09    Labs:  CBC: Recent Labs    10/25/19 0632 10/26/19 0724 10/27/19 0150 10/28/19 0437  WBC 10.2  8.8 8.9 8.2  HGB 10.6* 10.2* 9.5* 10.0*  HCT 31.3* 30.1* 28.7* 30.5*  PLT 128* 104* 101* 123*    COAGS: No results for input(s): INR, APTT in the last 8760 hours.  BMP: Recent Labs    10/25/19 0632 10/26/19 0724 10/27/19 0150 10/28/19 0437  NA 135 136 135 133*  K 3.3* 3.6 3.3* 4.0  CL 106 107 107 107  CO2 22 23 22 22   GLUCOSE 131* 112* 154* 121*  BUN 14 16 16 18   CALCIUM 8.0* 7.6* 7.4* 7.6*  CREATININE 1.07* 0.93 0.95 1.01*  GFRNONAA 54* >60 >60 58*  GFRAA >60 >60 >60 >60    LIVER FUNCTION TESTS: Recent Labs    10/23/19 0815 10/24/19 0322 10/25/19 0632 10/26/19 0724  BILITOT 0.5 0.9 0.5 0.3  AST 8* 8* 8* 7*  ALT 6 6 7 7   ALKPHOS 56 52 42 38  PROT 5.1* 5.1* 4.7* 4.5*  ALBUMIN 2.2* 2.2* 2.1* 1.9*    TUMOR MARKERS: No results for input(s): AFPTM, CEA, CA199, CHROMGRNA in the last 8760 hours.  Assessment and Plan:  Dysphagia; poor oral intake; malnutrition: Kelly [name], 64-year-female, is scheduled for an image-guided gastrostomy tube placement, possibly today, 10/28/19. She has had several month's of nausea, poor appetite, dysphagia and weight loss. No overt findings this hospital admission to explain this and her treatments as an inpatient have not improved her symptoms.  Risks and benefits of a gastrostomy tube placement were discussed with the patient including, but not limited to bleeding, infection, vascular injury, pneumothorax which may require chest tube placement, air embolism or even death  All of the patient's questions were answered, patient is agreeable to proceed.  The patient has been NPO. Labs and vitals have been reviewed. Last dose of lovenox was 10/26/19 at 2224.  Consent signed and in chart.    Thank you for this interesting consult.  I greatly enjoyed meeting Kelly [name] and look forward to participating in their care.  A copy of this report was sent to the requesting provider on  this date.  Electronically Signed: Alwyn Ren,  AGACNP-BC [phone] 10/28/2019, 7:59 AM   I spent a total of 20 Minutes    in face to face in clinical consultation, greater than 50% of which was counseling/coordinating care for image-guided gastrostomy tube.

## 2019-10-28 NOTE — Progress Notes (Signed)
PHARMACIST - PHYSICIAN COMMUNICATION  DR:   Benjamine Mola  CONCERNING: IV to Oral Route Change Policy  RECOMMENDATION: This patient is receiving fluconazole by the intravenous route.  Based on criteria approved by the Pharmacy and Therapeutics Committee, the intravenous medication(s) is/are being converted to the equivalent oral dose form(s).   DESCRIPTION: These criteria include:  The patient is eating (either orally or via tube) and/or has been taking other orally administered medications for a least 24 hours  The patient has no evidence of active gastrointestinal bleeding or impaired GI absorption (gastrectomy, short bowel, patient on TNA or NPO).  If you have questions about this conversion, please contact the Pharmacy Department  []   [phone] )  ( [phone] []   [phone] )  [facility] [x]   [phone] )  [facility] []   [phone] )  [facility] []   [phone] )  [facility]   [place], [facility] 10/28/2019 1:35 PM

## 2019-10-28 NOTE — Procedures (Signed)
Interventional Radiology Procedure Note  Procedure: fluoro GTUBE 20 FR    Complications: None  Estimated Blood Loss:  min  Findings: Confirmed in stomach    Sharen Counter, MD

## 2019-10-28 NOTE — Plan of Care (Signed)
  Problem: Pain Managment: Goal: General experience of comfort will improve Outcome: Progressing   

## 2019-10-28 NOTE — TOC Progression Note (Signed)
Transition of Care [facility]) - Progression Note    Patient Details  Name: Kelly [name] MRN: [id number] Date of Birth: [birth year]  Transition of Care [facility]) CM/SW Contact  Bess Kinds, RN Phone Number: [phone] 10/28/2019, 5:22 PM  Clinical Narrative:     Patient received g-tube today. Tube Feeding order placed. Referral sent to [facility]. Spoke with patient about home health agency. Referral accepted by [facility] for RN, PT. Patient will need HH orders for RN, PT with Face to Face at time of discharge. TOC following for transition needs.   Expected Discharge Plan: Home w Home Health Services Barriers to Discharge: Continued Medical Work up  Expected Discharge Plan and Services Expected Discharge Plan: Home w Home Health Services In-house Referral: Clinical Social Work Discharge Planning Services: CM Consult Post Acute Care Choice: Home Health Living arrangements for the past 2 months: Single Family Home                 DME Arranged: Tube feeding DME Agency: Other - Comment [employer]) Date DME Agency Contacted: 10/28/19 Time DME Agency Contacted: 20 Representative spoke with at DME Agency: Pam HH Arranged: RN, PT HH Agency: [facility] Date [facility] Agency Contacted: 10/28/19 Time HH Agency Contacted: 1721 Representative spoke with at [facility] Agency: Kandee Keen   Social Determinants of Health (SDOH) Interventions    Readmission Risk Interventions No flowsheet data found.

## 2019-10-29 LAB — CBC
HCT: 31.8 % — ABNORMAL LOW (ref 36.0–46.0)
Hemoglobin: 10.6 g/dL — ABNORMAL LOW (ref 12.0–15.0)
MCH: 34.2 pg — ABNORMAL HIGH (ref 26.0–34.0)
MCHC: 33.3 g/dL (ref 30.0–36.0)
MCV: 102.6 fL — ABNORMAL HIGH (ref 80.0–100.0)
Platelets: 148 10*3/uL — ABNORMAL LOW (ref 150–400)
RBC: 3.1 MIL/uL — ABNORMAL LOW (ref 3.87–5.11)
RDW: 13.6 % (ref 11.5–15.5)
WBC: 13.1 10*3/uL — ABNORMAL HIGH (ref 4.0–10.5)
nRBC: 0 % (ref 0.0–0.2)

## 2019-10-29 LAB — GLUCOSE, CAPILLARY
Glucose-Capillary: 179 mg/dL — ABNORMAL HIGH (ref 70–99)
Glucose-Capillary: 286 mg/dL — ABNORMAL HIGH (ref 70–99)
Glucose-Capillary: 345 mg/dL — ABNORMAL HIGH (ref 70–99)
Glucose-Capillary: 238 mg/dL — ABNORMAL HIGH (ref 70–99)

## 2019-10-29 LAB — BASIC METABOLIC PANEL
Anion gap: 7 (ref 5–15)
BUN: 18 mg/dL (ref 8–23)
CO2: 19 mmol/L — ABNORMAL LOW (ref 22–32)
Calcium: 7.6 mg/dL — ABNORMAL LOW (ref 8.9–10.3)
Chloride: 105 mmol/L (ref 98–111)
Creatinine, Ser: 1.11 mg/dL — ABNORMAL HIGH (ref 0.44–1.00)
GFR calc Af Amer: 60 mL/min — ABNORMAL LOW (ref >60)
GFR calc non Af Amer: 52 mL/min — ABNORMAL LOW (ref >60)
Glucose, Bld: 203 mg/dL — ABNORMAL HIGH (ref 70–99)
Potassium: 3.6 mmol/L (ref 3.5–5.1)
Sodium: 131 mmol/L — ABNORMAL LOW (ref 135–145)

## 2019-10-29 MED ORDER — INSULIN ASPART 100 UNIT/ML ~~LOC~~ SOLN
0.0000 [IU] | Freq: Every day | SUBCUTANEOUS | Status: DC
  Administered 2019-10-29 – 2019-10-30 (×2): 2 [IU] via SUBCUTANEOUS

## 2019-10-29 MED ORDER — INSULIN ASPART 100 UNIT/ML ~~LOC~~ SOLN
0.0000 [IU] | Freq: Three times a day (TID) | SUBCUTANEOUS | Status: DC
  Administered 2019-10-29: 11 [IU] via SUBCUTANEOUS
  Administered 2019-10-30: 5 [IU] via SUBCUTANEOUS
  Administered 2019-10-30 (×2): 3 [IU] via SUBCUTANEOUS
  Administered 2019-10-31: 2 [IU] via SUBCUTANEOUS
  Administered 2019-10-31 (×2): 5 [IU] via SUBCUTANEOUS
  Administered 2019-11-01: 2 [IU] via SUBCUTANEOUS
  Administered 2019-11-02 – 2019-11-03 (×2): 3 [IU] via SUBCUTANEOUS
  Administered 2019-11-04: 2 [IU] via SUBCUTANEOUS
  Administered 2019-11-04: 3 [IU] via SUBCUTANEOUS
  Administered 2019-11-05: 2 [IU] via SUBCUTANEOUS

## 2019-10-29 NOTE — Progress Notes (Signed)
Subjective: Patient seen and examined at bedside. She reports having 1 bowel movement today. I spoke with the nurse who reports 5-6 bowel movements today morning, stools are mushy and yellow, not watery or loose, there has been no blood, mucus or black stools. She is also tolerating diet orally.  Objective: Vital signs in last 24 hours: Temp:  [98.3 F (36.8 C)-99.4 F (37.4 C)] 98.3 F (36.8 C) (08/28 0936) Pulse Rate:  [66-92] 92 (08/28 0936) Resp:  [17-18] 18 (08/28 0936) BP: (110-138)/(47-60) 112/47 (08/28 0936) SpO2:  [93 %-98 %] 95 % (08/28 0936) Weight change:  Last BM Date: 10/29/19  PE: Elderly, frail, ill-appearing GENERAL: Mild pallor, no icterus ABDOMEN: Soft, nondistended, nontender, feeding tube in place, no surrounding tenderness or erythema EXTREMITIES: No deformity  Lab Results: Results for orders placed or performed during the hospital encounter of 10/21/19 (from the past 48 hour(s))  Glucose, capillary     Status: Abnormal   Collection Time: 10/27/19  4:57 PM  Result Value Ref Range   Glucose-Capillary 311 (H) 70 - 99 mg/dL    Comment: Glucose reference range applies only to samples taken after fasting for at least 8 hours.  Glucose, capillary     Status: Abnormal   Collection Time: 10/27/19  9:00 PM  Result Value Ref Range   Glucose-Capillary 194 (H) 70 - 99 mg/dL    Comment: Glucose reference range applies only to samples taken after fasting for at least 8 hours.  Hemoglobin A1c     Status: None   Collection Time: 10/28/19  4:37 AM  Result Value Ref Range   Hgb A1c MFr Bld 5.3 4.8 - 5.6 %    Comment: (NOTE) Pre diabetes:          5.7%-6.4%  Diabetes:              >6.4%  Glycemic control for   <7.0% adults with diabetes    Mean Plasma Glucose 105.41 mg/dL    Comment: Performed at [facility], [address], [place], [place] [address]  CBC     Status: Abnormal   Collection Time: 10/28/19  4:37 AM  Result Value Ref Range   WBC 8.2 4.0 -  10.5 K/uL   RBC 2.97 (L) 3.87 - 5.11 MIL/uL   Hemoglobin 10.0 (L) 12.0 - 15.0 g/dL   HCT 78.2 (L) 36 - 46 %   MCV 102.7 (H) 80.0 - 100.0 fL   MCH 33.7 26.0 - 34.0 pg   MCHC 32.8 30.0 - 36.0 g/dL   RDW 95.6 21.3 - 08.6 %   Platelets 123 (L) 150 - 400 K/uL    Comment: REPEATED TO VERIFY   nRBC 0.0 0.0 - 0.2 %    Comment: Performed at [facility], [address], [place], [place] [address]  Basic metabolic panel     Status: Abnormal   Collection Time: 10/28/19  4:37 AM  Result Value Ref Range   Sodium 133 (L) 135 - 145 mmol/L   Potassium 4.0 3.5 - 5.1 mmol/L   Chloride 107 98 - 111 mmol/L   CO2 22 22 - 32 mmol/L   Glucose, Bld 121 (H) 70 - 99 mg/dL    Comment: Glucose reference range applies only to samples taken after fasting for at least 8 hours.   BUN 18 8 - 23 mg/dL   Creatinine, Ser 9.62 (H) 0.44 - 1.00 mg/dL   Calcium 7.6 (L) 8.9 - 10.3 mg/dL   GFR  calc non Af Amer 58 (L) >60 mL/min   GFR calc Af Amer >60 >60 mL/min   Anion gap 4 (L) 5 - 15    Comment: Performed at [facility], [address], [place], [place] [address]  Glucose, capillary     Status: Abnormal   Collection Time: 10/28/19  6:46 AM  Result Value Ref Range   Glucose-Capillary 105 (H) 70 - 99 mg/dL    Comment: Glucose reference range applies only to samples taken after fasting for at least 8 hours.  Glucose, capillary     Status: Abnormal   Collection Time: 10/28/19 12:10 PM  Result Value Ref Range   Glucose-Capillary 148 (H) 70 - 99 mg/dL    Comment: Glucose reference range applies only to samples taken after fasting for at least 8 hours.  Glucose, capillary     Status: Abnormal   Collection Time: 10/28/19  4:55 PM  Result Value Ref Range   Glucose-Capillary 192 (H) 70 - 99 mg/dL    Comment: Glucose reference range applies only to samples taken after fasting for at least 8 hours.  Glucose, capillary     Status: Abnormal   Collection Time: 10/28/19  9:03 PM  Result Value Ref Range    Glucose-Capillary 186 (H) 70 - 99 mg/dL    Comment: Glucose reference range applies only to samples taken after fasting for at least 8 hours.  CBC     Status: Abnormal   Collection Time: 10/29/19  4:18 AM  Result Value Ref Range   WBC 13.1 (H) 4.0 - 10.5 K/uL   RBC 3.10 (L) 3.87 - 5.11 MIL/uL   Hemoglobin 10.6 (L) 12.0 - 15.0 g/dL   HCT 75.6 (L) 36 - 46 %   MCV 102.6 (H) 80.0 - 100.0 fL   MCH 34.2 (H) 26.0 - 34.0 pg   MCHC 33.3 30.0 - 36.0 g/dL   RDW 43.3 29.5 - 18.8 %   Platelets 148 (L) 150 - 400 K/uL   nRBC 0.0 0.0 - 0.2 %    Comment: Performed at [facility], [address] [place], [place] [address]  Basic metabolic panel     Status: Abnormal   Collection Time: 10/29/19  4:18 AM  Result Value Ref Range   Sodium 131 (L) 135 - 145 mmol/L   Potassium 3.6 3.5 - 5.1 mmol/L   Chloride 105 98 - 111 mmol/L   CO2 19 (L) 22 - 32 mmol/L   Glucose, Bld 203 (H) 70 - 99 mg/dL    Comment: Glucose reference range applies only to samples taken after fasting for at least 8 hours.   BUN 18 8 - 23 mg/dL   Creatinine, Ser 6.30 (H) 0.44 - 1.00 mg/dL   Calcium 7.6 (L) 8.9 - 10.3 mg/dL   GFR calc non Af Amer 52 (L) >60 mL/min   GFR calc Af Amer 60 (L) >60 mL/min   Anion gap 7 5 - 15    Comment: Performed at [facility], [address] [place], [place] [address]  Glucose, capillary     Status: Abnormal   Collection Time: 10/29/19  6:43 AM  Result Value Ref Range   Glucose-Capillary 179 (H) 70 - 99 mg/dL    Comment: Glucose reference range applies only to samples taken after fasting for at least 8 hours.  Glucose, capillary     Status: Abnormal   Collection Time: 10/29/19 11:40 AM  Result Value Ref Range  Glucose-Capillary 286 (H) 70 - 99 mg/dL    Comment: Glucose reference range applies only to samples taken after fasting for at least 8 hours.    Studies/Results: IR GASTROSTOMY TUBE MOD SED  Result Date: 10/28/2019 INDICATION: Dysphagia, malnutrition EXAM: FLUOROSCOPIC 20  FRENCH PULL-THROUGH GASTROSTOMY Date:  10/28/2019 10/28/2019 11:54 am Radiologist:  M. Ruel Favors, MD Guidance:  Fluoroscopic MEDICATIONS: Ancef 2 g; Antibiotics were administered within 1 hour of the procedure. Glucagon 0.5 mg IV ANESTHESIA/SEDATION: Versed 2.0 mg IV; Fentanyl 100 mcg IV Moderate Sedation Time:  14 minutes The patient was continuously monitored during the procedure by the interventional radiology nurse under my direct supervision. CONTRAST:  10 cc-administered into the gastric lumen. FLUOROSCOPY TIME:  Fluoroscopy Time: 2 minutes 36 seconds (9 mGy). COMPLICATIONS: None immediate. PROCEDURE: Informed consent was obtained from the patient following explanation of the procedure, risks, benefits and alternatives. The patient understands, agrees and consents for the procedure. All questions were addressed. A time out was performed. Maximal barrier sterile technique utilized including caps, mask, sterile gowns, sterile gloves, large sterile drape, hand hygiene, and betadine prep. The left upper quadrant was sterilely prepped and draped. An oral gastric catheter was inserted into the stomach under fluoroscopy. The existing nasogastric feeding tube was removed. Air was injected into the stomach for insufflation and visualization under fluoroscopy. The air distended stomach was confirmed beneath the anterior abdominal wall in the frontal and lateral projections. Under sterile conditions and local anesthesia, a 17 gauge trocar needle was utilized to access the stomach percutaneously beneath the left subcostal margin. Needle position was confirmed within the stomach under biplane fluoroscopy. Contrast injection confirmed position also. A single T tack was deployed for gastropexy. Over an Amplatz guide wire, a 9-French sheath was inserted into the stomach. A snare device was utilized to capture the oral gastric catheter. The snare device was pulled retrograde from the stomach up the esophagus and out the  oropharynx. The 20-French pull-through gastrostomy was connected to the snare device and pulled antegrade through the oropharynx down the esophagus into the stomach and then through the percutaneous tract external to the patient. The gastrostomy was assembled externally. Contrast injection confirms position in the stomach. Images were obtained for documentation. The patient tolerated procedure well. No immediate complication. IMPRESSION: Fluoroscopic insertion of a 20-French "pull-through" gastrostomy. Electronically Signed   By: Judie Petit.  Shick M.D.   On: 10/28/2019 11:57    Medications: I have reviewed the patient's current medications.  Assessment: Status post gastrostomy tube placement on 10/28/2019-tolerating tube feeds well As per her nurse she finished 100% of her dysphagia level 1 diet in the morning and is now eating her lunch Multiple bowel movements, C. difficile negative on 10/22/2019, GI pathogen panel negative on 10/22/2019 Diffuse pancolonic erythematous mural thickening and mucosal hyperemia with pericolonic hazy stranding noted on CAT scan from 10/21/2019 with fluid-filled rectum and hemorrhoidal enhancement, inflammatory or infectious colitis with rapid transit state  Multiple comorbidities-history of COPD, chronic diastolic CHF, coronary artery disease/PCI and stents, hypertension, dyslipidemia  Plan: Clinically patient does not appear to have colitis, however has multiple bowel movements which may be worsened with tube feedings. Agree with keeping patient on loperamide on an as needed basis Protein calorie malnutrition, weight loss, failure to thrive-plan continue tube feeding as well as oral diet No plans for endoscopic intervention at this point. Recommend observation We will sign off, please recall GI if needed.  Kerin Salen, MD 10/29/2019, 12:26 PM

## 2019-10-29 NOTE — Progress Notes (Signed)
Referring Physician(s): * No referring provider recorded for this case *  Supervising Physician: Richarda Overlie  Patient Status:  [facility] - In-pt  Chief Complaint: Malnutrition   Subjective: S/p G-tube placement in IR yesterday. Complains of expected tenderness. Disappointed she is not discharging today.  Allergies: Morphine and related and Other  Medications: Prior to Admission medications   Medication Sig Start Date End Date Taking? Authorizing Provider  citalopram (CELEXA) 20 MG tablet Take 1 tablet (20 mg total) by mouth daily. Patient taking differently: Take 20 mg by mouth at bedtime.  09/14/19  Yes Arvilla Market, DO  diclofenac Sodium (VOLTAREN) 1 % GEL Apply 4 g topically 4 (four) times daily. Patient taking differently: Apply 4 g topically 4 (four) times daily as needed (pain).  09/14/19  Yes Arvilla Market, DO  levothyroxine (EUTHYROX) 25 MCG tablet TAKE 1 TABLET BY MOUTH ONCE DAILY BEFORE BREAKFAST Patient taking differently: Take 25 mcg by mouth daily before breakfast.  09/14/19  Yes Arvilla Market, DO  losartan (COZAAR) 25 MG tablet Take 1 tablet (25 mg total) by mouth daily. Patient taking differently: Take 25 mg by mouth at bedtime.  09/14/19  Yes Arvilla Market, DO  metoCLOPramide (REGLAN) 10 MG tablet Take 1 tablet (10 mg total) by mouth 2 (two) times daily as needed for nausea. 09/14/19  Yes Arvilla Market, DO  nitroGLYCERIN (NITROSTAT) 0.4 MG SL tablet Place 0.4 mg under the tongue every 5 (five) minutes as needed for chest pain.   Yes [provider]  ondansetron (ZOFRAN ODT) 4 MG disintegrating tablet Take 1 tablet (4 mg total) by mouth every 8 (eight) hours as needed for nausea or vomiting. 09/14/19  Yes Arvilla Market, DO  pantoprazole (PROTONIX) 40 MG tablet Take 1 tablet (40 mg total) by mouth daily. 09/14/19  Yes Arvilla Market, DO  pregabalin (LYRICA) 75 MG capsule Take 1 capsule  (75 mg total) by mouth 2 (two) times daily. 09/14/19  Yes Arvilla Market, DO  topiramate (TOPAMAX) 25 MG tablet Take 2 tablets (50 mg total) by mouth daily. Patient taking differently: Take 25 mg by mouth 2 (two) times daily.  09/14/19  Yes Arvilla Market, DO  metoprolol succinate (TOPROL-XL) 25 MG 24 hr tablet Take 0.5 tablets (12.5 mg total) by mouth daily. Patient not taking: Reported on 10/21/2019 09/14/19   Arvilla Market, DO  rosuvastatin (CRESTOR) 5 MG tablet Take 1 tablet (5 mg total) by mouth daily. Patient not taking: Reported on 10/21/2019 08/23/19   Arvilla Market, DO     Vital Signs: BP (!) 121/45 (BP Location: Right Arm)   Pulse 87   Temp 99.6 F (37.6 C)   Resp 18   Ht 5\' 5"  (1.651 m)   Wt 108 lb 3.9 oz (49.1 kg)   SpO2 97%   BMI 18.01 kg/m   Physical Exam  NAD, alert Abdomen: soft, G-tube in place.  Insertion site c/d/i. No oozing or bleeding. TF infusing without issue.   Imaging: IR GASTROSTOMY TUBE MOD SED  Result Date: 10/28/2019 INDICATION: Dysphagia, malnutrition EXAM: FLUOROSCOPIC 20 FRENCH PULL-THROUGH GASTROSTOMY Date:  10/28/2019 10/28/2019 11:54 am Radiologist:  M. 10/30/2019, MD Guidance:  Fluoroscopic MEDICATIONS: Ancef 2 g; Antibiotics were administered within 1 hour of the procedure. Glucagon 0.5 mg IV ANESTHESIA/SEDATION: Versed 2.0 mg IV; Fentanyl 100 mcg IV Moderate Sedation Time:  14 minutes The patient was continuously monitored during the procedure by the interventional radiology nurse under  my direct supervision. CONTRAST:  10 cc-administered into the gastric lumen. FLUOROSCOPY TIME:  Fluoroscopy Time: 2 minutes 36 seconds (9 mGy). COMPLICATIONS: None immediate. PROCEDURE: Informed consent was obtained from the patient following explanation of the procedure, risks, benefits and alternatives. The patient understands, agrees and consents for the procedure. All questions were addressed. A time out was performed.  Maximal barrier sterile technique utilized including caps, mask, sterile gowns, sterile gloves, large sterile drape, hand hygiene, and betadine prep. The left upper quadrant was sterilely prepped and draped. An oral gastric catheter was inserted into the stomach under fluoroscopy. The existing nasogastric feeding tube was removed. Air was injected into the stomach for insufflation and visualization under fluoroscopy. The air distended stomach was confirmed beneath the anterior abdominal wall in the frontal and lateral projections. Under sterile conditions and local anesthesia, a 17 gauge trocar needle was utilized to access the stomach percutaneously beneath the left subcostal margin. Needle position was confirmed within the stomach under biplane fluoroscopy. Contrast injection confirmed position also. A single T tack was deployed for gastropexy. Over an Amplatz guide wire, a 9-French sheath was inserted into the stomach. A snare device was utilized to capture the oral gastric catheter. The snare device was pulled retrograde from the stomach up the esophagus and out the oropharynx. The 20-French pull-through gastrostomy was connected to the snare device and pulled antegrade through the oropharynx down the esophagus into the stomach and then through the percutaneous tract external to the patient. The gastrostomy was assembled externally. Contrast injection confirms position in the stomach. Images were obtained for documentation. The patient tolerated procedure well. No immediate complication. IMPRESSION: Fluoroscopic insertion of a 20-French "pull-through" gastrostomy. Electronically Signed   By: Judie Petit.  Shick M.D.   On: 10/28/2019 11:57    Labs:  CBC: Recent Labs    10/26/19 0724 10/27/19 0150 10/28/19 0437 10/29/19 0418  WBC 8.8 8.9 8.2 13.1*  HGB 10.2* 9.5* 10.0* 10.6*  HCT 30.1* 28.7* 30.5* 31.8*  PLT 104* 101* 123* 148*    COAGS: No results for input(s): INR, APTT in the last 8760  hours.  BMP: Recent Labs    10/26/19 0724 10/27/19 0150 10/28/19 0437 10/29/19 0418  NA 136 135 133* 131*  K 3.6 3.3* 4.0 3.6  CL 107 107 107 105  CO2 23 22 22  19*  GLUCOSE 112* 154* 121* 203*  BUN 16 16 18 18   CALCIUM 7.6* 7.4* 7.6* 7.6*  CREATININE 0.93 0.95 1.01* 1.11*  GFRNONAA >60 >60 58* 52*  GFRAA >60 >60 >60 60*    LIVER FUNCTION TESTS: Recent Labs    10/23/19 0815 10/24/19 0322 10/25/19 0632 10/26/19 0724  BILITOT 0.5 0.9 0.5 0.3  AST 8* 8* 8* 7*  ALT 6 6 7 7   ALKPHOS 56 52 42 38  PROT 5.1* 5.1* 4.7* 4.5*  ALBUMIN 2.2* 2.2* 2.1* 1.9*    Assessment and Plan: Moderate malnutrition related to chronic illness s/p G-tube placement with Dr. 10/27/19 10/28/19 G-tube in place today.  No issues noted.  Complains of expected tenderness.  TFs infusing.  Tube ready for use.  IR available if needed.  Electronically Signed: , PA 10/29/2019, 4:30 PM   I spent a total of 15 Minutes at the the patient's bedside AND on the patient's hospital floor or unit, greater than 50% of which was counseling/coordinating care for moderate malnutrition.

## 2019-10-29 NOTE — Progress Notes (Signed)
Progress Note    Bristyl Mclees  HQP:[id number] DOB: 1955-09-11  DOA: 10/21/2019 PCP: Arvilla Market, DO    Brief Narrative:    Medical records reviewed and are as summarized below:  Courtlynn Holloman is an 64 y.o. female *with history of COPD, tobacco abuse, chronic diastolic CHF, CAD/PCI and stents, hypertension, dyslipidemia presented to the emergency room 8/20 with worsening weakness, nausea, dysphagia and odynophagia, poor oral intake for over 2 months, this has been slowly progressive.  She has lost about 25 pounds, denies any fevers or chills, continues to smoke a pack of cigarettes a day. -Work-up in the ED noted mild hypotension but improved with saline, severe hypokalemia CT chest concerning for interstitial lung disease and emphysema, CT abdomen suggestive of esophagitis and infectious/inflammatory colitis -[facility] following , EGD was unremarkable, biopsy positive for Candida -Barium esophagram/evaluation for dysmotility was limited  Assessment/Plan:   Principal Problem:   Esophagitis Active Problems:   Dysphagia   Unintentional weight loss   Colitis   Hypokalemia   Malnutrition of moderate degree   Dysphagia/Nausea Severe protein calorie malnutrition/weight loss Adult failure to thrive -History of dysphagia and nausea progressively worsening for months, no overt findings concerning for malignancy based on CT chest/abdomen pelvis [facility] gastroenterology consulting, endoscopy 8/21 was unremarkable, follow-up biopsy, Ba esophagram w/out overt findings, limited evaluation of dysmotility due to patient's nausea and inability to stand -candida noted on path-starting Fluconazole -MBS: Dysphagia, pharyngoesophageal phase: dys 1 thin liquids -TSH and random cortisol within normal limits -started marinol, scheduled zofran -G tube placed for further nutritional support- tube feeds being started- titrate to goal  Chronic diarrhea -CT abdomen with  questionable mild colitis, clinically do not suspect active colitis -Antibiotics discontinued, could be malabsorption related -C. difficile PCR and GI pathogen panel are negative -Continue Imodium as needed after loose stool  Hypokalemia Hypomagnesemia -Replaced  leukocytosis -? 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 (Active)  10/22/19 0010  Location: Hip  Location Orientation: Left  Staging: Unstageable - Full thickness tissue loss in which the base of the injury is covered by slough (yellow, tan, gray, green or brown) and/or eschar (tan, brown or black) in the wound bed.  Wound Description (Comments):   Present on Admission: yes   Nutrition Status: Nutrition Problem: Moderate Malnutrition Etiology: chronic illness (dysphagia, esophagitis) Signs/Symptoms: mild fat depletion, mild muscle depletion, energy intake < or equal to 75% for > or equal to 1 month Interventions: Tube feeding    Family Communication/Anticipated D/C date and plan/Code Status   DVT prophylaxis: Lovenox ordered. Code Status: Full Code.  Called son- left message 8/26 Disposition Plan: Status is: Inpatient  Remains inpatient appropriate because:Inpatient level of care appropriate due to severity of illness   Dispo: The patient is from: Home              Anticipated d/c is to: Home              Anticipated  d/c date is: 24 hours               Patient currently is not medically stable to d/c. needs tube feeds at goal         Medical Consultants:    GI  IR  Subjective:   anxious to go home Says her roommate would help her with meals if she asked  Objective:    Vitals:   10/28/19 1656 10/28/19 2044 10/29/19 0438 10/29/19 0936  BP: (!) 138/51 110/60 (!) 131/47 (!) 112/47  Pulse: 66 72 81 92  Resp: Temp: 99.4 F (37.4 C) 99 F (37.2 C) 99.2 F (37.3 C) 98.3 F (36.8 C)  TempSrc: Oral Oral Oral Oral  SpO2: 98% 94% 93% 95%  Weight:      Height:        Intake/Output Summary (Last 24 hours) at 10/29/2019 1424 Last data filed at 10/29/2019 1256 Gross per 24 hour  Intake 954 ml  Output 250 ml  Net 704 ml   Filed Weights   10/24/19 1000 10/24/19 1938  Weight: 48.4 kg 49.1 kg    Exam:    General: Appearance:    Cachectic female in no acute distress     Lungs:     Poor effort but ? rales on right side  Heart:    Normal heart rate. Normal rhythm. No murmurs, rubs, or gallops.   MS:   All extremities are intact.   Neurologic:   Awake, alert   Data Reviewed:   I have personally reviewed following labs and imaging studies:  Labs: Labs show the following:   Basic Metabolic Panel: Recent Labs  Lab 10/23/19 0815 10/24/19 0322 10/25/19 1610 10/25/19 9604 10/26/19 0724 10/26/19 0724 10/27/19 0150 10/27/19 0150 10/28/19 0437 10/29/19 0418  NA 136   < > 135  --  136  --  135  --  133* 131*  K 3.2*   < > 3.3*   < > 3.6   < > 3.3*   < > 4.0 3.6  CL 107   < > 106  --  107  --  107  --  107 105  CO2 17*   < > 22  --  23  --  22  --  22 19*  GLUCOSE 75   < > 131*  --  112*  --  154*  --  121* 203*  BUN 10   < > 14  --  16  --  16  --  18 18  CREATININE 1.07*   < > 1.07*  --  0.93  --  0.95  --  1.01* 1.11*  CALCIUM 7.5*   < > 8.0*  --  7.6*  --  7.4*  --  7.6* 7.6*  MG 2.0  --   --   --   --   --  1.8  --   --   --   PHOS  --   --   --   --   --   --  2.6  --   --   --    < > =  values in this interval not displayed.   GFR Estimated Creatinine Clearance: 38.6 mL/min (A) (by C-G formula based on SCr of 1.11 mg/dL (H)). Liver Function Tests: Recent Labs  Lab 10/23/19 0815 10/24/19 0322 10/25/19 0632 10/26/19 0724  AST 8* 8* 8* 7*  ALT ALKPHOS  56 52 42 38  BILITOT 0.5 0.9 0.5 0.3  PROT 5.1* 5.1* 4.7* 4.5*  ALBUMIN 2.2* 2.2* 2.1* 1.9*   No results for input(s): LIPASE, AMYLASE in the last 168 hours. No results for input(s): AMMONIA in the last 168 hours. Coagulation profile No results for input(s): INR, PROTIME in the last 168 hours.  CBC: Recent Labs  Lab 10/25/19 0632 10/26/19 0724 10/27/19 0150 10/28/19 0437 10/29/19 0418  WBC 10.2 8.8 8.9 8.2 13.1*  HGB 10.6* 10.2* 9.5* 10.0* 10.6*  HCT 31.3* 30.1* 28.7* 30.5* 31.8*  MCV 101.3* 101.7* 101.1* 102.7* 102.6*  PLT 128* 104* 101* 123* 148*   Cardiac Enzymes: No results for input(s): CKTOTAL, CKMB, CKMBINDEX, TROPONINI in the last 168 hours. BNP (last 3 results) No results for input(s): PROBNP in the last 8760 hours. CBG: Recent Labs  Lab 10/28/19 1210 10/28/19 1655 10/28/19 2103 10/29/19 0643 10/29/19 1140  GLUCAP 148* 192* 186* 179* 286*   D-Dimer: No results for input(s): DDIMER in the last 72 hours. Hgb A1c: Recent Labs    10/28/19 0437  HGBA1C 5.3   Lipid Profile: No results for input(s): CHOL, HDL, LDLCALC, TRIG, CHOLHDL, LDLDIRECT in the last 72 hours. Thyroid function studies: No results for input(s): TSH, T4TOTAL, T3FREE, THYROIDAB in the last 72 hours.  Invalid input(s): FREET3 Anemia work up: No results for input(s): VITAMINB12, FOLATE, FERRITIN, TIBC, IRON, RETICCTPCT in the last 72 hours. Sepsis Labs: Recent Labs  Lab 10/26/19 0724 10/27/19 0150 10/28/19 0437 10/29/19 0418  WBC 8.8 8.9 8.2 13.1*    Microbiology Recent Results (from the past 240 hour(s))  SARS Coronavirus 2 by RT PCR (hospital order, performed in [facility] hospital lab)  Nasopharyngeal Nasopharyngeal Swab     Status: None   Collection Time: 10/21/19  5:01 PM   Specimen: Nasopharyngeal Swab  Result Value Ref Range Status   SARS Coronavirus 2 NEGATIVE NEGATIVE Final    Comment: (NOTE) SARS-CoV-2 target nucleic acids are NOT DETECTED.  The SARS-CoV-2 RNA is generally detectable in upper and lower respiratory specimens during the acute phase of infection. The lowest concentration of SARS-CoV-2 viral copies this assay can detect is 250 copies / mL. A negative result does not preclude SARS-CoV-2 infection and should not be used as the sole basis for treatment or other patient management decisions.  A negative result may occur with improper specimen collection / handling, submission of specimen other than nasopharyngeal swab, presence of viral mutation(s) within the areas targeted by this assay, and inadequate number of viral copies (<250 copies / mL). A negative result must be combined with clinical observations, patient history, and epidemiological information.  Fact Sheet for Patients:   BoilerBrush.com.cy  Fact Sheet for Healthcare Providers: https://pope.com/  This test is not yet approved or  cleared by the [place] FDA and has been authorized for detection and/or diagnosis of SARS-CoV-2 by FDA under an Emergency Use Authorization (EUA).  This EUA will remain in effect (meaning this test can be used) for the duration of the COVID-19 declaration under Section 564(b)(1) of the Act, 21 U.S.C. section 360bbb-3(b)(1), unless the authorization is terminated or revoked sooner.  Performed at [facility], [address], [place], [place] [address]   Culture, blood (routine x 2)     Status: None   Collection Time: 10/22/19 12:41 AM   Specimen: BLOOD  Result Value Ref Range Status   Specimen Description BLOOD LEFT ANTECUBITAL  Final   Special Requests   Final  BOTTLES DRAWN AEROBIC AND  ANAEROBIC Blood Culture adequate volume   Culture   Final    NO GROWTH 5 DAYS Performed at [facility], [address] [place], [place] [address]    Report Status 10/27/2019 FINAL  Final  Culture, blood (routine x 2)     Status: None   Collection Time: 10/22/19 12:47 AM   Specimen: BLOOD RIGHT HAND  Result Value Ref Range Status   Specimen Description BLOOD RIGHT HAND  Final   Special Requests   Final    BOTTLES DRAWN AEROBIC ONLY Blood Culture adequate volume   Culture   Final    NO GROWTH 5 DAYS Performed at [facility], [address], [place], [place] [address]    Report Status 10/27/2019 FINAL  Final  Gastrointestinal Panel by PCR , Stool     Status: None   Collection Time: 10/22/19  4:45 PM   Specimen: STOOL  Result Value Ref Range Status   Campylobacter species NOT DETECTED NOT DETECTED Final   Plesimonas shigelloides NOT DETECTED NOT DETECTED Final   Salmonella species NOT DETECTED NOT DETECTED Final   Yersinia enterocolitica NOT DETECTED NOT DETECTED Final   Vibrio species NOT DETECTED NOT DETECTED Final   Vibrio cholerae NOT DETECTED NOT DETECTED Final   Enteroaggregative E coli (EAEC) NOT DETECTED NOT DETECTED Final   Enteropathogenic E coli (EPEC) NOT DETECTED NOT DETECTED Final   Enterotoxigenic E coli (ETEC) NOT DETECTED NOT DETECTED Final   Shiga like toxin producing E coli (STEC) NOT DETECTED NOT DETECTED Final   Shigella/Enteroinvasive E coli (EIEC) NOT DETECTED NOT DETECTED Final   Cryptosporidium NOT DETECTED NOT DETECTED Final   Cyclospora cayetanensis NOT DETECTED NOT DETECTED Final   Entamoeba histolytica NOT DETECTED NOT DETECTED Final   Giardia lamblia NOT DETECTED NOT DETECTED Final   Adenovirus F40/41 NOT DETECTED NOT DETECTED Final   Astrovirus NOT DETECTED NOT DETECTED Final   Norovirus GI/GII NOT DETECTED NOT DETECTED Final   Rotavirus A NOT DETECTED NOT DETECTED Final   Sapovirus (I, II, IV, and V) NOT DETECTED NOT DETECTED Final     Comment: Performed at [facility], [address] [place], [place] [address]  C Difficile Quick Screen w PCR reflex     Status: None   Collection Time: 10/22/19  4:45 PM   Specimen: STOOL  Result Value Ref Range Status   C Diff antigen NEGATIVE NEGATIVE Final   C Diff toxin NEGATIVE NEGATIVE Final   C Diff interpretation No C. difficile detected.  Final    Comment: Performed at [facility], [address], [place], [place] [address]    Procedures and diagnostic studies:  IR GASTROSTOMY TUBE MOD SED  Result Date: 10/28/2019 INDICATION: Dysphagia, malnutrition EXAM: FLUOROSCOPIC 20 FRENCH PULL-THROUGH GASTROSTOMY Date:  10/28/2019 10/28/2019 11:54 am Radiologist:  M. Ruel Favors, MD Guidance:  Fluoroscopic MEDICATIONS: Ancef 2 g; Antibiotics were administered within 1 hour of the procedure. Glucagon 0.5 mg IV ANESTHESIA/SEDATION: Versed 2.0 mg IV; Fentanyl 100 mcg IV Moderate Sedation Time:  14 minutes The patient was continuously monitored during the procedure by the interventional radiology nurse under my direct supervision. CONTRAST:  10 cc-administered into the gastric lumen. FLUOROSCOPY TIME:  Fluoroscopy Time: 2 minutes 36 seconds (9 mGy). COMPLICATIONS: None immediate. PROCEDURE: Informed consent was obtained from the patient following explanation of the procedure, risks, benefits and alternatives. The patient understands, agrees and consents for the procedure. All questions were addressed. A  time out was performed. Maximal barrier sterile technique utilized including caps, mask, sterile gowns, sterile gloves, large sterile drape, hand hygiene, and betadine prep. The left upper quadrant was sterilely prepped and draped. An oral gastric catheter was inserted into the stomach under fluoroscopy. The existing nasogastric feeding tube was removed. Air was injected into the stomach for insufflation and visualization under fluoroscopy. The air distended stomach was confirmed  beneath the anterior abdominal wall in the frontal and lateral projections. Under sterile conditions and local anesthesia, a 17 gauge trocar needle was utilized to access the stomach percutaneously beneath the left subcostal margin. Needle position was confirmed within the stomach under biplane fluoroscopy. Contrast injection confirmed position also. A single T tack was deployed for gastropexy. Over an Amplatz guide wire, a 9-French sheath was inserted into the stomach. A snare device was utilized to capture the oral gastric catheter. The snare device was pulled retrograde from the stomach up the esophagus and out the oropharynx. The 20-French pull-through gastrostomy was connected to the snare device and pulled antegrade through the oropharynx down the esophagus into the stomach and then through the percutaneous tract external to the patient. The gastrostomy was assembled externally. Contrast injection confirms position in the stomach. Images were obtained for documentation. The patient tolerated procedure well. No immediate complication. IMPRESSION: Fluoroscopic insertion of a 20-French "pull-through" gastrostomy. Electronically Signed   By: Judie Petit.  Shick M.D.   On: 10/28/2019 11:57    Medications:   . citalopram  20 mg Oral QHS  . collagenase   Topical Daily  . diclofenac Sodium  4 g Topical QID  . dronabinol  2.5 mg Oral BID AC  . enoxaparin (LOVENOX) injection  40 mg Subcutaneous Q24H  . feeding supplement  1 Container Oral TID BM  . feeding supplement (OSMOLITE 1.5 CAL)  1,000 mL Per Tube Q24H  . feeding supplement (PROSource TF)  45 mL Per Tube Daily  . fluconazole  100 mg Oral Daily  . insulin aspart  0-5 Units Subcutaneous QHS  . insulin aspart  0-6 Units Subcutaneous TID WC  . levothyroxine  25 mcg Oral Q0600  . nicotine  21 mg Transdermal Daily  . ondansetron (ZOFRAN) IV  4 mg Intravenous TID AC  . pantoprazole  40 mg Oral Q1200  . pregabalin  75 mg Oral BID  . topiramate  25 mg Oral BID    Continuous Infusions:    LOS: 7 days   Joseph Art  [facility]   How to contact the [facility] Attending or Consulting provider 7A - 7P or covering provider during after hours 7P -7A, for this patient?  1. Check the care team in [facility] and look for a) attending/consulting [facility] provider listed and b) the [facility] team listed 2. Log into [email or web address] and use [facility]'s universal password to access. If you do not have the password, please contact the hospital operator. 3. Locate the [facility] provider you are looking for under [facility] and page to a number that you can be directly reached. 4. If you still have difficulty reaching the provider, please page the [facility] (Director on Call) for the Hospitalists listed on amion for assistance.  10/29/2019, 2:24 PM

## 2019-10-30 ENCOUNTER — Inpatient Hospital Stay (HOSPITAL_COMMUNITY): Payer: Medicare HMO

## 2019-10-30 LAB — BASIC METABOLIC PANEL
Anion gap: 7 (ref 5–15)
BUN: 20 mg/dL (ref 8–23)
CO2: 19 mmol/L — ABNORMAL LOW (ref 22–32)
Calcium: 7.5 mg/dL — ABNORMAL LOW (ref 8.9–10.3)
Chloride: 103 mmol/L (ref 98–111)
Creatinine, Ser: 1.07 mg/dL — ABNORMAL HIGH (ref 0.44–1.00)
GFR calc Af Amer: >60 mL/min (ref >60)
GFR calc non Af Amer: 54 mL/min — ABNORMAL LOW (ref >60)
Glucose, Bld: 189 mg/dL — ABNORMAL HIGH (ref 70–99)
Potassium: 2.9 mmol/L — ABNORMAL LOW (ref 3.5–5.1)
Sodium: 129 mmol/L — ABNORMAL LOW (ref 135–145)

## 2019-10-30 LAB — CBC
HCT: 24.8 % — ABNORMAL LOW (ref 36.0–46.0)
Hemoglobin: 8.4 g/dL — ABNORMAL LOW (ref 12.0–15.0)
MCH: 34.6 pg — ABNORMAL HIGH (ref 26.0–34.0)
MCHC: 33.9 g/dL (ref 30.0–36.0)
MCV: 102.1 fL — ABNORMAL HIGH (ref 80.0–100.0)
Platelets: 149 10*3/uL — ABNORMAL LOW (ref 150–400)
RBC: 2.43 MIL/uL — ABNORMAL LOW (ref 3.87–5.11)
RDW: 13.6 % (ref 11.5–15.5)
WBC: 13.9 10*3/uL — ABNORMAL HIGH (ref 4.0–10.5)
nRBC: 0 % (ref 0.0–0.2)

## 2019-10-30 LAB — GLUCOSE, CAPILLARY
Glucose-Capillary: 158 mg/dL — ABNORMAL HIGH (ref 70–99)
Glucose-Capillary: 175 mg/dL — ABNORMAL HIGH (ref 70–99)
Glucose-Capillary: 205 mg/dL — ABNORMAL HIGH (ref 70–99)

## 2019-10-30 MED ORDER — MAGNESIUM SULFATE 2 GM/50ML IV SOLN
2.0000 g | Freq: Once | INTRAVENOUS | Status: AC
  Administered 2019-10-30: 2 g via INTRAVENOUS
  Filled 2019-10-30: qty 50

## 2019-10-30 MED ORDER — OSMOLITE 1.5 CAL PO LIQD
1000.0000 mL | ORAL | Status: DC
  Administered 2019-10-30 – 2019-10-31 (×2): 1000 mL
  Filled 2019-10-30 (×2): qty 1000

## 2019-10-30 MED ORDER — ALBUMIN HUMAN 25 % IV SOLN
25.0000 g | Freq: Once | INTRAVENOUS | Status: AC
  Administered 2019-10-30: 25 g via INTRAVENOUS
  Filled 2019-10-30: qty 100

## 2019-10-30 MED ORDER — SODIUM CHLORIDE 0.9 % IV SOLN
500.0000 mg | INTRAVENOUS | Status: DC
  Administered 2019-10-30 – 2019-10-31 (×2): 500 mg via INTRAVENOUS
  Filled 2019-10-30 (×3): qty 500

## 2019-10-30 MED ORDER — POTASSIUM CHLORIDE 20 MEQ PO PACK
40.0000 meq | PACK | Freq: Four times a day (QID) | ORAL | Status: AC
  Administered 2019-10-30 (×4): 40 meq via ORAL
  Filled 2019-10-30 (×4): qty 2

## 2019-10-30 MED ORDER — SODIUM CHLORIDE 0.9 % IV SOLN
2.0000 g | INTRAVENOUS | Status: AC
  Administered 2019-10-30 – 2019-11-05 (×7): 2 g via INTRAVENOUS
  Filled 2019-10-30 (×7): qty 20

## 2019-10-30 MED ORDER — FUROSEMIDE 20 MG PO TABS
20.0000 mg | ORAL_TABLET | Freq: Once | ORAL | Status: AC
  Administered 2019-10-30: 20 mg via ORAL
  Filled 2019-10-30: qty 1

## 2019-10-30 NOTE — Plan of Care (Signed)
Up in chair with 2 person assist.

## 2019-10-30 NOTE — Progress Notes (Signed)
BP is 88/32 manually, RN messaged MD on call.   Larey Days, RN

## 2019-10-30 NOTE — Progress Notes (Signed)
Progress Note    Kelly [name]  BPZ:[id number] DOB: [birth year]  DOA: 10/21/2019 PCP: Arvilla Market, DO    Brief Narrative:    Medical records reviewed and are as summarized below:  Kelly [name] is an 64 y.o. female *with history of COPD, tobacco abuse, chronic diastolic CHF, CAD/PCI and stents, hypertension, dyslipidemia presented to the emergency room 8/20 with worsening weakness, nausea, dysphagia and odynophagia, poor oral intake for over 2 months, this has been slowly progressive.  She has lost about 25 pounds, denies any fevers or chills, continues to smoke a pack of cigarettes a day. -Work-up in the ED noted mild hypotension but improved with saline, severe hypokalemia CT chest concerning for interstitial lung disease and emphysema, CT abdomen suggestive of esophagitis and infectious/inflammatory colitis -[facility] following , EGD was unremarkable, biopsy positive for Candida -Barium esophagram/evaluation for dysmotility was limited  Assessment/Plan:   Principal Problem:   Esophagitis Active Problems:   Dysphagia   Unintentional weight loss   Colitis   Hypokalemia   Malnutrition of moderate degree   Dysphagia/Nausea Severe protein calorie malnutrition/weight loss Adult failure to thrive -History of dysphagia and nausea progressively worsening for months, no overt findings concerning for malignancy based on CT chest/abdomen pelvis [facility] gastroenterology consulting, endoscopy 8/21 was unremarkable, follow-up biopsy, Ba esophagram w/out overt findings, limited evaluation of dysmotility due to patient's nausea and inability to stand -candida noted on path-starting Fluconazole -MBS: Dysphagia, pharyngoesophageal phase: dys 1 thin liquids -TSH and random cortisol within normal limits -started marinol, scheduled zofran -G tube placed for further nutritional support- tube feeds being started- titrated to goal  Hyponatremia -? Flushes vs too much water  intake vs volume overload -recheck at 4 pm  aspiration pna/leukocytosis -left side (yesterday patient was eating on left side so not suprising) -IV abx short term  Chronic diarrhea -CT abdomen with questionable mild colitis, clinically do not suspect active colitis -Antibiotics discontinued, could be malabsorption related -C. 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 (Active)  10/22/19 0010  Location: Hip  Location Orientation: Left  Staging: Unstageable - Full thickness tissue loss in which the base of the injury is covered by slough (yellow, tan, gray, green or brown) and/or eschar (tan, brown or black) in the wound bed.  Wound Description (Comments):   Present on Admission: yes   Nutrition Status: Nutrition Problem: Moderate Malnutrition Etiology: chronic illness (dysphagia, esophagitis) Signs/Symptoms: mild fat depletion, mild muscle depletion, energy intake < or equal to 75% for > or equal to 1 month Interventions: Tube feeding    Family Communication/Anticipated D/C date and plan/Code Status   DVT prophylaxis: Lovenox ordered. Code Status: Full Code.  Called son- left message 8/26 Disposition Plan: Status is:  Inpatient  Remains inpatient appropriate because:Inpatient level of care appropriate due to severity of illness  Dispo: The patient is from: Home              Anticipated d/c is to: Home              Anticipated d/c date is: in AM              Patient currently is not medically stable to d/c. aggressive replacement of electrolytes         Medical Consultants:    GI  IR  Subjective:   No current complaints except wanting to go home  Objective:    Vitals:   10/29/19 1628 10/29/19 2051 10/30/19 0444 10/30/19 0914  BP: (!) 121/45 (!) 112/42 (!) 96/28 (!) 96/43  Pulse: 87 86 72 80  Resp: Temp: 99.6 F (37.6 C) 100.2 F (37.9 C) 98.2 F (36.8 C) 98.9 F (37.2 C)  TempSrc:  Oral  Oral  SpO2: 97% 96% 99% 98%  Weight:      Height:        Intake/Output Summary (Last 24 hours) at 10/30/2019 1222 Last data filed at 10/30/2019 1610 Gross per 24 hour  Intake 600 ml  Output 27 ml  Net 573 ml   Filed Weights   10/24/19 1000 10/24/19 1938  Weight: 48.4 kg 49.1 kg    Exam:    General: Appearance:    Cachectic female in no acute distress- flat affect, poor eye contact, withdrawn     Lungs:     respirations unlabored  Heart:    Normal heart rate. Normal rhythm. No murmurs, rubs, or gallops.   MS:   All extremities are intact.   Neurologic:    No apparent focal neurological defect, poor insight     Data Reviewed:   I have personally reviewed following labs and imaging studies:  Labs: Labs show the following:   Basic Metabolic Panel: Recent Labs  Lab 10/26/19 0724 10/26/19 0724 10/27/19 0150 10/27/19 0150 10/28/19 0437 10/28/19 0437 10/29/19 0418 10/30/19 0201  NA 136  --  135  --  133*  --  131* 129*  K 3.6   < > 3.3*   < > 4.0   < > 3.6 2.9*  CL 107  --  107  --  107  --  105 103  CO2 23  --  22  --  22  --  19* 19*  GLUCOSE 112*  --  154*  --  121*  --  203* 189*  BUN 16  --  16  --  18  --  18 20  CREATININE 0.93  --  0.95  --   1.01*  --  1.11* 1.07*  CALCIUM 7.6*  --  7.4*  --  7.6*  --  7.6* 7.5*  MG  --   --  1.8  --   --   --   --   --   PHOS  --   --  2.6  --   --   --   --   --    < > = values in this interval not displayed.   GFR Estimated Creatinine Clearance: 40.1 mL/min (A) (by C-G formula based on SCr of 1.07 mg/dL (H)). Liver Function Tests: Recent Labs  Lab 10/24/19 0322 10/25/19 0632 10/26/19 0724  AST 8* 8* 7*  ALT ALKPHOS 52 42 38  BILITOT 0.9 0.5 0.3  PROT 5.1* 4.7* 4.5*  ALBUMIN 2.2* 2.1* 1.9*  No results for input(s): LIPASE, AMYLASE in the last 168 hours. No results for input(s): AMMONIA in the last 168 hours. Coagulation profile No results for input(s): INR, PROTIME in the last 168 hours.  CBC: Recent Labs  Lab 10/26/19 0724 10/27/19 0150 10/28/19 0437 10/29/19 0418 10/30/19 0201  WBC 8.8 8.9 8.2 13.1* 13.9*  HGB 10.2* 9.5* 10.0* 10.6* 8.4*  HCT 30.1* 28.7* 30.5* 31.8* 24.8*  MCV 101.7* 101.1* 102.7* 102.6* 102.1*  PLT 104* 101* 123* 148* 149*   Cardiac Enzymes: No results for input(s): CKTOTAL, CKMB, CKMBINDEX, TROPONINI in the last 168 hours. BNP (last 3 results) No results for input(s): PROBNP in the last 8760 hours. CBG: Recent Labs  Lab 10/29/19 1140 10/29/19 1625 10/29/19 2052 10/30/19 0645 10/30/19 1146  GLUCAP 286* 345* 238* 158* 175*   D-Dimer: No results for input(s): DDIMER in the last 72 hours. Hgb A1c: Recent Labs    10/28/19 0437  HGBA1C 5.3   Lipid Profile: No results for input(s): CHOL, HDL, LDLCALC, TRIG, CHOLHDL, LDLDIRECT in the last 72 hours. Thyroid function studies: No results for input(s): TSH, T4TOTAL, T3FREE, THYROIDAB in the last 72 hours.  Invalid input(s): FREET3 Anemia work up: No results for input(s): VITAMINB12, FOLATE, FERRITIN, TIBC, IRON, RETICCTPCT in the last 72 hours. Sepsis Labs: Recent Labs  Lab 10/27/19 0150 10/28/19 0437 10/29/19 0418 10/30/19 0201  WBC 8.9 8.2 13.1* 13.9*     Microbiology Recent Results (from the past 240 hour(s))  SARS Coronavirus 2 by RT PCR (hospital order, performed in [facility] hospital lab) Nasopharyngeal Nasopharyngeal Swab     Status: None   Collection Time: 10/21/19  5:01 PM   Specimen: Nasopharyngeal Swab  Result Value Ref Range Status   SARS Coronavirus 2 NEGATIVE NEGATIVE Final    Comment: (NOTE) SARS-CoV-2 target nucleic acids are NOT DETECTED.  The SARS-CoV-2 RNA is generally detectable in upper and lower respiratory specimens during the acute phase of infection. The lowest concentration of SARS-CoV-2 viral copies this assay can detect is 250 copies / mL. A negative result does not preclude SARS-CoV-2 infection and should not be used as the sole basis for treatment or other patient management decisions.  A negative result may occur with improper specimen collection / handling, submission of specimen other than nasopharyngeal swab, presence of viral mutation(s) within the areas targeted by this assay, and inadequate number of viral copies (<250 copies / mL). A negative result must be combined with clinical observations, patient history, and epidemiological information.  Fact Sheet for Patients:   BoilerBrush.com.cy  Fact Sheet for Healthcare Providers: https://pope.com/  This test is not yet approved or  cleared by the [place] FDA and has been authorized for detection and/or diagnosis of SARS-CoV-2 by FDA under an Emergency Use Authorization (EUA).  This EUA will remain in effect (meaning this test can be used) for the duration of the COVID-19 declaration under Section 564(b)(1) of the Act, 21 U.S.C. section 360bbb-3(b)(1), unless the authorization is terminated or revoked sooner.  Performed at [facility], [address] [place], [place] [address]   Culture, blood (routine x 2)     Status: None   Collection Time: 10/22/19 12:41 AM   Specimen: BLOOD   Result Value Ref Range Status   Specimen Description BLOOD LEFT ANTECUBITAL  Final   Special Requests   Final    BOTTLES DRAWN AEROBIC AND ANAEROBIC Blood Culture adequate volume   Culture   Final    NO GROWTH 5 DAYS  Performed at [facility], [address], [place], [place] [address]    Report Status 10/27/2019 FINAL  Final  Culture, blood (routine x 2)     Status: None   Collection Time: 10/22/19 12:47 AM   Specimen: BLOOD RIGHT HAND  Result Value Ref Range Status   Specimen Description BLOOD RIGHT HAND  Final   Special Requests   Final    BOTTLES DRAWN AEROBIC ONLY Blood Culture adequate volume   Culture   Final    NO GROWTH 5 DAYS Performed at [facility], [address] [place], [place] [address]    Report Status 10/27/2019 FINAL  Final  Gastrointestinal Panel by PCR , Stool     Status: None   Collection Time: 10/22/19  4:45 PM   Specimen: STOOL  Result Value Ref Range Status   Campylobacter species NOT DETECTED NOT DETECTED Final   Plesimonas shigelloides NOT DETECTED NOT DETECTED Final   Salmonella species NOT DETECTED NOT DETECTED Final   Yersinia enterocolitica NOT DETECTED NOT DETECTED Final   Vibrio species NOT DETECTED NOT DETECTED Final   Vibrio cholerae NOT DETECTED NOT DETECTED Final   Enteroaggregative E coli (EAEC) NOT DETECTED NOT DETECTED Final   Enteropathogenic E coli (EPEC) NOT DETECTED NOT DETECTED Final   Enterotoxigenic E coli (ETEC) NOT DETECTED NOT DETECTED Final   Shiga like toxin producing E coli (STEC) NOT DETECTED NOT DETECTED Final   Shigella/Enteroinvasive E coli (EIEC) NOT DETECTED NOT DETECTED Final   Cryptosporidium NOT DETECTED NOT DETECTED Final   Cyclospora cayetanensis NOT DETECTED NOT DETECTED Final   Entamoeba histolytica NOT DETECTED NOT DETECTED Final   Giardia lamblia NOT DETECTED NOT DETECTED Final   Adenovirus F40/41 NOT DETECTED NOT DETECTED Final   Astrovirus NOT DETECTED NOT DETECTED Final   Norovirus GI/GII  NOT DETECTED NOT DETECTED Final   Rotavirus A NOT DETECTED NOT DETECTED Final   Sapovirus (I, II, IV, and V) NOT DETECTED NOT DETECTED Final    Comment: Performed at [facility], [address] [place], [place] [address]  C Difficile Quick Screen w PCR reflex     Status: None   Collection Time: 10/22/19  4:45 PM   Specimen: STOOL  Result Value Ref Range Status   C Diff antigen NEGATIVE NEGATIVE Final   C Diff toxin NEGATIVE NEGATIVE Final   C Diff interpretation No C. difficile detected.  Final    Comment: Performed at [facility], [address] [place], [place] [address]    Procedures and diagnostic studies:  DG CHEST PORT 1 VIEW  Result Date: 10/30/2019 CLINICAL DATA:  Aspiration and airway EXAM: PORTABLE CHEST 1 VIEW COMPARISON:  October 21, 2019 FINDINGS: The cardiomediastinal silhouette is stable. New infiltrate in the left mid lung and left retrocardiac region. Mild diffuse interstitial prominence. No pneumothorax. No other changes. IMPRESSION: 1. New infiltrate in the left mid lung and left retrocardiac region may represent aspiration or pneumonia. Recommend clinical correlation. 2. Minimal interstitial prominence suggests pulmonary venous congestion. 3. No other acute abnormalities. Electronically Signed   By: Gerome Sam III M.D   On: 10/30/2019 08:23    Medications:    citalopram  20 mg Oral QHS   collagenase   Topical Daily   diclofenac Sodium  4 g Topical QID   dronabinol  2.5 mg Oral BID AC   enoxaparin (LOVENOX) injection  40 mg Subcutaneous Q24H   feeding supplement (OSMOLITE 1.5 CAL)  1,000 mL  Per Tube Q24H   feeding supplement (PROSource TF)  45 mL Per Tube Daily   fluconazole  100 mg Oral Daily   furosemide  20 mg Oral Once   insulin aspart  0-15 Units Subcutaneous TID WC   insulin aspart  0-5 Units Subcutaneous QHS   levothyroxine  25 mcg Oral Q0600   nicotine  21 mg Transdermal Daily   ondansetron (ZOFRAN) IV  4 mg  Intravenous TID AC   pantoprazole  40 mg Oral Q1200   potassium chloride  40 mEq Oral QID   pregabalin  75 mg Oral BID   topiramate  25 mg Oral BID   Continuous Infusions:  azithromycin     cefTRIAXone (ROCEPHIN)  IV 2 g (10/30/19 0954)     LOS: 8 days   Joseph Art  [facility]   How to contact the [facility] Attending or Consulting provider 7A - 7P or covering provider during after hours 7P -7A, for this patient?  1. Check the care team in [facility] and look for a) attending/consulting [facility] provider listed and b) the [facility] team listed 2. Log into [email or web address] and use [facility]'s universal password to access. If you do not have the password, please contact the hospital operator. 3. Locate the [facility] provider you are looking for under [facility] and page to a number that you can be directly reached. 4. If you still have difficulty reaching the provider, please page the [facility] (Director on Call) for the Hospitalists listed on amion for assistance.  10/30/2019, 12:22 PM

## 2019-10-30 NOTE — Progress Notes (Signed)
Brief Nutrition Note  Consult received for enteral/tube feeding initiation and management.  -Osmolite 1.5 @ 20 ml/hr via PEG -Advance by 10 ml every 8 hours to goal rate of 50 ml/hr (1200 ml) -48ml Prosource TF daily  At goal, tube feeding regimen will provide 1840 kcals, 86 grams of protein, free water  Adult Enteral Nutrition Protocol initiated. See follow up from 8/26.   Admitting Dx: Trauma [T14.90XA] Dysphagia [R13.10]  Labs:  Recent Labs  Lab 10/27/19 0150 10/27/19 0150 10/28/19 0437 10/29/19 0418 10/30/19 0201  NA 135   < > 133* 131* 129*  K 3.3*   < > 4.0 3.6 2.9*  CL 107   < > 107 105 103  CO2 22   < > 22 19* 19*  BUN 16   < > 18 18 20   CREATININE 0.95   < > 1.01* 1.11* 1.07*  CALCIUM 7.4*   < > 7.6* 7.6* 7.5*  MG 1.8  --   --   --   --   PHOS 2.6  --   --   --   --   GLUCOSE 154*   < > 121* 203* 189*   < > = values in this interval not displayed.    RD, LDN Clinical Nutrition Pager listed in AMION

## 2019-10-31 LAB — BASIC METABOLIC PANEL
Anion gap: 6 (ref 5–15)
BUN: 27 mg/dL — ABNORMAL HIGH (ref 8–23)
CO2: 16 mmol/L — ABNORMAL LOW (ref 22–32)
Calcium: 7.7 mg/dL — ABNORMAL LOW (ref 8.9–10.3)
Chloride: 110 mmol/L (ref 98–111)
Creatinine, Ser: 1.4 mg/dL — ABNORMAL HIGH (ref 0.44–1.00)
GFR calc Af Amer: 45 mL/min — ABNORMAL LOW (ref >60)
GFR calc non Af Amer: 39 mL/min — ABNORMAL LOW (ref >60)
Glucose, Bld: 244 mg/dL — ABNORMAL HIGH (ref 70–99)
Potassium: 6.1 mmol/L — ABNORMAL HIGH (ref 3.5–5.1)
Sodium: 132 mmol/L — ABNORMAL LOW (ref 135–145)

## 2019-10-31 LAB — CBC
HCT: 24.5 % — ABNORMAL LOW (ref 36.0–46.0)
Hemoglobin: 7.8 g/dL — ABNORMAL LOW (ref 12.0–15.0)
MCH: 33.5 pg (ref 26.0–34.0)
MCHC: 31.8 g/dL (ref 30.0–36.0)
MCV: 105.2 fL — ABNORMAL HIGH (ref 80.0–100.0)
Platelets: 168 10*3/uL (ref 150–400)
RBC: 2.33 MIL/uL — ABNORMAL LOW (ref 3.87–5.11)
RDW: 14.3 % (ref 11.5–15.5)
WBC: 9 10*3/uL (ref 4.0–10.5)
nRBC: 0 % (ref 0.0–0.2)

## 2019-10-31 LAB — GLUCOSE, CAPILLARY
Glucose-Capillary: 240 mg/dL — ABNORMAL HIGH (ref 70–99)
Glucose-Capillary: 216 mg/dL — ABNORMAL HIGH (ref 70–99)
Glucose-Capillary: 150 mg/dL — ABNORMAL HIGH (ref 70–99)
Glucose-Capillary: 170 mg/dL — ABNORMAL HIGH (ref 70–99)

## 2019-10-31 LAB — MAGNESIUM: Magnesium: 2.6 mg/dL — ABNORMAL HIGH (ref 1.7–2.4)

## 2019-10-31 MED ORDER — INSULIN ASPART 100 UNIT/ML ~~LOC~~ SOLN
3.0000 [IU] | SUBCUTANEOUS | Status: DC
  Administered 2019-10-31: 3 [IU] via SUBCUTANEOUS

## 2019-10-31 MED ORDER — SODIUM ZIRCONIUM CYCLOSILICATE 5 G PO PACK
5.0000 g | PACK | Freq: Once | ORAL | Status: AC
  Administered 2019-10-31: 5 g via ORAL
  Filled 2019-10-31: qty 1

## 2019-10-31 MED ORDER — LOPERAMIDE HCL 2 MG PO CAPS
2.0000 mg | ORAL_CAPSULE | Freq: Two times a day (BID) | ORAL | Status: DC
  Administered 2019-10-31 – 2019-11-05 (×10): 2 mg via ORAL
  Filled 2019-10-31 (×11): qty 1

## 2019-10-31 MED ORDER — OSMOLITE 1.5 CAL PO LIQD
300.0000 mL | Freq: Four times a day (QID) | ORAL | Status: DC
  Administered 2019-10-31 – 2019-11-05 (×17): 300 mL
  Filled 2019-10-31 (×13): qty 474

## 2019-10-31 NOTE — Plan of Care (Signed)
  Problem: Elimination: Goal: Will not experience complications related to bowel motility Outcome: Progressing   Problem: Safety: Goal: Ability to remain free from injury will improve Outcome: Progressing   

## 2019-10-31 NOTE — Progress Notes (Signed)
Nutrition Follow-up  DOCUMENTATION CODES:   Non-severe (moderate) malnutrition in context of chronic illness  INTERVENTION:   Change to bolus TF regimen: Osmolite 1.5 300 ml QID (start with 100 ml, increase each feeding by 100 ml to goal of 300 ml QID) D/C Prosource TF  Provides 1800 kcal, 75 gm protein, 914 ml free water daily.  NUTRITION DIAGNOSIS:   Moderate Malnutrition related to chronic illness (dysphagia, esophagitis) as evidenced by mild fat depletion, mild muscle depletion, energy intake < or equal to 75% for > or equal to 1 month.  Ongoing   GOAL:   Patient will meet greater than or equal to 90% of their needs  Met with TF  MONITOR:   TF tolerance, Weight trends, Labs, I & O's  REASON FOR ASSESSMENT:   Malnutrition Screening Tool    ASSESSMENT:   Chronically ill extremely cachectic 64 year old female with history of COPD, tobacco abuse, chronic diastolic CHF, CAD/PCI and stents, hypertension, dyslipidemia presented to the emergency room 8/20 with worsening weakness, nausea, dysphagia and odynophagia, poor oral intake for over 2 months, this has been slowly progressive.  She has lost about 25 pounds, denies any fevers or chills, continues to smoke a pack of cigarettes a day.-Work-up in the ED noted mild hypotension but improved with saline, severe hypokalemia CT chest concerning for interstitial lung disease and emphysema, CT abdomen suggestive of esophagitis and infectious/inflammatory colitis  S/P PEG placement 8/27. Currently receiving Osmolite 1.5 at 50 ml/h via PEG with Prosource TF 45 ml daily. Tolerating TF well.  Patient says she has had a few liquidy BM's within the past 24 hours, which she says is not abnormal for her.   Spoke with patient at bedside who reports that she is feeling okay. She has been eating very little because she feels full all the time. For breakfast today, she had pureed sausage and a few bites of applesauce, but nothing to drink. She  says she is ready to go home. She lives with 3 friends who will help her out at home when she is discharged. Discussed the option of bolus tube feedings with patient and she thinks boluses will work out better for her so she does not have to be attached to the TF pump all day. Discussed plan with RN.  Labs and medications reviewed. Na 132 K 6.1 Mag 2.6    Diet Order:   Diet Order            DIET - DYS 1 Room service appropriate? Yes; Fluid consistency: Thin  Diet effective now                 EDUCATION NEEDS:   Education needs have been addressed  Skin:  Skin Assessment: Skin Integrity Issues: Skin Integrity Issues:: Unstageable Unstageable: L hip  Last BM:  8/29 type 7  Height:   Ht Readings from Last 1 Encounters:  10/22/19 $RemoveB'5\' 5"'ERXOrIbg$  (1.651 m)    Weight:   Wt Readings from Last 1 Encounters:  10/24/19 49.1 kg    BMI:  Body mass index is 18.01 kg/m.  Estimated Nutritional Needs:   Kcal:  1700-1900  Protein:  80-90g  Fluid:  1.7L/day    [name] Mallow, RD, LDN, CNSC Please refer to Amion for contact information.

## 2019-10-31 NOTE — Progress Notes (Signed)
Progress Note    Kelly [name]  CNO:[id number] DOB: 1955/10/04  DOA: 10/21/2019 PCP: Arvilla Market, DO    Brief Narrative:    Medical records reviewed and are as summarized below:  Kelly [name] is an 64 y.o. female with history of COPD, tobacco abuse, chronic diastolic CHF, CAD/PCI and stents, hypertension, dyslipidemia presented to the emergency room 8/20 with worsening weakness, nausea, dysphagia and odynophagia, poor oral intake for over 2 months, this has been slowly progressive.  She has lost about 25 pounds, denies any fevers or chills, continues to smoke a pack of cigarettes a day. -Work-up in the ED noted mild hypotension but improved with saline, severe hypokalemia CT chest concerning for interstitial lung disease and emphysema, CT abdomen suggestive of esophagitis and infectious/inflammatory colitis -[facility] following , EGD was unremarkable, biopsy positive for Candida -Barium esophagram/evaluation for dysmotility was limited  Assessment/Plan:   Principal Problem:   Esophagitis Active Problems:   Dysphagia   Unintentional weight loss   Colitis   Hypokalemia   Malnutrition of moderate degree   Dysphagia/Nausea Severe protein calorie malnutrition/weight loss Adult failure to thrive -History of dysphagia and nausea progressively worsening for months, no overt findings concerning for malignancy based on CT chest/abdomen pelvis [facility] gastro[facility]ology consulting, endoscopy 8/21 was unremarkable, follow-up biopsy, Ba esophagram w/out overt findings, limited evaluation of dysmotility due to patient's nausea and inability to stand -candida noted on path-starting Fluconazole -MBS: Dysphagia, pharyngoesophageal phase: dys 1 thin liquids -TSH and random cortisol within normal limits -started marinol, scheduled zofran -G tube placed for further nutritional support- tube feeds being started- titrated to goal and is being changed to bolus  feeds -hyperglycemia: patient has given herself insulin in the past so will start novolog  Hyponatremia -improved with less free water  aspiration pna/leukocytosis -left side (yesterday patient was eating on left side so not suprising) -IV abx short term  Chronic diarrhea -CT abdomen with questionable mild colitis, clinically do not suspect active colitis -Antibiotics discontinued, could be malabsorption related -C. difficile PCR and GI pathogen panel are negative -still with loose stools -schedule imodium and use PRN as well -heme test -I reviewed flow-sheet, it appears stools have not been being documented properly  Anemia -unclear -? ABLA: check hgb -? 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 (Active)  10/22/19 0010  Location: Hip  Location Orientation: Left  Staging: Unstageable - Full thickness tissue loss in which the base of the injury is covered by slough (yellow, tan, gray, green or brown) and/or eschar (tan, brown or black) in the wound bed.  Wound Description (Comments):   Present on Admission: yes   Nutrition Status: Nutrition Problem: Moderate Malnutrition Etiology: chronic illness (dysphagia, esophagitis) Signs/Symptoms: mild fat depletion, mild muscle depletion, energy intake < or equal to 75% for > or equal to 1  month Interventions: Tube feeding   Poor insight into disease process, poor overall medical prognosis.  Long talk with patient to advise her to consider short term skilled nursing placement   Family Communication/Anticipated D/C date and plan/Code Status   DVT prophylaxis: Lovenox ordered. Code Status: Full Code.  Called son- left message 8/26 Disposition Plan: Status is: Inpatient  Remains inpatient appropriate because:Inpatient level of care appropriate due to severity of illness   Dispo: The patient is from: Home              Anticipated d/c is to: Home              Anticipated d/c date is: in AM              Patient currently is not medically stable to d/c. aggressive replacement of electrolytes         Medical Consultants:    GI  IR  Subjective:   Says her roommate with dementia is going to help care for her  Objective:    Vitals:   10/30/19 2238 10/31/19 0102 10/31/19 0602 10/31/19 0914  BP: (!) 88/32 (!) 94/33 (!) 111/55 (!) 121/55  Pulse:  67 61 72  Resp:    17  Temp:   (!) 97.5 F (36.4 C) 97.9 F (36.6 C)  TempSrc:   Oral Oral  SpO2:   94% 94%  Weight:      Height:        Intake/Output Summary (Last 24 hours) at 10/31/2019 1355 Last data filed at 10/31/2019 0945 Gross per 24 hour  Intake 1813.33 ml  Output --  Net 1813.33 ml   Filed Weights   10/24/19 1000 10/24/19 1938  Weight: 48.4 kg 49.1 kg    Exam:    General: Appearance:    Cachectic female in no acute distress     Lungs:      respirations unlabored  Heart:    Normal heart rate. Normal rhythm. No murmurs, rubs, or gallops.   MS:   All extremities are intact.   Neurologic:   Awake, alert    Data Reviewed:   I have personally reviewed following labs and imaging studies:  Labs: Labs show the following:   Basic Metabolic Panel: Recent Labs  Lab 10/27/19 0150 10/27/19 0150 10/28/19 0437 10/28/19 0437 10/29/19 0418 10/29/19 0418 10/30/19 0201 10/31/19 0546  NA  135  --  133*  --  131*  --  129* 132*  K 3.3*   < > 4.0   < > 3.6   < > 2.9* 6.1*  CL 107  --  107  --  105  --  103 110  CO2 22  --  22  --  19*  --  19* 16*  GLUCOSE 154*  --  121*  --  203*  --  189* 244*  BUN 16  --  18  --  18  --  20 27*  CREATININE 0.95  --  1.01*  --  1.11*  --  1.07* 1.40*  CALCIUM 7.4*  --  7.6*  --  7.6*  --  7.5* 7.7*  MG 1.8  --   --   --   --   --   --  2.6*  PHOS 2.6  --   --   --   --   --   --   --    < > = values in this interval not displayed.   GFR Estimated Creatinine Clearance: 30.6 mL/min (A) (by C-G formula based on SCr of 1.4 mg/dL (H)). Liver  Function Tests: Recent Labs  Lab 10/25/19 0632 10/26/19 0724  AST 8* 7*  ALT 7 7  ALKPHOS 42 38  BILITOT 0.5 0.3  PROT 4.7* 4.5*  ALBUMIN 2.1* 1.9*   No results for input(s): LIPASE, AMYLASE in the last 168 hours. No results for input(s): AMMONIA in the last 168 hours. Coagulation profile No results for input(s): INR, PROTIME in the last 168 hours.  CBC: Recent Labs  Lab 10/27/19 0150 10/28/19 0437 10/29/19 0418 10/30/19 0201 10/31/19 0546  WBC 8.9 8.2 13.1* 13.9* 9.0  HGB 9.5* 10.0* 10.6* 8.4* 7.8*  HCT 28.7* 30.5* 31.8* 24.8* 24.5*  MCV 101.1* 102.7* 102.6* 102.1* 105.2*  PLT 101* 123* 148* 149* 168   Cardiac Enzymes: No results for input(s): CKTOTAL, CKMB, CKMBINDEX, TROPONINI in the last 168 hours. BNP (last 3 results) No results for input(s): PROBNP in the last 8760 hours. CBG: Recent Labs  Lab 10/30/19 0645 10/30/19 1146 10/30/19 1639 10/31/19 0635 10/31/19 1146  GLUCAP 158* 175* 205* 240* 216*   D-Dimer: No results for input(s): DDIMER in the last 72 hours. Hgb A1c: No results for input(s): HGBA1C in the last 72 hours. Lipid Profile: No results for input(s): CHOL, HDL, LDLCALC, TRIG, CHOLHDL, LDLDIRECT in the last 72 hours. Thyroid function studies: No results for input(s): TSH, T4TOTAL, T3FREE, THYROIDAB in the last 72 hours.  Invalid input(s): FREET3 Anemia  work up: No results for input(s): VITAMINB12, FOLATE, FERRITIN, TIBC, IRON, RETICCTPCT in the last 72 hours. Sepsis Labs: Recent Labs  Lab 10/28/19 0437 10/29/19 0418 10/30/19 0201 10/31/19 0546  WBC 8.2 13.1* 13.9* 9.0    Microbiology Recent Results (from the past 240 hour(s))  SARS Coronavirus 2 by RT PCR (hospital order, performed in [facility] hospital lab) Nasopharyngeal Nasopharyngeal Swab     Status: None   Collection Time: 10/21/19  5:01 PM   Specimen: Nasopharyngeal Swab  Result Value Ref Range Status   SARS Coronavirus 2 NEGATIVE NEGATIVE Final    Comment: (NOTE) SARS-CoV-2 target nucleic acids are NOT DETECTED.  The SARS-CoV-2 RNA is generally detectable in upper and lower respiratory specimens during the acute phase of infection. The lowest concentration of SARS-CoV-2 viral copies this assay can detect is 250 copies / mL. A negative result does not preclude SARS-CoV-2 infection and should not be used as the sole basis for treatment or other patient management decisions.  A negative result may occur with improper specimen collection / handling, submission of specimen other than nasopharyngeal swab, presence of viral mutation(s) within the areas targeted by this assay, and inadequate number of viral copies (<250 copies / mL). A negative result must be combined with clinical observations, patient history, and epidemiological information.  Fact Sheet for Patients:   BoilerBrush.com.cy  Fact Sheet for Healthcare Providers: https://pope.com/  This test is not yet approved or  cleared by the [place] FDA and has been authorized for detection and/or diagnosis of SARS-CoV-2 by FDA under an Emergency Use Authorization (EUA).  This EUA will remain in effect (meaning this test can be used) for the duration of the COVID-19 declaration under Section 564(b)(1) of the Act, 21 U.S.C. section 360bbb-3(b)(1), unless the  authorization is terminated or revoked sooner.  Performed at [facility], [address], [place], [place] [address]   Culture, blood (routine x 2)     Status: None   Collection Time: 10/22/19 12:41 AM   Specimen: BLOOD  Result Value Ref Range Status   Specimen Description BLOOD LEFT ANTECUBITAL  Final   Special Requests   Final    BOTTLES DRAWN AEROBIC AND ANAEROBIC Blood Culture adequate volume   Culture   Final    NO GROWTH 5 DAYS Performed at [facility], [address], [place], [place] [address]    Report Status 10/27/2019 FINAL  Final  Culture, blood (routine x 2)     Status: None   Collection Time: 10/22/19 12:47 AM   Specimen: BLOOD RIGHT HAND  Result Value Ref Range Status   Specimen Description BLOOD RIGHT HAND  Final   Special Requests   Final    BOTTLES DRAWN AEROBIC ONLY Blood Culture adequate volume   Culture   Final    NO GROWTH 5 DAYS Performed at [facility], [address] [place], [place] [address]    Report Status 10/27/2019 FINAL  Final  Gastrointestinal Panel by PCR , Stool     Status: None   Collection Time: 10/22/19  4:45 PM   Specimen: STOOL  Result Value Ref Range Status   Campylobacter species NOT DETECTED NOT DETECTED Final   Plesimonas shigelloides NOT DETECTED NOT DETECTED Final   Salmonella species NOT DETECTED NOT DETECTED Final   Yersinia enterocolitica NOT DETECTED NOT DETECTED Final   Vibrio species NOT DETECTED NOT DETECTED Final   Vibrio cholerae NOT DETECTED NOT DETECTED Final   Enteroaggregative E coli (EAEC) NOT DETECTED NOT DETECTED Final   Enteropathogenic E coli (EPEC) NOT DETECTED NOT DETECTED Final   Enterotoxigenic E coli (ETEC) NOT DETECTED NOT DETECTED Final   Shiga like toxin producing E coli (STEC) NOT DETECTED NOT DETECTED Final   Shigella/Enteroinvasive E coli (EIEC) NOT DETECTED NOT DETECTED Final   Cryptosporidium NOT DETECTED NOT DETECTED Final   Cyclospora cayetanensis NOT DETECTED NOT  DETECTED Final   Entamoeba histolytica NOT DETECTED NOT DETECTED Final   Giardia lamblia NOT DETECTED NOT DETECTED Final   Adenovirus F40/41 NOT DETECTED NOT DETECTED Final   Astrovirus NOT DETECTED NOT DETECTED Final   Norovirus GI/GII NOT DETECTED NOT DETECTED Final   Rotavirus A NOT DETECTED NOT DETECTED Final   Sapovirus (I, II, IV, and V) NOT DETECTED NOT DETECTED Final    Comment: Performed at [facility], [address] [place], [place] [address]  C Difficile Quick Screen w PCR reflex     Status: None   Collection Time: 10/22/19  4:45 PM   Specimen: STOOL  Result Value Ref Range Status   C Diff antigen NEGATIVE NEGATIVE Final   C Diff toxin NEGATIVE NEGATIVE Final   C Diff interpretation No C. difficile detected.  Final    Comment: Performed at [facility], [address], [place], [place] [address]    Procedures and diagnostic studies:  DG CHEST PORT 1 VIEW  Result Date: 10/30/2019 CLINICAL DATA:  Aspiration and airway EXAM: PORTABLE CHEST 1 VIEW COMPARISON:  October 21, 2019 FINDINGS: The cardiomediastinal silhouette is stable. New infiltrate in the left mid lung and left retrocardiac region. Mild diffuse interstitial prominence. No pneumothorax. No other changes. IMPRESSION: 1. New infiltrate in the left mid lung and left retrocardiac region may represent aspiration or pneumonia. Recommend clinical correlation. 2. Minimal interstitial prominence suggests pulmonary venous congestion. 3. No other acute abnormalities. Electronically Signed   By: Gerome Sam III M.D   On: 10/30/2019 08:23    Medications:   . citalopram  20 mg Oral QHS  . collagenase   Topical Daily  . diclofenac Sodium  4 g Topical QID  . dronabinol  2.5 mg Oral BID AC  . enoxaparin (LOVENOX) injection  40 mg Subcutaneous Q24H  . feeding supplement (OSMOLITE 1.5 CAL)  1,000 mL Per Tube Q24H  . feeding supplement (PROSource TF)  45 mL Per Tube Daily  . insulin aspart  0-15 Units  Subcutaneous TID WC  . insulin aspart  0-5 Units Subcutaneous QHS  . levothyroxine  25 mcg Oral Q0600  . loperamide  2 mg Oral BID  . nicotine  21 mg Transdermal Daily  . ondansetron (ZOFRAN) IV  4 mg Intravenous TID AC  . pantoprazole  40 mg Oral Q1200  . pregabalin  75 mg Oral BID  . topiramate  25 mg Oral BID   Continuous Infusions: . azithromycin 500 mg (10/31/19 1100)  . cefTRIAXone (ROCEPHIN)  IV 2 g (10/31/19 0908)     LOS: 9 days   Joseph Art  [facility]   How to contact the [facility] Attending or Consulting provider 7A - 7P or covering provider during after hours 7P -7A, for this patient?  1. Check the care team in [facility] and look for a) attending/consulting TR[facility] provider listed and b) the [facility] team listed 2. Log into [email or web address] and use [place]'s universal password to access. If you do not have the password, please contact the hospital operator. 3. Locate the [facility] provider you are looking for under [facility] and page to a number that you can be directly reached. 4. If you still have difficulty reaching the provider, please page the [facility] (Director on Call) for the Hospitalists listed on amion for assistance.  10/31/2019, 1:55 PM

## 2019-10-31 NOTE — Progress Notes (Signed)
  Speech Language Pathology Treatment: Dysphagia  Patient Details Name: Kelly [name] MRN: [id number] DOB: Jun 20, 1955 Today's Date: 10/31/2019 Time: 2330-0762 SLP Time Calculation (min) (ACUTE ONLY): 11 min  Assessment / Plan / Recommendation Clinical Impression  Pt was seen for skilled ST targeting diet tolerance.  She was encountered asleep in bed, and she roused to moderate verbal and tactile stimulation.  RN reported that pt was tolerating oral diet (Dys 1/thin liquid) and tube feeds via PEG without difficulty.  Pt denied coughing/choking with PO intake, but stated that she was still having intermittent regurgitation.  Minimal trials of thin liquid and puree were observed on this date secondary to pt recently finishing breakfast in addition to tube feeds.  Pt was re-educated regarding safe swallow strategies prior to PO intake, and she demonstrated understanding given minimal verbal cues.  Pt took small, single bites/sips, utilized a dry swallow following bites/sips, and she intermittently cleared her throat with oral intake.  Vocal quality remained clear throughout trials; however, pt was noted to sound congested when she was cued to clear her throat/cough volitionally during oral intake.  No overt s/sx of aspiration were observed with any trials on this date.  SLP will continue to f/u to monitor diet tolerance and for additional education regarding compensatory strategies given risk of aspiration.     HPI HPI: Kelly [name] is a 64 y.o. female with medical history significant of CAD status post PCI, chronic diastolic CHF, hypertension, hyperlipidemia presenting with complaints of generalized weakness, nausea, dysphagia, and poor oral intake. Patient states she has had difficulty swallowing for at least 2 months now and she has been losing a lot of weight. Both food and liquid gets stuck when she tries to swallow and it is painful. States she has lost at least 20 to 25 pounds. She feels nauseous but is not  vomiting. She is hardly able to eat anything and when she does, she experiences diarrhea. Denies abdominal pain. Reports having a chronic cough due to history of smoking. She smokes 1 pack of cigarettes daily. Denies fevers, shortness of breath, or chest pain. She has not been vaccinated against Covid.  CT of the abdomen and pelvis with findings suggestive of esophagitis and infection or inflammatory colitis. PEG tube was placed on 10/28/2019.        SLP Plan  Continue with current plan of care       Recommendations  Diet recommendations: Dysphagia 1 (puree);Thin liquid Liquids provided via: Cup Medication Administration: Crushed with puree Supervision: Patient able to self feed;Intermittent supervision to cue for compensatory strategies Compensations: Slow rate;Small sips/bites;Follow solids with liquid;Multiple dry swallows after each bite/sip;Clear throat intermittently Postural Changes and/or Swallow Maneuvers: Seated upright 90 degrees;Upright 30-60 min after meal                Oral Care Recommendations: Oral care BID Follow up Recommendations: Home health SLP SLP Visit Diagnosis: Dysphagia, pharyngoesophageal phase (R13.14) Plan: Continue with current plan of care       GO               Villa Herb M.S., CCC-SLP Acute Rehabilitation Services Office: [phone]  Shanon Rosser [facility] 10/31/2019, 10:07 AM

## 2019-10-31 NOTE — Progress Notes (Signed)
Patient had LBM twice this shift. 1 in am and 1 in pm. Stool was water, yellowish with no solids noted about medium in amount, incontinent with no bloody or dark colored stool noted. Non foul smelling.

## 2019-10-31 NOTE — Progress Notes (Signed)
Inpatient Diabetes Program Recommendations  AACE/ADA: New Consensus Statement on Inpatient Glycemic Control (2015)  Target Ranges:  Prepandial:   less than 140 mg/dL      Peak postprandial:   less than 180 mg/dL (1-2 hours)      Critically ill patients:  140 - 180 mg/dL   Lab Results  Component Value Date   GLUCAP 240 (H) 10/31/2019   HGBA1C 5.3 10/28/2019    Review of Glycemic Control Results for Kelly, [name] (MRN [id number]) as of 10/31/2019 09:40  Ref. Range 10/30/2019 06:45 10/30/2019 11:46 10/30/2019 16:39 10/31/2019 06:35  Glucose-Capillary Latest Ref Range: 70 - 99 mg/dL 162 (H) 446 (H) 950 (H) 240 (H)   Diabetes history: None A1c 5.3%  Current orders for Inpatient glycemic control:  Novolog 0-15 units tid+ hs  Osmolite 50 ml/hour Dysphagia Diet ordered  Inpatient Diabetes Program Recommendations:    - While on Tube feeds change Novolog Correction scale to Q4 hours.  - Consider adding Novolog 2-3 units Q4 hours Tube Feed Coverage ( do not give if tube feeds are stopped or held)  Thanks,  Christena Deem RN, MSN, BC-ADM Inpatient Diabetes Coordinator Team Pager [phone] (8a-5p)

## 2019-10-31 NOTE — Progress Notes (Signed)
Physical Therapy Treatment Patient Details Name: Kelly [name] MRN: [id number] DOB: [birth year] Today's Date: 10/31/2019    History of Present Illness 64 yo female presenting to ED with weakness, nausea, and poor oral intake. CT chest concerning for interstitial lung disease and emphysema, CT abdomen suggestive of esophagitis and infectious/inflammatory colitis. PMH including COPD, tobacco abuse, chronic diastolic CHF, CAD/PCI and stents, HTN, and dyslipidemia.    PT Comments    Continuing work on functional mobility and activity tolerance;  Making very slow progress, and so far has not been able to walk more than 5 feet with RW; We discussed the stairs to access her bedroom and bathroom, and she isn't able to tell me the last time she was able to ascend them on her own power; she is unable to tell her plan for the stairs except that she would ask EMS to help her get up the stairs "the way they helped me [Ms. Banik] get out"; She has a point -- ambualnce transport home with EMS to help her up the flight of steps with a stair chair is the most reasonable option at this time;   I briefly discussed her case with Caprice Beaver RN, and she indicated Ms. Ruggerio gives care to her roommate; I'm concerned for her safety at home, and highly recommend SNF for post-acute rehab; I also anticipate she will not agree, and would then rec maximizing HH services   Follow Up Recommendations  SNF;Supervision for mobility/OOB     Equipment Recommendations  Rolling walker with 5" wheels;3in1 (PT);Other (comment) (wc if d/c home; ambulance ride home)    Recommendations for Other Services       Precautions / Restrictions Precautions Precautions: Fall Precaution Comments: G-tube    Mobility  Bed Mobility Overal bed mobility: Needs Assistance Bed Mobility: Supine to Sit     Supine to sit: Min assist     General bed mobility comments: min handheld assist to pull to sit  Transfers Overall transfer level: Needs  assistance Equipment used: Rolling walker (2 wheeled) Transfers: Sit to/from Stand Sit to Stand: Min assist         General transfer comment: minA to power up with cues for hand placement and standing posture, no assist needed to steady. marked trunk flexion  Ambulation/Gait Ambulation/Gait assistance: Mod assist Gait Distance (Feet): 5 Feet Assistive device: Rolling walker (2 wheeled) Gait Pattern/deviations: Step-to pattern;Decreased stride length;Shuffle;Trunk flexed Gait velocity: decreased   General Gait Details: pt able to take small lateral steps in both directions (5 ft x2) with use of RW and minA for RW movement. pt with significant trunk flexion (almost 90 deg) despite cues for improved upright posture.   Stairs         General stair comments: Asked pt about the steps to reach her bedroom and bathroom, and she tells me she hasn't gone up them herself in quite some time; unable to tell me the last time she went up the stairs or even give me a ballpark timeframe (wekks?months? years?)   Wheelchair Mobility    Modified Rankin (Stroke Patients Only)       Balance     Sitting balance-Leahy Scale: Fair       Standing balance-Leahy Scale: Poor                              Cognition Arousal/Alertness: Awake/alert Behavior During Therapy: Flat affect Overall Cognitive Status: No family/caregiver present to determine  baseline cognitive functioning                                        Exercises      General Comments        Pertinent Vitals/Pain Pain Assessment: No/denies pain Pain Intervention(s): Monitored during session    Home Living                      Prior Function            PT Goals (current goals can now be found in the care plan section) Acute Rehab PT Goals Patient Stated Goal: Go home PT Goal Formulation: With patient Time For Goal Achievement: 11/05/19 Potential to Achieve Goals:  Good Progress towards PT goals: Progressing toward goals Oleta Mouse)    Frequency    Min 3X/week      PT Plan Current plan remains appropriate    Co-evaluation              AM-PAC PT "6 Clicks" Mobility   Outcome Measure  Help needed turning from your back to your side while in a flat bed without using bedrails?: None Help needed moving from lying on your back to sitting on the side of a flat bed without using bedrails?: A Little Help needed moving to and from a bed to a chair (including a wheelchair)?: A Little Help needed standing up from a chair using your arms (e.g., wheelchair or bedside chair)?: A Little Help needed to walk in hospital room?: A Lot Help needed climbing 3-5 steps with a railing? : Total 6 Click Score: 16    End of Session Equipment Utilized During Treatment: Gait belt Activity Tolerance: Patient tolerated treatment well Patient left: in chair;with call bell/phone within reach;with chair alarm set Nurse Communication: Mobility status PT Visit Diagnosis: Muscle weakness (generalized) (M62.81);Difficulty in walking, not elsewhere classified (R26.2);History of falling (Z91.81)     Time: 2778-2423 PT Time Calculation (min) (ACUTE ONLY): 24 min  Charges:  $Gait Training: 8-22 mins $Therapeutic Activity: 8-22 mins                     Van Clines, PT  Acute Rehabilitation Services Pager [phone] Office [phone]    Levi Aland 10/31/2019, 7:55 PM

## 2019-11-01 LAB — CBC
HCT: 24.2 % — ABNORMAL LOW (ref 36.0–46.0)
Hemoglobin: 7.9 g/dL — ABNORMAL LOW (ref 12.0–15.0)
MCH: 34.3 pg — ABNORMAL HIGH (ref 26.0–34.0)
MCHC: 32.6 g/dL (ref 30.0–36.0)
MCV: 105.2 fL — ABNORMAL HIGH (ref 80.0–100.0)
Platelets: 222 10*3/uL (ref 150–400)
RBC: 2.3 MIL/uL — ABNORMAL LOW (ref 3.87–5.11)
RDW: 14.4 % (ref 11.5–15.5)
WBC: 7 10*3/uL (ref 4.0–10.5)
nRBC: 0 % (ref 0.0–0.2)

## 2019-11-01 LAB — BASIC METABOLIC PANEL
Anion gap: 7 (ref 5–15)
BUN: 30 mg/dL — ABNORMAL HIGH (ref 8–23)
CO2: 17 mmol/L — ABNORMAL LOW (ref 22–32)
Calcium: 7.8 mg/dL — ABNORMAL LOW (ref 8.9–10.3)
Chloride: 110 mmol/L (ref 98–111)
Creatinine, Ser: 1.44 mg/dL — ABNORMAL HIGH (ref 0.44–1.00)
GFR calc Af Amer: 44 mL/min — ABNORMAL LOW (ref >60)
GFR calc non Af Amer: 38 mL/min — ABNORMAL LOW (ref >60)
Glucose, Bld: 87 mg/dL (ref 70–99)
Potassium: 6.1 mmol/L — ABNORMAL HIGH (ref 3.5–5.1)
Sodium: 134 mmol/L — ABNORMAL LOW (ref 135–145)

## 2019-11-01 LAB — VITAMIN B12: Vitamin B-12: 137 pg/mL — ABNORMAL LOW (ref 180–914)

## 2019-11-01 LAB — GLUCOSE, CAPILLARY
Glucose-Capillary: 115 mg/dL — ABNORMAL HIGH (ref 70–99)
Glucose-Capillary: 119 mg/dL — ABNORMAL HIGH (ref 70–99)
Glucose-Capillary: 140 mg/dL — ABNORMAL HIGH (ref 70–99)
Glucose-Capillary: 82 mg/dL (ref 70–99)
Glucose-Capillary: 82 mg/dL (ref 70–99)
Glucose-Capillary: 93 mg/dL (ref 70–99)

## 2019-11-01 MED ORDER — SODIUM CHLORIDE 0.9 % IV SOLN: INTRAVENOUS | Status: DC

## 2019-11-01 MED ORDER — INSULIN ASPART 100 UNIT/ML ~~LOC~~ SOLN
2.0000 [IU] | SUBCUTANEOUS | Status: DC
  Administered 2019-11-01 – 2019-11-05 (×10): 2 [IU] via SUBCUTANEOUS

## 2019-11-01 MED ORDER — AZITHROMYCIN 500 MG PO TABS
500.0000 mg | ORAL_TABLET | Freq: Every day | ORAL | Status: DC
  Administered 2019-11-01 – 2019-11-03 (×3): 500 mg via ORAL
  Filled 2019-11-01 (×3): qty 1

## 2019-11-01 MED ORDER — ENOXAPARIN SODIUM 30 MG/0.3ML ~~LOC~~ SOLN
30.0000 mg | SUBCUTANEOUS | Status: DC
  Administered 2019-11-01 – 2019-11-02 (×2): 30 mg via SUBCUTANEOUS
  Filled 2019-11-01 (×2): qty 0.3

## 2019-11-01 MED ORDER — SODIUM ZIRCONIUM CYCLOSILICATE 5 G PO PACK
5.0000 g | PACK | Freq: Once | ORAL | Status: AC
  Administered 2019-11-01: 5 g via ORAL
  Filled 2019-11-01: qty 1

## 2019-11-01 MED ORDER — CYANOCOBALAMIN 1000 MCG/ML IJ SOLN
1000.0000 ug | Freq: Once | INTRAMUSCULAR | Status: AC
  Administered 2019-11-01: 1000 ug via INTRAMUSCULAR
  Filled 2019-11-01: qty 1

## 2019-11-01 NOTE — TOC Progression Note (Signed)
Transition of Care [facility]) - Progression Note    Patient Details  Name: Kelly [name] MRN: [id number] Date of Birth: [birth year]  Transition of Care [facility]) CM/SW Contact  Kelly Kinds, RN Phone Number: [phone] 11/01/2019, 12:11 PM  Clinical Narrative:     Spoke with patient at the bedside to discuss transition home. Patient states that she lives in an upstairs apartment with a roommate. Her landlord, Kelly [name], lives downstairs. Kelly [name] provides assistance with groceries. Medications are mailed through [facility]. Patient stays home. Her PCP is Dr. Earlene [name], and she did go to his office in October. Discussed ambulance transport to go home at time of discharge.   Spoke with MD who stated that patient is now agreeable to consider SNF for short term rehab. Spoke with patient at bedside along with MD, and patient stated that she is willing to consider SNF option.   TOC to follow up with transition needs.   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

## 2019-11-01 NOTE — Progress Notes (Signed)
Progress Note    Kelly [name]  OEU:[id number] DOB: 1955-09-01  DOA: 10/21/2019 PCP: Arvilla Market, DO    Brief Narrative:    Medical records reviewed and are as summarized below:  Kelly [name] is an 64 y.o. female with history of COPD, tobacco abuse, chronic diastolic CHF, CAD/PCI and stents, hypertension, dyslipidemia presented to the emergency room 8/20 with worsening weakness, nausea, dysphagia and odynophagia, poor oral intake for over 2 months, this has been slowly progressive.  Work up including EGD/MBS showed candida and dysphagia.  G tube was placed for nutrition supplementation.  Slow to recover and safe d/c planning has been an issue.  Patient currently willing to consider SNF placement as I do not think she is safe at home.  I have asked psych to address her depression  Assessment/Plan:   Principal Problem:   Esophagitis Active Problems:   Dysphagia   Unintentional weight loss   Colitis   Hypokalemia   Malnutrition of moderate degree   Dysphagia/Nausea with Severe protein calorie malnutrition/weight loss and Adult failure to thrive -History of dysphagia and nausea progressively worsening for months, no overt findings concerning for malignancy based on CT chest/abdomen pelvis [facility] gastroenterology consulting, endoscopy 8/21 was unremarkable, follow-up biopsy, Ba esophagram w/out overt findings, limited evaluation of dysmotility due to patient's nausea and inability to stand -candida noted on path-starting Fluconazole -MBS: Dysphagia, pharyngoesophageal phase: dys 1 thin liquids -TSH and random cortisol within normal limits -started marinol, scheduled zofran -G tube placed for further nutritional support after patient and GI discussion- tube feeds: changed to bolus feeds -hyperglycemia: patient has given herself insulin in the past so will start novolog q 4 hours  Hyponatremia -improved with less free water  Metabolic acidosis -hold topamax for  now -daily labs -consider bicarb replacement  aspiration pna/leukocytosis -left side infiltrate (one day patient was eating while laying on left side so not suprising) -IV abx short term  Chronic diarrhea -CT abdomen with questionable mild colitis, clinically do not suspect active colitis -Antibiotics discontinued, could be malabsorption related -C. difficile PCR and GI pathogen panel are negative -still with loose stools -schedule imodium and use PRN as well -heme test  Anemia -unclear -? ABLA: check stool -low B12: replace IM x 1 -? 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 Heavy tobacco abuse -Concern for interstitial lung disease based on CT, no wheezing or oxygen requirement at this time -Counseled, continue nebs as needed -Recommended pulmonary follow-up  Depression -continue celexa for now -ask for psych consult to help with medication adjustment  CAD History of PCI/stents -Stable  Acute on Chronic diastolic CHF -with low albumin volume status is difficult to assess -suspect currently she is on the dry side so will do gentle IVF overnight with BMP in AM   Pressure Injury 10/22/19 Hip Left Unstageable - Full thickness tissue loss in which the base of the injury is covered by slough (yellow, tan, gray, green or brown) and/or eschar (tan, brown or black) in the wound bed. (Active)  10/22/19 0010  Location: Hip  Location Orientation: Left  Staging: Unstageable - Full thickness tissue loss in which the base of the injury is covered by slough (yellow, tan, gray, green or brown) and/or eschar (tan, brown or black) in the wound bed.  Wound Description (Comments):   Present on Admission: yes   Nutrition Status: Nutrition Problem: Moderate Malnutrition Etiology: chronic illness (dysphagia, esophagitis) Signs/Symptoms: mild fat depletion, mild  muscle depletion,  energy intake < or equal to 75% for > or equal to 1 month Interventions: Tube feeding   Patient willing to consider SNF options   Family Communication/Anticipated D/C date and plan/Code Status   DVT prophylaxis: Lovenox ordered. Code Status: Full Code.  Called son- left message 8/26 with patient's permission Disposition Plan: Status is: Inpatient  Remains inpatient appropriate because:Inpatient level of care appropriate due to severity of illness   Dispo: The patient is from: Home              Anticipated d/c is to: Home              Anticipated d/c date is: in AM              Patient currently is not medically stable to d/c. safe d/c plan         Medical Consultants:    GI- signed off Deboraha Sprang)  IR- placed g tube  Subjective:   Says she is willing to consider rehab options  Objective:    Vitals:   10/31/19 1720 10/31/19 2123 11/01/19 0404 11/01/19 0932  BP: (!) 125/38 (!) 121/43 (!) 127/46 (!) 125/47  Pulse: 76 71 61 68  Resp: 18 17 19 18   Temp: 98.5 F (36.9 C) 97.8 F (36.6 C) 97.9 F (36.6 C) 98 F (36.7 C)  TempSrc: Oral Oral Oral Oral  SpO2: 97% 91% 91% 95%  Weight:      Height:        Intake/Output Summary (Last 24 hours) at 11/01/2019 1315 Last data filed at 11/01/2019 1055 Gross per 24 hour  Intake 2170.83 ml  Output 425 ml  Net 1745.83 ml   Filed Weights   10/24/19 1000 10/24/19 1938  Weight: 48.4 kg 49.1 kg    Exam:    General: Appearance:    Cachectic female in no acute distress     Lungs:     respirations unlabored  Heart:    Normal heart rate.    MS:   All extremities are intact.   Neurologic:   Awake, alert- poor eye contact, delayed response to quesitons    Data Reviewed:   I have personally reviewed following labs and imaging studies:  Labs: Labs show the following:   Basic Metabolic Panel: Recent Labs  Lab 10/27/19 0150 10/27/19 0150 10/28/19 0437 10/28/19 0437 10/29/19 0418 10/29/19 0418 10/30/19 0201  10/30/19 0201 10/31/19 0546 11/01/19 0743  NA 135   < > 133*  --  131*  --  129*  --  132* 134*  K 3.3*   < > 4.0   < > 3.6   < > 2.9*   < > 6.1* 6.1*  CL 107   < > 107  --  105  --  103  --  110 110  CO2 22   < > 22  --  19*  --  19*  --  16* 17*  GLUCOSE 154*   < > 121*  --  203*  --  189*  --  244* 87  BUN 16   < > 18  --  18  --  20  --  27* 30*  CREATININE 0.95   < > 1.01*  --  1.11*  --  1.07*  --  1.40* 1.44*  CALCIUM 7.4*   < > 7.6*  --  7.6*  --  7.5*  --  7.7* 7.8*  MG 1.8  --   --   --   --   --   --   --  2.6*  --   PHOS 2.6  --   --   --   --   --   --   --   --   --    < > = values in this interval not displayed.   GFR Estimated Creatinine Clearance: 29.8 mL/min (A) (by C-G formula based on SCr of 1.44 mg/dL (H)). Liver Function Tests: Recent Labs  Lab 10/26/19 0724  AST 7*  ALT 7  ALKPHOS 38  BILITOT 0.3  PROT 4.5*  ALBUMIN 1.9*   No results for input(s): LIPASE, AMYLASE in the last 168 hours. No results for input(s): AMMONIA in the last 168 hours. Coagulation profile No results for input(s): INR, PROTIME in the last 168 hours.  CBC: Recent Labs  Lab 10/28/19 0437 10/29/19 0418 10/30/19 0201 10/31/19 0546 11/01/19 0743  WBC 8.2 13.1* 13.9* 9.0 7.0  HGB 10.0* 10.6* 8.4* 7.8* 7.9*  HCT 30.5* 31.8* 24.8* 24.5* 24.2*  MCV 102.7* 102.6* 102.1* 105.2* 105.2*  PLT 123* 148* 149* 168 222   Cardiac Enzymes: No results for input(s): CKTOTAL, CKMB, CKMBINDEX, TROPONINI in the last 168 hours. BNP (last 3 results) No results for input(s): PROBNP in the last 8760 hours. CBG: Recent Labs  Lab 10/31/19 2119 11/01/19 0022 11/01/19 0401 11/01/19 0823 11/01/19 1108  GLUCAP 170* 82 93 82 119*   D-Dimer: No results for input(s): DDIMER in the last 72 hours. Hgb A1c: No results for input(s): HGBA1C in the last 72 hours. Lipid Profile: No results for input(s): CHOL, HDL, LDLCALC, TRIG, CHOLHDL, LDLDIRECT in the last 72 hours. Thyroid function studies: No  results for input(s): TSH, T4TOTAL, T3FREE, THYROIDAB in the last 72 hours.  Invalid input(s): FREET3 Anemia work up: Recent Labs    11/01/19 0525  VITAMINB12 137*   Sepsis Labs: Recent Labs  Lab 10/29/19 0418 10/30/19 0201 10/31/19 0546 11/01/19 0743  WBC 13.1* 13.9* 9.0 7.0    Microbiology Recent Results (from the past 240 hour(s))  Gastrointestinal Panel by PCR , Stool     Status: None   Collection Time: 10/22/19  4:45 PM   Specimen: STOOL  Result Value Ref Range Status   Campylobacter species NOT DETECTED NOT DETECTED Final   Plesimonas shigelloides NOT DETECTED NOT DETECTED Final   Salmonella species NOT DETECTED NOT DETECTED Final   Yersinia enterocolitica NOT DETECTED NOT DETECTED Final   Vibrio species NOT DETECTED NOT DETECTED Final   Vibrio cholerae NOT DETECTED NOT DETECTED Final   Enteroaggregative E coli (EAEC) NOT DETECTED NOT DETECTED Final   Enteropathogenic E coli (EPEC) NOT DETECTED NOT DETECTED Final   Enterotoxigenic E coli (ETEC) NOT DETECTED NOT DETECTED Final   Shiga like toxin producing E coli (STEC) NOT DETECTED NOT DETECTED Final   Shigella/Enteroinvasive E coli (EIEC) NOT DETECTED NOT DETECTED Final   Cryptosporidium NOT DETECTED NOT DETECTED Final   Cyclospora cayetanensis NOT DETECTED NOT DETECTED Final   Entamoeba histolytica NOT DETECTED NOT DETECTED Final   Giardia lamblia NOT DETECTED NOT DETECTED Final   Adenovirus F40/41 NOT DETECTED NOT DETECTED Final   Astrovirus NOT DETECTED NOT DETECTED Final   Norovirus GI/GII NOT DETECTED NOT DETECTED Final   Rotavirus A NOT DETECTED NOT DETECTED Final   Sapovirus (I, II, IV, and V) NOT DETECTED NOT DETECTED Final    Comment: Performed at [facility], [address] [place], [place] [address]  C Difficile Quick Screen w PCR reflex     Status: None   Collection Time:  10/22/19  4:45 PM   Specimen: STOOL  Result Value Ref Range Status   C Diff antigen NEGATIVE NEGATIVE Final   C Diff  toxin NEGATIVE NEGATIVE Final   C Diff interpretation No C. difficile detected.  Final    Comment: Performed at [facility], [address] [place], [place] [address]    Procedures and diagnostic studies:  No results found.  Medications:    azithromycin  500 mg Oral Daily   citalopram  20 mg Oral QHS   collagenase   Topical Daily   diclofenac Sodium  4 g Topical QID   dronabinol  2.5 mg Oral BID AC   enoxaparin (LOVENOX) injection  30 mg Subcutaneous Q24H   feeding supplement (OSMOLITE 1.5 CAL)  300 mL Per Tube QID   insulin aspart  0-15 Units Subcutaneous TID WC   insulin aspart  0-5 Units Subcutaneous QHS   insulin aspart  3 Units Subcutaneous Q4H   levothyroxine  25 mcg Oral Q0600   loperamide  2 mg Oral BID   nicotine  21 mg Transdermal Daily   ondansetron (ZOFRAN) IV  4 mg Intravenous TID AC   pantoprazole  40 mg Oral Q1200   pregabalin  75 mg Oral BID   sodium zirconium cyclosilicate  5 g Oral Once   Continuous Infusions:  cefTRIAXone (ROCEPHIN)  IV 2 g (11/01/19 1055)     LOS: 10 days   Joseph Art  [facility]   How to contact the [facility] Attending or Consulting provider 7A - 7P or covering provider during after hours 7P -7A, for this patient?  1. Check the care team in [facility] and look for a) attending/consulting TRH provider listed and b) the [facility] team listed 2. Log into [email or web address] and use [facility]'s universal password to access. If you do not have the password, please contact the hospital operator. 3. Locate the [facility] provider you are looking for under [facility] and page to a number that you can be directly reached. 4. If you still have difficulty reaching the provider, please page the [facility] (Director on Call) for the Hospitalists listed on amion for assistance.  11/01/2019, 1:15 PM

## 2019-11-01 NOTE — Progress Notes (Signed)
Occupational Therapy Treatment Patient Details Name: Kelly [name] MRN: [id number] DOB: 06/05/1955 Today's Date: 11/01/2019    History of present illness 64 yo female presenting to ED with weakness, nausea, and poor oral intake. CT chest concerning for interstitial lung disease and emphysema, CT abdomen suggestive of esophagitis and infectious/inflammatory colitis. PMH including COPD, tobacco abuse, chronic diastolic CHF, CAD/PCI and stents, HTN, and dyslipidemia.   OT comments  Pt noted with flat affect, but agreeable to participate in therapy. Pt did require increased assistance for bed mobility, but demonstrated improved ability to transfer with RW. Pt continues to require increased assist for LB ADLs, noted with bowel difficulties last night. Continue to recommend SNF for ST rehab due to high fall risk and assist needed for ADLs. However, pt with desire to return home. Will continue to monitor and progress independence with ADLs.    Follow Up Recommendations  SNF;Supervision/Assistance - 24 hour;Other (comment) (HHOT, 24/7 and max HH services if pt declines SNF)    Equipment Recommendations  3 in 1 bedside commode;Other (comment) (RW)    Recommendations for Other Services Other (comment) (HH aide)    Precautions / Restrictions Precautions Precautions: Fall Precaution Comments: G-tube Restrictions Weight Bearing Restrictions: No       Mobility Bed Mobility Overal bed mobility: Needs Assistance Bed Mobility: Supine to Sit     Supine to sit: Max assist     General bed mobility comments: Max A to sit EOB, slow motor movements today  Transfers Overall transfer level: Needs assistance Equipment used: Rolling walker (2 wheeled) Transfers: Sit to/from [employer] Sit to Stand: Min assist Stand pivot transfers: Min assist       General transfer comment: Min A for power up, (took 3 tries but improved with B hands pushing off of bed), Min A for stand pivot and 2  steps to recliner with RW. Minor cueing for technique    Balance Overall balance assessment: Needs assistance;History of Falls Sitting-balance support: No upper extremity supported;Feet supported Sitting balance-Leahy Scale: Fair     Standing balance support: Bilateral upper extremity supported Standing balance-Leahy Scale: Poor Standing balance comment: Reliant on UE support, trunk very flexed                           ADL either performed or assessed with clinical judgement   ADL Overall ADL's : Needs assistance/impaired     Grooming: Set up;Brushing hair;Sitting Grooming Details (indicate cue type and reason): Setup for brushing hair seated in recliner             Lower Body Dressing: Sit to/from stand;Moderate assistance Lower Body Dressing Details (indicate cue type and reason): Assistance to adjust socks sitting EOB Toilet Transfer: Minimal assistance;Stand-pivot;RW Toilet Transfer Details (indicate cue type and reason): simulated to recliner with RW           General ADL Comments: Pt with flat affect today, reports stomach pain but still participatory in activities     Vision   Vision Assessment?: No apparent visual deficits   Perception     Praxis      Cognition Arousal/Alertness: Awake/alert Behavior During Therapy: Flat affect Overall Cognitive Status: No family/caregiver present to determine baseline cognitive functioning                                 General Comments: Pt with flat affect, decreased responses to  questions but willing to attempt tasks with OT        Exercises     Shoulder Instructions       General Comments Pt noted with flat affect, depressive symptoms as compared to previous OT session.  Pt agreeable to sit in recliner chair and repositioned for comfort with warm blankets    Pertinent Vitals/ Pain       Pain Assessment: Faces Faces Pain Scale: Hurts little more Pain Location: stomach Pain  Descriptors / Indicators: Grimacing;Sore Pain Intervention(s): Other (comment) (Notified RN who administered meds)  Home Living                                          Prior Functioning/Environment              Frequency  Min 2X/week        Progress Toward Goals  OT Goals(current goals can now be found in the care plan section)  Progress towards OT goals: Progressing toward goals  Acute Rehab OT Goals Patient Stated Goal: Go home OT Goal Formulation: With patient Time For Goal Achievement: 11/05/19 Potential to Achieve Goals: Good ADL Goals Pt Will Perform Grooming: sitting;standing;with modified independence Pt Will Perform Upper Body Dressing: with modified independence;sitting Pt Will Perform Lower Body Dressing: with modified independence;sit to/from stand Pt Will Transfer to Toilet: with modified independence;ambulating;bedside commode Pt Will Perform Toileting - Clothing Manipulation and hygiene: with modified independence;sitting/lateral leans;sit to/from stand  Plan Discharge plan remains appropriate    Co-evaluation                 AM-PAC OT "6 Clicks" Daily Activity     Outcome Measure   Help from another person eating meals?: A Little Help from another person taking care of personal grooming?: A Little Help from another person toileting, which includes using toliet, bedpan, or urinal?: A Lot Help from another person bathing (including washing, rinsing, drying)?: A Little Help from another person to put on and taking off regular upper body clothing?: A Little Help from another person to put on and taking off regular lower body clothing?: A Lot 6 Click Score: 16    End of Session Equipment Utilized During Treatment: Gait belt;Rolling walker  OT Visit Diagnosis: Unsteadiness on feet (R26.81);Other abnormalities of gait and mobility (R26.89);Muscle weakness (generalized) (M62.81);History of falling (Z91.81)   Activity Tolerance  Patient limited by fatigue   Patient Left in chair;with call bell/phone within reach;with chair alarm set;with nursing/sitter in room   Nurse Communication Mobility status;Other (comment) (pain, RN present at end of session)        Time: 1027-1049 OT Time Calculation (min): 22 min  Charges: OT General Charges $OT Visit: 1 Visit OT Treatments $Therapeutic Activity: 8-22 mins  Lorre Munroe, OTR/L   Lorre Munroe 11/01/2019, 11:08 AM

## 2019-11-01 NOTE — Plan of Care (Signed)
  Problem: Activity: Goal: Risk for activity intolerance will decrease Outcome: Progressing   Problem: Elimination: Goal: Will not experience complications related to bowel motility Outcome: Progressing   

## 2019-11-02 LAB — BASIC METABOLIC PANEL
Anion gap: 8 (ref 5–15)
BUN: 28 mg/dL — ABNORMAL HIGH (ref 8–23)
CO2: 19 mmol/L — ABNORMAL LOW (ref 22–32)
Calcium: 7.6 mg/dL — ABNORMAL LOW (ref 8.9–10.3)
Chloride: 105 mmol/L (ref 98–111)
Creatinine, Ser: 1.39 mg/dL — ABNORMAL HIGH (ref 0.44–1.00)
GFR calc Af Amer: 46 mL/min — ABNORMAL LOW (ref >60)
GFR calc non Af Amer: 39 mL/min — ABNORMAL LOW (ref >60)
Glucose, Bld: 174 mg/dL — ABNORMAL HIGH (ref 70–99)
Potassium: 5.9 mmol/L — ABNORMAL HIGH (ref 3.5–5.1)
Sodium: 132 mmol/L — ABNORMAL LOW (ref 135–145)

## 2019-11-02 LAB — CBC
HCT: 24.4 % — ABNORMAL LOW (ref 36.0–46.0)
Hemoglobin: 8.1 g/dL — ABNORMAL LOW (ref 12.0–15.0)
MCH: 34.5 pg — ABNORMAL HIGH (ref 26.0–34.0)
MCHC: 33.2 g/dL (ref 30.0–36.0)
MCV: 103.8 fL — ABNORMAL HIGH (ref 80.0–100.0)
Platelets: 255 10*3/uL (ref 150–400)
RBC: 2.35 MIL/uL — ABNORMAL LOW (ref 3.87–5.11)
RDW: 14.3 % (ref 11.5–15.5)
WBC: 7.3 10*3/uL (ref 4.0–10.5)
nRBC: 0 % (ref 0.0–0.2)

## 2019-11-02 LAB — GLUCOSE, CAPILLARY
Glucose-Capillary: 90 mg/dL (ref 70–99)
Glucose-Capillary: 111 mg/dL — ABNORMAL HIGH (ref 70–99)
Glucose-Capillary: 160 mg/dL — ABNORMAL HIGH (ref 70–99)
Glucose-Capillary: 162 mg/dL — ABNORMAL HIGH (ref 70–99)

## 2019-11-02 LAB — VITAMIN D 25 HYDROXY (VIT D DEFICIENCY, FRACTURES): Vit D, 25-Hydroxy: 30.31 ng/mL (ref 30–100)

## 2019-11-02 MED ORDER — SODIUM ZIRCONIUM CYCLOSILICATE 10 G PO PACK
10.0000 g | PACK | Freq: Two times a day (BID) | ORAL | Status: DC
  Administered 2019-11-02 – 2019-11-03 (×4): 10 g via ORAL
  Filled 2019-11-02 (×4): qty 1

## 2019-11-02 MED ORDER — DICLOFENAC SODIUM 1 % EX GEL
2.0000 g | Freq: Two times a day (BID) | CUTANEOUS | Status: DC
  Administered 2019-11-02 – 2019-11-05 (×6): 2 g via TOPICAL
  Filled 2019-11-02: qty 100

## 2019-11-02 NOTE — Progress Notes (Signed)
Progress Note    Kelly [name]  DTO:[id number] DOB: Nov 21, 1955  DOA: 10/21/2019 PCP: Arvilla Market, DO    Brief Narrative:    Medical records reviewed and are as summarized below:  Kelly [name] is an 64 y.o. female with history of COPD, tobacco abuse, chronic diastolic CHF, CAD/PCI and stents, hypertension, dyslipidemia presented to the emergency room 8/20 with worsening weakness, nausea, dysphagia and odynophagia, poor oral intake for over 2 months, this has been slowly progressive.  Work up including EGD/MBS showed candida and dysphagia.  G tube was placed for nutrition supplementation.  Slow to recover and safe d/c planning has been an issue.  Patient currently willing to consider SNF placement as I do not think she is safe at home.    Assessment/Plan:    Oropharyngeal and esophageal dysphagia Severe protein calorie malnutrition/weight loss and Adult failure to thrive -History of dysphagia and nausea progressively worsening for months, no overt findings concerning for malignancy based on CT chest/abdomen pelvis [facility] gastroenterology consulting, endoscopy 8/21 was unremarkable, follow-up biopsy, Ba esophagram w/out overt findings, limited evaluation of dysmotility due to patient's nausea and inability to stand -candida noted on path-treated with fluconazole -MBS: Dysphagia, pharyngoesophageal phase: dys 1 thin liquids -Prior history of cervical hardware/cervical spine surgery also contributing to dysphagia -TSH and random cortisol within normal limits -started marinol, scheduled zofran -Oral intake remains very poor and given her poor social support with essentially bedbound status after much discussion with patient, PEG tube was recommended per GI and placed by interventional radiology on 8/27 -Continue tube feeds and dysphagia one diet -Discharge planning, needs SNF  Hyponatremia -improved with less free water  Hyperkalemia Mild acute kidney injury Metabolic  acidosis -Likely from chronic diarrhea -Hydrated with IV fluids, add more free water -Add Lokelma -BMP in a.m.  aspiration pna/leukocytosis -left side infiltrate, cough and dyspnea in the setting of ongoing dysphagia -On day four of ceftriaxone/azithromycin, continue this for 2-3 more days  Chronic diarrhea -CT abdomen with questionable mild colitis, clinically do not suspect active colitis -Antibiotics discontinued, could be malabsorption related -C. 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 (Active)  10/22/19 0010  Location: Hip  Location Orientation: Left  Staging: Unstageable - Full thickness tissue loss in which the base of the injury is covered by slough (yellow, tan, gray, green or brown) and/or eschar (tan, brown or black) in the wound bed.  Wound Description (Comments):   Present on Admission: yes   Nutrition Status: Nutrition Problem: Moderate Malnutrition Etiology: chronic illness (dysphagia, esophagitis) Signs/Symptoms: mild fat depletion, mild muscle depletion, energy intake < or equal to 75% for > or equal to 1 month Interventions: Tube  feeding   Patient willing to consider SNF options   Family Communication/Anticipated D/C date and plan/Code Status  DVT prophylaxis: Lovenox ordered. Code Status: Full Code.  Family communication: Discussed with patient in detail, no family at bedside Disposition Plan: Status is: Inpatient  Remains inpatient appropriate because:Inpatient level of care appropriate due to severity of illness   Dispo: The patient is from: Home              Anticipated d/c is to: SNF              Anticipated d/c date is: 1 to 2 days              Patient currently is not medically stable to d/c. safe d/c plan         Medical Consultants:    GI- signed off Deboraha Sprang)  IR- placed g tube  Subjective:   -Now agreeable to rehab  Objective:    Vitals:   11/01/19 1735 11/01/19 2059 11/02/19 0624 11/02/19 0925  BP: (!) 128/48 (!) 133/40 (!) 146/51 (!) 147/50  Pulse: 71 66 72 70  Resp: 18 18 18 18   Temp: 98 F (36.7 C) 99.6 F (37.6 C) 98.9 F (37.2 C) 98.7 F (37.1 C)  TempSrc: Oral Oral Oral Oral  SpO2: 96% 91% 96% 91%  Weight:      Height:        Intake/Output Summary (Last 24 hours) at 11/02/2019 1552 Last data filed at 11/02/2019 1515 Gross per 24 hour  Intake 1535.18 ml  Output 2650 ml  Net -1114.82 ml   Filed Weights   10/24/19 1000 10/24/19 1938  Weight: 48.4 kg 49.1 kg    Exam:  Gen: Chronically ill elderly female appears much older than stated age HEENT: No JVD CVS: S1-S2, regular rate rhythm Lungs: Decreased breath sounds the bases Abdomen: Soft, nontender, bowel sounds present, PEG tube noted Extremities: No edema   Data Reviewed:   I have personally reviewed following labs and imaging studies:  Labs: Labs show the following:   Basic Metabolic Panel: Recent Labs  Lab 10/27/19 0150 10/28/19 0437 10/29/19 0418 10/29/19 0418 10/30/19 0201 10/30/19 0201 10/31/19 0546 10/31/19 0546 11/01/19 0743 11/02/19 0030  NA 135   < > 131*  --  129*  --  132*   --  134* 132*  K 3.3*   < > 3.6   < > 2.9*   < > 6.1*   < > 6.1* 5.9*  CL 107   < > 105  --  103  --  110  --  110 105  CO2 22   < > 19*  --  19*  --  16*  --  17* 19*  GLUCOSE 154*   < > 203*  --  189*  --  244*  --  87 174*  BUN 16   < > 18  --  20  --  27*  --  30* 28*  CREATININE 0.95   < > 1.11*  --  1.07*  --  1.40*  --  1.44* 1.39*  CALCIUM 7.4*   < > 7.6*  --  7.5*  --  7.7*  --  7.8* 7.6*  MG 1.8  --   --   --   --   --  2.6*  --   --   --   PHOS 2.6  --   --   --   --   --   --   --   --   --    < > = values in this interval  not displayed.   GFR Estimated Creatinine Clearance: 30.9 mL/min (A) (by C-G formula based on SCr of 1.39 mg/dL (H)). Liver Function Tests: No results for input(s): AST, ALT, ALKPHOS, BILITOT, PROT, ALBUMIN in the last 168 hours. No results for input(s): LIPASE, AMYLASE in the last 168 hours. No results for input(s): AMMONIA in the last 168 hours. Coagulation profile No results for input(s): INR, PROTIME in the last 168 hours.  CBC: Recent Labs  Lab 10/29/19 0418 10/30/19 0201 10/31/19 0546 11/01/19 0743 11/02/19 0030  WBC 13.1* 13.9* 9.0 7.0 7.3  HGB 10.6* 8.4* 7.8* 7.9* 8.1*  HCT 31.8* 24.8* 24.5* 24.2* 24.4*  MCV 102.6* 102.1* 105.2* 105.2* 103.8*  PLT 148* 149* 168 222 255   Cardiac Enzymes: No results for input(s): CKTOTAL, CKMB, CKMBINDEX, TROPONINI in the last 168 hours. BNP (last 3 results) No results for input(s): PROBNP in the last 8760 hours. CBG: Recent Labs  Lab 11/01/19 1108 11/01/19 1653 11/01/19 2058 11/02/19 0624 11/02/19 1137  GLUCAP 119* 140* 115* 90 111*   D-Dimer: No results for input(s): DDIMER in the last 72 hours. Hgb A1c: No results for input(s): HGBA1C in the last 72 hours. Lipid Profile: No results for input(s): CHOL, HDL, LDLCALC, TRIG, CHOLHDL, LDLDIRECT in the last 72 hours. Thyroid function studies: No results for input(s): TSH, T4TOTAL, T3FREE, THYROIDAB in the last 72 hours.  Invalid input(s):  FREET3 Anemia work up: Recent Labs    11/01/19 0525  VITAMINB12 137*   Sepsis Labs: Recent Labs  Lab 10/30/19 0201 10/31/19 0546 11/01/19 0743 11/02/19 0030  WBC 13.9* 9.0 7.0 7.3    Microbiology No results found for this or any previous visit (from the past 240 hour(s)).  Procedures and diagnostic studies:  No results found.  Medications:   . azithromycin  500 mg Oral Daily  . citalopram  20 mg Oral QHS  . collagenase   Topical Daily  . diclofenac Sodium  2 g Topical BID  . dronabinol  2.5 mg Oral BID AC  . enoxaparin (LOVENOX) injection  30 mg Subcutaneous Q24H  . feeding supplement (OSMOLITE 1.5 CAL)  300 mL Per Tube QID  . insulin aspart  0-15 Units Subcutaneous TID WC  . insulin aspart  0-5 Units Subcutaneous QHS  . insulin aspart  2 Units Subcutaneous Q4H  . levothyroxine  25 mcg Oral Q0600  . loperamide  2 mg Oral BID  . nicotine  21 mg Transdermal Daily  . ondansetron (ZOFRAN) IV  4 mg Intravenous TID AC  . pantoprazole  40 mg Oral Q1200  . pregabalin  75 mg Oral BID  . sodium zirconium cyclosilicate  10 g Oral BID   Continuous Infusions: . sodium chloride 50 mL/hr at 11/02/19 1253  . cefTRIAXone (ROCEPHIN)  IV 2 g (11/02/19 1059)     LOS: 11 days   Zannie Cove  [facility]  11/02/2019, 3:52 PM

## 2019-11-02 NOTE — Progress Notes (Signed)
Physical Therapy Treatment Patient Details Name: Kelly [name] MRN: [id number] DOB: 06/23/1955 Today's Date: 11/02/2019    History of Present Illness 64 yo female presenting to ED with weakness, nausea, and poor oral intake. CT chest concerning for interstitial lung disease and emphysema, CT abdomen suggestive of esophagitis and infectious/inflammatory colitis. PMH including COPD, tobacco abuse, chronic diastolic CHF, CAD/PCI and stents, HTN, and dyslipidemia.    PT Comments    Continuing work on functional mobility and activity tolerance;  Kelly [name] was very fatigued today, and experiencing diarrhea; Session was limited to assisting her with transfers to 3in1, and assisting with hygeine; continue to recommend SNF for post-acute rehab   Follow Up Recommendations  SNF;Supervision for mobility/OOB     Equipment Recommendations  Rolling walker with 5" wheels;3in1 (PT);Wheelchair (measurements PT)    Recommendations for Other Services       Precautions / Restrictions Precautions Precautions: Fall Precaution Comments: G-tube    Mobility  Bed Mobility Overal bed mobility: Needs Assistance Bed Mobility: Supine to Sit;Sit to Supine     Supine to sit: Mod assist Sit to supine: Mod assist   General bed mobility comments: Heavy Mod A to sit EOB, slow motor movements today  Transfers Overall transfer level: Needs assistance Equipment used: Rolling walker (2 wheeled);1 person hand held assist;2 person hand held assist Transfers: Sit to/from [employer] Sit to Stand: Mod assist;Max assist Stand pivot transfers: Mod assist;Max assist       General transfer comment: Heavy mod assist with bilateral support to power up to stand; initial attempt with RW, but unable to stand with stabiltiy; opted to perform more dependent transfer without RW, so knees could be blocked for stability; Pivoted with the help of PT and RN to transfer to and from [facility]   Ambulation/Gait                  Stairs             Wheelchair Mobility    Modified Rankin (Stroke Patients Only)       Balance     Sitting balance-Leahy Scale: Fair       Standing balance-Leahy Scale: Poor Standing balance comment: stood with bil supoprt at gait belt and knees blocked long enough to allow for cleanup post BM                            Cognition Arousal/Alertness: Awake/alert Behavior During Therapy: Flat affect Overall Cognitive Status: No family/caregiver present to determine baseline cognitive functioning                                 General Comments: Pt with flat affect, decreased responses to questions but willing to attempt tasks with PT      Exercises      General Comments General comments (skin integrity, edema, etc.): persistent diarrhea during session; opted to get back to bed      Pertinent Vitals/Pain Pain Assessment: Faces Faces Pain Scale: Hurts little more Pain Location: stomach Pain Descriptors / Indicators: Grimacing;Sore Pain Intervention(s): Monitored during session;Repositioned    Home Living                      Prior Function            PT Goals (current goals can now be found in the care plan section)  Acute Rehab PT Goals Patient Stated Goal: Agrees to getting to [facility] to move bowels PT Goal Formulation: With patient Time For Goal Achievement: 11/05/19 Potential to Achieve Goals: Good Progress towards PT goals: Not progressing toward goals - comment (fatigue and diarrhea today)    Frequency    Min 2X/week (per dept protocol)      PT Plan Discharge plan needs to be updated;Frequency needs to be updated    Co-evaluation              AM-PAC PT "6 Clicks" Mobility   Outcome Measure  Help needed turning from your back to your side while in a flat bed without using bedrails?: A Little Help needed moving from lying on your back to sitting on the side of a flat bed without using  bedrails?: A Lot Help needed moving to and from a bed to a chair (including a wheelchair)?: A Lot Help needed standing up from a chair using your arms (e.g., wheelchair or bedside chair)?: A Lot Help needed to walk in hospital room?: A Lot Help needed climbing 3-5 steps with a railing? : Total 6 Click Score: 12    End of Session Equipment Utilized During Treatment: Gait belt Activity Tolerance: Patient limited by fatigue Patient left: in bed Nurse Communication: Mobility status PT Visit Diagnosis: Muscle weakness (generalized) (M62.81);Difficulty in walking, not elsewhere classified (R26.2);History of falling (Z91.81)     Time: 8315-1761 PT Time Calculation (min) (ACUTE ONLY): 24 min  Charges:  $Therapeutic Activity: 23-37 mins                     Van Clines, PT  Acute Rehabilitation Services Pager [phone] Office [phone]    Kelly [name] 11/02/2019, 2:21 PM

## 2019-11-03 LAB — CBC
HCT: 24.3 % — ABNORMAL LOW (ref 36.0–46.0)
Hemoglobin: 8 g/dL — ABNORMAL LOW (ref 12.0–15.0)
MCH: 34 pg (ref 26.0–34.0)
MCHC: 32.9 g/dL (ref 30.0–36.0)
MCV: 103.4 fL — ABNORMAL HIGH (ref 80.0–100.0)
Platelets: 283 10*3/uL (ref 150–400)
RBC: 2.35 MIL/uL — ABNORMAL LOW (ref 3.87–5.11)
RDW: 14.1 % (ref 11.5–15.5)
WBC: 7.5 10*3/uL (ref 4.0–10.5)
nRBC: 0 % (ref 0.0–0.2)

## 2019-11-03 LAB — BASIC METABOLIC PANEL
Anion gap: 8 (ref 5–15)
BUN: 28 mg/dL — ABNORMAL HIGH (ref 8–23)
CO2: 20 mmol/L — ABNORMAL LOW (ref 22–32)
Calcium: 7.6 mg/dL — ABNORMAL LOW (ref 8.9–10.3)
Chloride: 106 mmol/L (ref 98–111)
Creatinine, Ser: 1.24 mg/dL — ABNORMAL HIGH (ref 0.44–1.00)
GFR calc Af Amer: 52 mL/min — ABNORMAL LOW (ref >60)
GFR calc non Af Amer: 45 mL/min — ABNORMAL LOW (ref >60)
Glucose, Bld: 137 mg/dL — ABNORMAL HIGH (ref 70–99)
Potassium: 5.2 mmol/L — ABNORMAL HIGH (ref 3.5–5.1)
Sodium: 134 mmol/L — ABNORMAL LOW (ref 135–145)

## 2019-11-03 LAB — RETICULOCYTES
Immature Retic Fract: 13 % (ref 2.3–15.9)
RBC.: 2.4 MIL/uL — ABNORMAL LOW (ref 3.87–5.11)
Retic Count, Absolute: 25.7 10*3/uL (ref 19.0–186.0)
Retic Ct Pct: 1.1 % (ref 0.4–3.1)

## 2019-11-03 LAB — GLUCOSE, CAPILLARY
Glucose-Capillary: 93 mg/dL (ref 70–99)
Glucose-Capillary: 153 mg/dL — ABNORMAL HIGH (ref 70–99)
Glucose-Capillary: 99 mg/dL (ref 70–99)
Glucose-Capillary: 118 mg/dL — ABNORMAL HIGH (ref 70–99)

## 2019-11-03 LAB — IRON AND TIBC
Iron: 24 ug/dL — ABNORMAL LOW (ref 28–170)
Saturation Ratios: 17 % (ref 10.4–31.8)
TIBC: 139 ug/dL — ABNORMAL LOW (ref 250–450)
UIBC: 115 ug/dL

## 2019-11-03 LAB — FERRITIN: Ferritin: 256 ng/mL (ref 11–307)

## 2019-11-03 LAB — FOLATE: Folate: 3.6 ng/mL — ABNORMAL LOW (ref >5.9)

## 2019-11-03 LAB — VITAMIN B12: Vitamin B-12: 3593 pg/mL — ABNORMAL HIGH (ref 180–914)

## 2019-11-03 MED ORDER — SODIUM CHLORIDE 0.9 % IV SOLN: INTRAVENOUS | Status: AC

## 2019-11-03 MED ORDER — LIDOCAINE 5 % EX PTCH
1.0000 | MEDICATED_PATCH | CUTANEOUS | Status: DC
  Administered 2019-11-03 – 2019-11-04 (×2): 1 via TRANSDERMAL
  Filled 2019-11-03 (×2): qty 1

## 2019-11-03 MED ORDER — SODIUM CHLORIDE 0.9 % IV SOLN
510.0000 mg | Freq: Once | INTRAVENOUS | Status: AC
  Administered 2019-11-03: 510 mg via INTRAVENOUS
  Filled 2019-11-03: qty 17

## 2019-11-03 MED ORDER — ENOXAPARIN SODIUM 40 MG/0.4ML ~~LOC~~ SOLN
40.0000 mg | SUBCUTANEOUS | Status: DC
  Administered 2019-11-03 – 2019-11-04 (×2): 40 mg via SUBCUTANEOUS
  Filled 2019-11-03 (×2): qty 0.4

## 2019-11-03 MED ORDER — FREE WATER
250.0000 mL | Freq: Four times a day (QID) | Status: DC
  Administered 2019-11-03 – 2019-11-05 (×8): 250 mL

## 2019-11-03 NOTE — TOC Benefit Eligibility Note (Signed)
Transition of Care [facility]) Benefit Eligibility Note    Patient Details  Name: Kelly [name] MRN: [id number] Date of Birth: 02-15-1956   Medication/Dose: [name] $RemoveB'5mg'RdRVJCJu$ . daily  Covered?: Yes  Tier: 3 Drug  Prescription Coverage [facility]  Spoke with Person/Company/Phone Number:: Wyatt Portela A. W/[employer] Help Desk LF#[phone]  Co-Pay: $4.00  Prior Approval: No  Deductible: Met       [name] Phone Number: 11/03/2019, 3:24 PM

## 2019-11-03 NOTE — [place] FL2 (Signed)
[place] MEDICAID FL2 LEVEL OF CARE SCREENING TOOL     IDENTIFICATION  Patient Name: Kelly [name] Birthdate: Jan 05, 1956 Sex: female Admission Date (Current Location): 10/21/2019  [place] and [place] Number:  Haynes Bast [id number] Q Facility and Address:  [facility] [place], [address], [place], [place] [address]      Provider Number: [id number]  Attending Physician Name and Address:  Zannie Cove, MD  Relative Name and Phone Number:  Paelyn Smick - son [phone]    Current Level of Care: Hospital Recommended Level of Care: Skilled Nursing Facility Prior Approval Number:    Date Approved/Denied:   PASRR Number: [id number]  Discharge Plan: SNF    Current Diagnoses: Patient Active Problem List   Diagnosis Date Noted   Malnutrition of moderate degree 10/27/2019   Dysphagia 10/21/2019   Esophagitis 10/21/2019   Unintentional weight loss 10/21/2019   Colitis 10/21/2019   Hypokalemia 10/21/2019   Abnormality of gait 12/28/2018   History of falling 12/28/2018   Wheelchair dependent 12/28/2018   Scoliosis (and kyphoscoliosis), idiopathic 12/28/2018   HTN (hypertension) 12/08/2018   COPD (chronic obstructive pulmonary disease) (HCC) 12/08/2018   Migraine with aura and without status migrainosus, not intractable 11/30/2018   Coronary artery disease involving native coronary artery of native heart without angina pectoris 11/30/2018   Functional fecal incontinence 11/30/2018   Functional urinary incontinence 11/30/2018   Fibromyalgia 11/30/2018   Chest pain 05/02/2018    Orientation RESPIRATION BLADDER Height & Weight     Self, Time  Normal Incontinent, External catheter (Catheter placed 10/21/19) Weight: 128 lb 1.4 oz (58.1 kg) Height:  5\' 5"  (165.1 cm)  BEHAVIORAL SYMPTOMS/MOOD NEUROLOGICAL BOWEL NUTRITION STATUS      Continent Diet, Feeding tube (Gastrostomy (PEG) tube. Bolus TF via PEG-Osmolite 1.5 - 300 mL QID. Placed 10/28/19;  DYS 1 thin liquid diet)  AMBULATORY STATUS COMMUNICATION OF NEEDS Skin   Extensive Assist Verbally Other (Comment) (Unstageablew pressure injury to left hip-cleansed with soap & water-thin layer of santyl applied to nonviable tissue & top with saline dampened gauze 2/2, then dry guaze & secure with silicone foam. Change daily; Ecchymosis bilateral arms)                       Personal Care Assistance Level of Assistance  Bathing, Feeding, Dressing Bathing Assistance: Limited assistance (Upper body assistance with set-up; Lower body min assist) Feeding assistance: Limited assistance (Assistance with set-up) Dressing Assistance: Maximum assistance (Upper body supervision; Lower body mod assist)     Functional Limitations Info  Sight, Hearing, Speech Sight Info: Impaired Hearing Info: Adequate Speech Info: Adequate    SPECIAL CARE FACTORS FREQUENCY  PT (By licensed PT), OT (By licensed OT), Speech therapy     PT Frequency: Evaluated 8/21. PT at [facility] Eval and Treat, a minimum of 5 times per week OT Frequency: Evaluated 8/21. OT at [facility] Eval and Treat, a minimum of 5 times per week     Speech Therapy Frequency: Evaluated 8/22. DYS 1 - thin liquid, puree diet      Contractures Contractures Info: Not present    Additional Factors Info  Code Status, Allergies, Insulin Sliding Scale Code Status Info: Full Allergies Info: Morphine and Related   Insulin Sliding Scale Info: 0-5 Units daily at bedtime; 0-15 Units 3 times per day with meals       Current Medications (11/03/2019):  This is the current hospital active medication list Current Facility-Administered Medications  Medication Dose Route Frequency  Provider Last Rate Last Admin   0.9 %  sodium chloride infusion   Intravenous Continuous Zannie Cove, MD 50 mL/hr at 11/03/19 1046 New Bag at 11/03/19 1046   acetaminophen (TYLENOL) tablet 650 mg  650 mg Oral Q6H PRN Buccini, Molly Maduro, MD   650 mg at 11/02/19 1748   Or    acetaminophen (TYLENOL) suppository 650 mg  650 mg Rectal Q6H PRN Buccini, Robert, MD       cefTRIAXone (ROCEPHIN) 2 g in sodium chloride 0.9 % 100 mL IVPB  2 g Intravenous Q24H Vann, Jessica U, DO 200 mL/hr at 11/03/19 1052 2 g at 11/03/19 1052   citalopram (CELEXA) tablet 20 mg  20 mg Oral QHS Vann, Jessica U, DO   20 mg at 11/02/19 2149   collagenase (SANTYL) ointment   Topical Daily Zannie Cove, MD   Given at 11/03/19 1053   diclofenac Sodium (VOLTAREN) 1 % topical gel 2 g  2 g Topical BID Zannie Cove, MD   2 g at 11/03/19 1047   dronabinol (MARINOL) capsule 2.5 mg  2.5 mg Oral BID Verneda Skill, MD   2.5 mg at 11/03/19 1251   enoxaparin (LOVENOX) injection 30 mg  30 mg Subcutaneous Q24H Pierce, Dwayne A, RPH   30 mg at 11/02/19 2149   feeding supplement (OSMOLITE 1.5 CAL) liquid 300 mL  300 mL Per Tube QID Marlin Canary U, DO   300 mL at 11/03/19 1253   free water 250 mL  250 mL Per Tube QID Zannie Cove, MD   250 mL at 11/03/19 1306   insulin aspart (novoLOG) injection 0-15 Units  0-15 Units Subcutaneous TID WC Vann, Jessica U, DO   3 Units at 11/03/19 1254   insulin aspart (novoLOG) injection 0-5 Units  0-5 Units Subcutaneous QHS Vann, Jessica U, DO   2 Units at 10/30/19 2300   insulin aspart (novoLOG) injection 2 Units  2 Units Subcutaneous Q4H Marlin Canary U, DO   2 Units at 11/03/19 1251   levothyroxine (SYNTHROID) tablet 25 mcg  25 mcg Oral Q0600 Norva Pavlov, RPH   25 mcg at 11/03/19 2458   lidocaine (LIDODERM) 5 % 1 patch  1 patch Transdermal Q24H Zannie Cove, MD       loperamide (IMODIUM) capsule 2 mg  2 mg Oral PRN Buccini, Robert, MD   2 mg at 11/02/19 1225   loperamide (IMODIUM) capsule 2 mg  2 mg Oral BID Vann, Jessica U, DO   2 mg at 11/03/19 1043   nicotine (NICODERM CQ - dosed in mg/24 hours) patch 21 mg  21 mg Transdermal Daily Buccini, Robert, MD   21 mg at 11/02/19 1100   ondansetron (ZOFRAN) injection 4 mg  4 mg Intravenous TID  Verneda Skill, MD   4 mg at 11/03/19 1250   pantoprazole (PROTONIX) EC tablet 40 mg  40 mg Oral Q1200 Zannie Cove, MD   40 mg at 11/03/19 1251   pregabalin (LYRICA) capsule 75 mg  75 mg Oral BID Marlin Canary U, DO   75 mg at 11/03/19 1043   sodium zirconium cyclosilicate (LOKELMA) packet 10 g  10 g Oral BID Leander Rams, RPH   10 g at 11/03/19 1133     Discharge Medications: Please see discharge summary for a list of discharge medications.  Relevant Imaging Results:  Relevant Lab Results:   Additional Information ss# [id number]; Uses a prevalon boot; Left toe amputation  Okey Dupre Lazaro Arms, LCSW

## 2019-11-03 NOTE — Plan of Care (Signed)
  Problem: Activity: Goal: Risk for activity intolerance will decrease Outcome: Progressing   

## 2019-11-03 NOTE — Progress Notes (Signed)
  Speech Language Pathology Treatment: Dysphagia  Patient Details Name: Elexis Pollak MRN: [id number] DOB: Mar 12, 1955 Today's Date: 11/03/2019 Time: 5885-0277 SLP Time Calculation (min) (ACUTE ONLY): 20 min  Assessment / Plan / Recommendation Clinical Impression  Pt was seen at bedside for skilled ST intervention targeting goals for education regarding safe swallow strategies. Pt was observed during lunch. Upon arrival of SLP, pt was noted to be reclined in bed, feeding herself. RN assisted to reposition pt more upright. Pt was then able to self feed puree textures and drink thin liquids via straw. RN reported no difficulty with whole meds given with water. Safe swallow precautions were updated and posted at [facility].  Pt exhibited both pharyngeal and esophageal dysphagia on recent instrumental tests (BaSw 8/23, MBS 8/24). Will continue puree diet and thin liquids, with recommendation to clear throat intermittently, sit as upright as tolerated during meals, and for 30 minutes afterward. Precautions posted at [facility] and discussed with pt and RN. SLP will continue to follow to address safe swallow and reiterate education.   HPI HPI: Kaysee Hergert is a 64 y.o. female with medical history significant of CAD status post PCI, chronic diastolic CHF, hypertension, hyperlipidemia presenting with complaints of generalized weakness, nausea, dysphagia, and poor oral intake. Patient states she has had difficulty swallowing for at least 2 months now and she has been losing a lot of weight. Both food and liquid gets stuck when she tries to swallow and it is painful. States she has lost at least 20 to 25 pounds. She feels nauseous but is not vomiting. She is hardly able to eat anything and when she does, she experiences diarrhea. Denies abdominal pain. Reports having a chronic cough due to history of smoking. She smokes 1 pack of cigarettes daily. Denies fevers, shortness of breath, or chest pain. She has not been vaccinated against  Covid.  CT of the abdomen and pelvis with findings suggestive of esophagitis and infection or inflammatory colitis. PEG tube was placed on 10/28/2019.       SLP Plan  Continue with current plan of care       Recommendations  Diet recommendations: Dysphagia 1 (puree);Thin liquid Liquids provided via: Straw Medication Administration: Whole meds with liquid Supervision: Patient able to self feed;Intermittent supervision to cue for compensatory strategies Compensations: Slow rate;Small sips/bites;Follow solids with liquid;Multiple dry swallows after each bite/sip;Clear throat intermittently Postural Changes and/or Swallow Maneuvers: Seated upright 90 degrees;Upright 30-60 min after meal                Oral Care Recommendations: Oral care BID Follow up Recommendations: Home health SLP SLP Visit Diagnosis: Dysphagia, pharyngoesophageal phase (R13.14) Plan: Continue with current plan of care       GO           Hajar Penninger B. Murvin Natal, [facility], CCC-SLP Speech Language Pathologist Office: [phone]  Leigh Aurora 11/03/2019, 1:30 PM

## 2019-11-03 NOTE — Progress Notes (Signed)
Nutrition Follow-up  DOCUMENTATION CODES:   Non-severe (moderate) malnutrition in context of chronic illness  INTERVENTION:  Continue bolus TF via PEG: Osmolite 1.5 300 ml QID  Provides 1800 kcal, 75 gm protein, 914 ml free water daily.   NUTRITION DIAGNOSIS:   Moderate Malnutrition related to chronic illness (dysphagia, esophagitis) as evidenced by mild fat depletion, mild muscle depletion, energy intake < or equal to 75% for > or equal to 1 month.  Ongoing  GOAL:   Patient will meet greater than or equal to 90% of their needs  Met with TF  MONITOR:   TF tolerance, Weight trends, Labs, I & O's  REASON FOR ASSESSMENT:   Malnutrition Screening Tool    ASSESSMENT:   Pt with a PMH significant for COPD, tobacco abuse, chronic diastolic CHF, CAD/PCI and stents, hypertension, dyslipidemia presented to the emergency room 8/20 with worsening weakness, nausea, dysphagia and odynophagia, poor oral intake for over 2 months, this has been slowly progressive.  Work up including EGD/MBS showed candida and dysphagia.  G tube was placed for nutrition supplementation.  Slow to recover and safe d/c planning has been an issue.  8/27 PEG placement; diet advanced to dysphagia 1 diet with thin liquids  Per MD, pt now agreeable to rehab.  Discussed pt with RN who reports pt not tolerating bolus feeds well. Discovered that RN had been providing Osmolite 1.5 cal orally since pt's diet had advanced and pt was able to swallow medications. Explained why administration needs to be via tube. Suspect pt will tolerate boluses from this point on. Will continue to monitor for adequacy of PO intake and will make adjustments to TF regimen as appropriate.   Current TF: 391m Osmolite 1.5 cal QID  PO Intake: 0-50% x last 8 recorded meals (23% average meal intake)  Labs: Na 134(L), K+ 5.2 (H), CBGs 93-162 Medications: Marinol, Imodium, Novolog, Zofran, Protonix, Lokelma IVF: NS @ 550mhr  Diet Order:    Diet Order            DIET - DYS 1 Room service appropriate? Yes; Fluid consistency: Thin  Diet effective now                 EDUCATION NEEDS:   Education needs have been addressed  Skin:  Skin Assessment: Skin Integrity Issues: Skin Integrity Issues:: Unstageable Unstageable: L hip  Last BM:  8/30  Height:   Ht Readings from Last 1 Encounters:  10/22/19 _0  (1.651 m)    Weight:   Wt Readings from Last 1 Encounters:  11/02/19 58.1 kg    BMI:  Body mass index is 21.31 kg/m.  Estimated Nutritional Needs:   Kcal:  1700-1900  Protein:  80-90g  Fluid:  1.7L/day    AmLarkin InaMS, RD, LDN RD pager number and weekend/on-call pager number located in [place]

## 2019-11-03 NOTE — Progress Notes (Signed)
Progress Note    Kelly [name]  PYP:[id number] DOB: Nov 05, 1955  DOA: 10/21/2019 PCP: Arvilla Market, DO    Brief Narrative:    Medical records reviewed and are as summarized below:  Kelly [name] is an 64 y.o. female with history of COPD, tobacco abuse, chronic diastolic CHF, CAD/PCI and stents, hypertension, dyslipidemia presented to the emergency room 8/20 with worsening weakness, nausea, dysphagia and odynophagia, poor oral intake for over 2 months, this has been slowly progressive.  Work up including EGD/MBS showed candida and dysphagia.  G tube was placed for nutrition supplementation.  Slow to recover and safe d/c planning has been an issue.  Patient currently willing to consider SNF placement  Assessment/Plan:    Severe oropharyngeal and esophageal dysphagia Severe protein calorie malnutrition/weight loss Adult failure to thrive -History of dysphagia and nausea progressively worsening for months, no overt findings on CT chest/abdomen pelvis [facility] gastroenterology consulting, endoscopy 8/21 was unremarkable, follow-up biopsy, Ba esophagram w/out overt findings, limited evaluation of dysmotility -candida noted on path-treated with fluconazole -MBS: Dysphagia, pharyngoesophageal phase: dys 1 thin liquids -Prior history of cervical hardware/cervical spine surgery also contributing to dysphagia -TSH and random cortisol within normal limits -started marinol, scheduled zofran -Oral intake remains very poor and given her poor social support with essentially bedbound status after much discussion with patient, PEG tube was recommended per GI and placed by interventional radiology on 8/27 -Continue tube feeds and dysphagia 1 diet -Discharge planning, needs SNF -social work Catering manager, stable now  Hyponatremia -improved with free water  Hyperkalemia Mild acute kidney injury Metabolic acidosis -Likely from chronic diarrhea -Hydrated with IV fluids, add more free  water -improving, continue lokelma today  Aspiration pna/leukocytosis -left side infiltrate, cough and dyspnea in the setting of ongoing dysphagia -On day 5 of ceftriaxone/azithromycin, continue this for 2 more days  Chronic diarrhea -CT abdomen with questionable mild colitis, clinically do not suspect active colitis -Antibiotics discontinued, could be malabsorption related -C. 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 (Active)  10/22/19 0010  Location: Hip  Location Orientation: Left  Staging: Unstageable - Full thickness tissue loss in which the base of the injury is covered by slough (yellow, tan, gray, green or brown) and/or eschar (tan, brown or black) in the wound bed.  Wound Description (Comments):   Present on Admission: yes   Nutrition Status: Nutrition Problem: Moderate Malnutrition Etiology: chronic illness (dysphagia, esophagitis) Signs/Symptoms: mild fat depletion, mild muscle depletion, energy intake < or equal to 75% for > or equal to 1 month Interventions: Tube feeding   Patient willing to consider SNF  options   Family Communication/Anticipated D/C date and plan/Code Status   DVT prophylaxis: Lovenox ordered. Code Status: Full  Code.  Family communication: Discussed with patient in detail, no family at bedside Disposition Plan: Status is: Inpatient  Remains inpatient appropriate because:Inpatient level of care appropriate due to severity of illness   Dispo: The patient is from: Home              Anticipated d/c is to: SNF              Anticipated d/c date is: 1 to 2 days when SNF bed is available              Patient currently is medically stable to d/c.         Medical Consultants:    GI- -[facility])  IR- placed g tube  Subjective:   -No events overnight, tolerating tube feeds  Objective:    Vitals:   11/02/19 0925 11/02/19 1630 11/02/19 2013 11/03/19 0521  BP: (!) 147/50 (!) 143/41 (!) 133/40 (!) 133/43  Pulse: 70 68 71 (!) 59  Resp: 18 18 18 16   Temp: 98.7 F (37.1 C) 98.7 F (37.1 C) 98.5 F (36.9 C) 97.7 F (36.5 C)  TempSrc: Oral Oral Oral Oral  SpO2: 91% 94% (!) 88% 92%  Weight:   58.1 kg   Height:        Intake/Output Summary (Last 24 hours) at 11/03/2019 1344 Last data filed at 11/03/2019 1302 Gross per 24 hour  Intake 2503.83 ml  Output 1825 ml  Net 678.83 ml   Filed Weights   10/24/19 1000 10/24/19 1938 11/02/19 2013  Weight: 48.4 kg 49.1 kg 58.1 kg    Exam:  Gen: Chronically ill elderly female sitting up in bed, appears much older than stated age, awake alert oriented x3 HEENT: No JVD CVS: S1-S2, regular rate rhythm Lungs: Decreased breath sounds bases Abdomen: Soft, nontender, bowel sounds present, PEG tube noted Extremities: No edema   Data Reviewed:   I have personally reviewed following labs and imaging studies:  Labs: Labs show the following:   Basic Metabolic Panel: Recent Labs  Lab 10/30/19 0201 10/30/19 0201 10/31/19 0546 10/31/19 0546 11/01/19 0743 11/01/19 0743 11/02/19 0030 11/03/19 0424  NA 129*  --  132*   --  134*  --  132* 134*  K 2.9*   < > 6.1*   < > 6.1*   < > 5.9* 5.2*  CL 103  --  110  --  110  --  105 106  CO2 19*  --  16*  --  17*  --  19* 20*  GLUCOSE 189*  --  244*  --  87  --  174* 137*  BUN 20  --  27*  --  30*  --  28* 28*  CREATININE 1.07*  --  1.40*  --  1.44*  --  1.39* 1.24*  CALCIUM 7.5*  --  7.7*  --  7.8*  --  7.6* 7.6*  MG  --   --  2.6*  --   --   --   --   --    < > = values in this interval not displayed.   GFR Estimated Creatinine Clearance: 40.2 mL/min (A) (by C-G formula based on SCr of 1.24 mg/dL (H)). Liver Function Tests: No results for input(s): AST, ALT, ALKPHOS, BILITOT, PROT, ALBUMIN in the last 168 hours. No results for input(s): LIPASE, AMYLASE in the last 168 hours. No results for input(s): AMMONIA in the last 168 hours. Coagulation profile No results for input(s): INR, PROTIME in the  last 168 hours.  CBC: Recent Labs  Lab 10/30/19 0201 10/31/19 0546 11/01/19 0743 11/02/19 0030 11/03/19 0424  WBC 13.9* 9.0 7.0 7.3 7.5  HGB 8.4* 7.8* 7.9* 8.1* 8.0*  HCT 24.8* 24.5* 24.2* 24.4* 24.3*  MCV 102.1* 105.2* 105.2* 103.8* 103.4*  PLT 149* 168 222 255 283   Cardiac Enzymes: No results for input(s): CKTOTAL, CKMB, CKMBINDEX, TROPONINI in the last 168 hours. BNP (last 3 results) No results for input(s): PROBNP in the last 8760 hours. CBG: Recent Labs  Lab 11/02/19 1137 11/02/19 1630 11/02/19 2030 11/03/19 0647 11/03/19 1119  GLUCAP 111* 160* 162* 93 153*   D-Dimer: No results for input(s): DDIMER in the last 72 hours. Hgb A1c: No results for input(s): HGBA1C in the last 72 hours. Lipid Profile: No results for input(s): CHOL, HDL, LDLCALC, TRIG, CHOLHDL, LDLDIRECT in the last 72 hours. Thyroid function studies: No results for input(s): TSH, T4TOTAL, T3FREE, THYROIDAB in the last 72 hours.  Invalid input(s): FREET3 Anemia work up: Recent Labs    11/01/19 0525 11/03/19 0424  VITAMINB12 137* 3,593*  FOLATE  --  3.6*  FERRITIN  --   256  TIBC  --  139*  IRON  --  24*  RETICCTPCT  --  1.1   Sepsis Labs: Recent Labs  Lab 10/31/19 0546 11/01/19 0743 11/02/19 0030 11/03/19 0424  WBC 9.0 7.0 7.3 7.5    Microbiology No results found for this or any previous visit (from the past 240 hour(s)).  Procedures and diagnostic studies:  No results found.  Medications:   . azithromycin  500 mg Oral Daily  . citalopram  20 mg Oral QHS  . collagenase   Topical Daily  . diclofenac Sodium  2 g Topical BID  . dronabinol  2.5 mg Oral BID AC  . enoxaparin (LOVENOX) injection  30 mg Subcutaneous Q24H  . feeding supplement (OSMOLITE 1.5 CAL)  300 mL Per Tube QID  . free water  250 mL Per Tube QID  . insulin aspart  0-15 Units Subcutaneous TID WC  . insulin aspart  0-5 Units Subcutaneous QHS  . insulin aspart  2 Units Subcutaneous Q4H  . levothyroxine  25 mcg Oral Q0600  . lidocaine  1 patch Transdermal Q24H  . loperamide  2 mg Oral BID  . nicotine  21 mg Transdermal Daily  . ondansetron (ZOFRAN) IV  4 mg Intravenous TID AC  . pantoprazole  40 mg Oral Q1200  . pregabalin  75 mg Oral BID  . sodium zirconium cyclosilicate  10 g Oral BID   Continuous Infusions: . sodium chloride 50 mL/hr at 11/03/19 1046  . cefTRIAXone (ROCEPHIN)  IV 2 g (11/03/19 1052)     LOS: 12 days   Zannie Cove  [facility]  11/03/2019, 1:44 PM

## 2019-11-04 ENCOUNTER — Inpatient Hospital Stay: Payer: Medicare HMO

## 2019-11-04 DIAGNOSIS — Z23 Encounter for immunization: Secondary | ICD-10-CM

## 2019-11-04 LAB — GLUCOSE, CAPILLARY
Glucose-Capillary: 85 mg/dL (ref 70–99)
Glucose-Capillary: 160 mg/dL — ABNORMAL HIGH (ref 70–99)
Glucose-Capillary: 138 mg/dL — ABNORMAL HIGH (ref 70–99)
Glucose-Capillary: 121 mg/dL — ABNORMAL HIGH (ref 70–99)

## 2019-11-04 LAB — BASIC METABOLIC PANEL
Anion gap: 5 (ref 5–15)
BUN: 19 mg/dL (ref 8–23)
CO2: 21 mmol/L — ABNORMAL LOW (ref 22–32)
Calcium: 7.8 mg/dL — ABNORMAL LOW (ref 8.9–10.3)
Chloride: 111 mmol/L (ref 98–111)
Creatinine, Ser: 1.04 mg/dL — ABNORMAL HIGH (ref 0.44–1.00)
GFR calc Af Amer: >60 mL/min (ref >60)
GFR calc non Af Amer: 56 mL/min — ABNORMAL LOW (ref >60)
Glucose, Bld: 98 mg/dL (ref 70–99)
Potassium: 4.3 mmol/L (ref 3.5–5.1)
Sodium: 137 mmol/L (ref 135–145)

## 2019-11-04 LAB — SARS CORONAVIRUS 2 BY RT PCR (HOSPITAL ORDER, PERFORMED IN ~~LOC~~ HOSPITAL LAB): SARS Coronavirus 2: NEGATIVE

## 2019-11-04 MED ORDER — ACETAMINOPHEN 325 MG PO TABS: 650.0000 mg | ORAL_TABLET | Freq: Four times a day (QID) | ORAL | Status: DC | PRN

## 2019-11-04 MED ORDER — LOPERAMIDE HCL 2 MG PO CAPS: 2.0000 mg | ORAL_CAPSULE | Freq: Two times a day (BID) | ORAL | 0 refills | Status: AC | PRN

## 2019-11-04 MED ORDER — OSMOLITE 1.5 CAL PO LIQD: 300.0000 mL | Freq: Four times a day (QID) | ORAL | 0 refills | Status: AC

## 2019-11-04 MED ORDER — DICLOFENAC SODIUM 1 % EX GEL: 2.0000 g | Freq: Four times a day (QID) | CUTANEOUS | Status: AC

## 2019-11-04 MED ORDER — LIDOCAINE 5 % EX PTCH: 1.0000 | MEDICATED_PATCH | CUTANEOUS | 0 refills | Status: DC

## 2019-11-04 MED ORDER — FREE WATER: 250.0000 mL | Freq: Four times a day (QID) | Status: AC

## 2019-11-04 MED ORDER — LOPERAMIDE HCL 2 MG PO CAPS: 2.0000 mg | ORAL_CAPSULE | Freq: Two times a day (BID) | ORAL | 0 refills | Status: DC

## 2019-11-04 NOTE — Progress Notes (Signed)
Occupational Therapy Treatment Patient Details Name: Kelly [name] MRN: [id number] DOB: 03/13/1955 Today's Date: 11/04/2019    History of present illness 64 yo female presenting to ED with weakness, nausea, and poor oral intake. CT chest concerning for interstitial lung disease and emphysema, CT abdomen suggestive of esophagitis and infectious/inflammatory colitis. PMH including COPD, tobacco abuse, chronic diastolic CHF, CAD/PCI and stents, HTN, and dyslipidemia.   OT comments  This 64 yo female admitted with above presents to acute OT with making progress with bed mobility, stand pivots, being able to sit EOB and two grooming tasks without LOB and tolerated activity. Her only complaint today was low back pain (RN called for pain meds). She will continue to benefit from acute OT with follow up at SNF. Goals updated today due to update due on 9/4.  Follow Up Recommendations  SNF;Supervision/Assistance - 24 hour;Other (comment) (max HH services if pt goes home)    Equipment Recommendations  3 in 1 bedside commode;Other (comment) (RW)       Precautions / Restrictions Precautions Precautions: Fall Precaution Comments: G-tube Restrictions Weight Bearing Restrictions: No       Mobility Bed Mobility Overal bed mobility: Needs Assistance Bed Mobility: Supine to Sit     Supine to sit: Min assist     General bed mobility comments: Pt able to move legs off of bed and reached without cues for bed rail with Bil UEs, started up with trunk but then needed a little help  Transfers Overall transfer level: Needs assistance Equipment used: Rolling walker (2 wheeled) Transfers: Sit to/from [employer] Sit to Stand: Mod assist Stand pivot transfers: Min assist            Balance Overall balance assessment: Needs assistance Sitting-balance support: No upper extremity supported;Feet supported Sitting balance-Leahy Scale: Good Sitting balance - Comments: sat EOB without support  (washed her face and brushed her hair)   Standing balance support: Bilateral upper extremity supported Standing balance-Leahy Scale: Poor Standing balance comment: reliant on RW and additional external support                           ADL either performed or assessed with clinical judgement   ADL Overall ADL's : Needs assistance/impaired     Grooming: Set up;Supervision/safety;Sitting;Wash/dry face;Brushing hair Grooming Details (indicate cue type and reason): seated EOB                   Toilet Transfer Details (indicate cue type and reason): simulated to recliner with RW (Mod A sit<>stand from bed, min A stand pivot, min A stand>sit with pt spontaneously reaching back from arm of recliner on left prior to sitting.                 Vision Baseline Vision/History: Macular Degeneration Patient Visual Report: No change from baseline            Cognition Arousal/Alertness: Awake/alert Behavior During Therapy: Flat affect Overall Cognitive Status: No family/caregiver present to determine baseline cognitive functioning                                 General Comments: Slow to respond to questions at times                   Pertinent Vitals/ Pain       Pain Assessment: 0-10 Pain Score: 9  Pain  Location: lower back Pain Descriptors / Indicators: Aching;Sore Pain Intervention(s): Limited activity within patient's tolerance;Monitored during session;Patient requesting pain meds-RN notified (RN came in at end of session with pain meds to give patient)         Frequency  Min 2X/week        Progress Toward Goals  OT Goals(current goals can now be found in the care plan section)  Progress towards OT goals: Progressing toward goals  Acute Rehab OT Goals Patient Stated Goal: to get better OT Goal Formulation: With patient Time For Goal Achievement: 11/18/19 Potential to Achieve Goals: Good ADL Goals Pt Will Perform Grooming:  with set-up;with supervision;sitting Pt Will Perform Upper Body Dressing: with set-up;with supervision;sitting Pt Will Perform Lower Body Dressing: with min assist (min guard A sit<>stand) Pt Will Transfer to Toilet: with min guard assist;ambulating;bedside commode (over toilet) Pt Will Perform Toileting - Clothing Manipulation and hygiene: with min guard assist;sit to/from stand  Plan Discharge plan remains appropriate       AM-PAC OT "6 Clicks" Daily Activity     Outcome Measure   Help from another person eating meals?: A Little (setup) Help from another person taking care of personal grooming?: A Little (setup) Help from another person toileting, which includes using toliet, bedpan, or urinal?: A Lot Help from another person bathing (including washing, rinsing, drying)?: A Lot Help from another person to put on and taking off regular upper body clothing?: A Little Help from another person to put on and taking off regular lower body clothing?: Total 6 Click Score: 14    End of Session Equipment Utilized During Treatment: Gait belt;Rolling walker  OT Visit Diagnosis: Unsteadiness on feet (R26.81);Other abnormalities of gait and mobility (R26.89);Muscle weakness (generalized) (M62.81);History of falling (Z91.81);Pain Pain - part of body:  (lower back)   Activity Tolerance Patient tolerated treatment well   Patient Left in chair;with call bell/phone within reach;with chair alarm set;with nursing/sitter in room   Nurse Communication Patient requests pain meds (pt needs a new purewick)        Time: 4098-1191 OT Time Calculation (min): 33 min  Charges: OT General Charges $OT Visit: 1 Visit OT Treatments $Self Care/Home Management : 23-37 mins  Ignacia Palma, OTR/L [facility] [employer] Pager [phone] Office [phone]     Evette Georges 11/04/2019, 10:29 AM

## 2019-11-04 NOTE — Care Management Important Message (Signed)
Important Message  Patient Details  Name: Kelly [name] MRN: [id number] Date of Birth: [birth year]   Medicare Important Message Given:  Yes - Important Message mailed due to current National Emergency  Verbal consent obtained due to current National Emergency  Relationship to patient: Self Contact Name: Shavanna Furnari Call Date: 11/04/19  Time: 1537 Phone: [phone] Outcome: Spoke with contact Important Message mailed to: Other (must enter comment) (per patient copy of IM not needed)    Elijahjames Fuelling P Sohil Timko 11/04/2019, 3:38 PM

## 2019-11-04 NOTE — Discharge Summary (Signed)
Physician Discharge Summary  Kelly [name] BZJ:[id number] DOB: 06-23-1955 DOA: 10/21/2019  PCP: Arvilla Market, DO  Admit date: 10/21/2019 Discharge date: 11/04/2019  Time spent: 35 minutes  Recommendations for Outpatient Follow-up:  1. [facility] in 1 month for chronic diarrhea, and dysphagia 2. PCP in 1 week   Discharge Diagnoses:  Severe oropharyngeal and esophageal dysphagia Severe protein calorie malnutrition Adult failure to thrive Hyponatremia Hyperkalemia Aspiration pneumonia Chronic diarrhea Iron deficiency anemia COPD Heavy tobacco abuse Suspected interstitial lung disease Depression CAD   Dysphagia   Unintentional weight loss   Colitis   Hypokalemia   Discharge Condition: Improved  Diet recommendation: Dysphagia 1 diet with thin liquids  Filed Weights   10/24/19 1938 11/02/19 2013 11/03/19 2056  Weight: 49.1 kg 58.1 kg 58.3 kg    History of present illness:  Kelly [name] is an 64 y.o. female with history of COPD, tobacco abuse, chronic diastolic CHF, CAD/PCI and stents, hypertension, dyslipidemia presented to the emergency room 8/20 with worsening weakness, nausea, dysphagia and odynophagia, poor oral intake for over 2-3 months, this has been slowly progressive. ,  Associated with 30 pound weight loss  Hospital Course:   Severe oropharyngeal and esophageal dysphagia Severe protein calorie malnutrition/weight loss Adult failure to thrive -History of dysphagiaandnauseaprogressively worsening for 2-3 months associated with 30 pound weight loss -[facility] consulted, CT abdomen pelvis was unremarkable,, endoscopy 8/21 was unremarkable, follow-up biopsy, Ba esophagram w/out overt findings, limited evaluation of dysmotility -candida noted on path-treated with fluconazole -MBS: Dysphagia, pharyngoesophageal phase: dys 1 thin liquids -Prior history of cervical hardware/cervical spine surgery also contributing to dysphagia -TSH and  random cortisol within normal limits -startedmarinol, scheduled zofran -Oral intake remains very poor and given her poor social support with essentially bedbound status after much discussion with patient, PEG tube was recommended per GI and placed by interventional radiology on 8/27 -Continue tube feeds and dysphagia 1 diet -Discharged to SNF for short-term rehab, close follow-up with PCP in 1 week and [facility] in 1 month  Hyponatremia -improved with free water  Hyperkalemia Mild acute kidney injury Metabolic acidosis -Likely from chronic diarrhea -Hydrated with IV fluids, add more free water -resolved, losartan discontinued  Aspiration pna/leukocytosis -During this hospitalization she developed cough, dyspnea and x-ray noted left side infiltrate,  -Subsequently started on antibiotics for pneumonia aspiration pneumonia clinically improved, completed 6-day course today  Chronic diarrhea -CT abdomen with questionable mild colitis, clinically do not suspect active colitis -Antibiotics discontinued, could be malabsorption related -C. difficile PCR and GI pathogen panel are negative -Gastroenterology recommended scheduled and as needed Imodium, follow-up with [facility] for colonoscopy down the road  Anemia -Anemia panel with iron deficiency and chronic disease, given IV Feraheme x1 today, add oral iron at discharge  Hypokalemia/Hypomagnesemia -Replaced   Suspected ILD COPD/emphysema Heavy tobacco abuse -Concern for interstitial lung disease based on CT, no wheezing or oxygen requirement at this time -Counseled, continue nebs as needed -Recommended pulmonary follow-up  Depression -continue celexa for now -Psych recommended to continue Celexa and follow-up with outpatient psychiatry  CAD History of PCI/stents -Stable  Severe protein calorie malnutrition -Now on tube feeds  Pressure Injury 10/22/19 Hip Left Unstageable - Full thickness tissue loss in  which the base of the injury is covered by slough (yellow, tan, gray, green or brown) and/or eschar (tan, brown or black) in the wound bed. (Active)  10/22/19 0010  Location: Hip  Location Orientation: Left  Staging: Unstageable - Full thickness tissue loss in which  the base of the injury is covered by slough (yellow, tan, gray, green or brown) and/or eschar (tan, brown or black) in the wound bed.  Wound Description (Comments):   Present on Admission: yes       Medical Consultants:    GI- -[facility])  IR- placed g tube   Discharge Exam: Vitals:   11/03/19 2056 11/04/19 0440  BP: (!) 138/49 (!) 140/53  Pulse: 68 72  Resp: 16 18  Temp: 98.6 F (37 C) 98.2 F (36.8 C)  SpO2: 94% 92%    General: AAOx3 Cardiovascular: S1S2/RRR Respiratory: CTAB  Discharge Instructions   Discharge Instructions    Discharge wound care:   Complete by: As directed    As above   Increase activity slowly   Complete by: As directed      Allergies as of 11/04/2019      Reactions   Morphine And Related Nausea And Vomiting   Severe nausea and dry heaves   Other Other (See Comments)   "Many antibiotics cause severe yeast infections" (can tolerate Z-Paks)      Medication List    STOP taking these medications   losartan 25 MG tablet Commonly known as: Cozaar   metoCLOPramide 10 MG tablet Commonly known as: REGLAN   rosuvastatin 5 MG tablet Commonly known as: CRESTOR   topiramate 25 MG tablet Commonly known as: TOPAMAX     TAKE these medications   acetaminophen 325 MG tablet Commonly known as: TYLENOL Take 2 tablets (650 mg total) by mouth every 6 (six) hours as needed for mild pain (or Fever >/= 101).   citalopram 20 MG tablet Commonly known as: CELEXA Take 1 tablet (20 mg total) by mouth daily. What changed: when to take this   diclofenac Sodium 1 % Gel Commonly known as: Voltaren Apply 2 g topically 4 (four) times daily. What changed: how much to take   feeding  supplement (OSMOLITE 1.5 CAL) Liqd Place 300 mLs into feeding tube 4 (four) times daily.   free water Soln Place 250 mLs into feeding tube 4 (four) times daily.   levothyroxine 25 MCG tablet Commonly known as: Euthyrox TAKE 1 TABLET BY MOUTH ONCE DAILY BEFORE BREAKFAST What changed:   how much to take  how to take this  when to take this  additional instructions   lidocaine 5 % Commonly known as: LIDODERM Place 1 patch onto the skin daily. Remove & Discard patch within 12 hours or as directed by MD   loperamide 2 MG capsule Commonly known as: IMODIUM Take 1 capsule (2 mg total) by mouth 2 (two) times daily.   loperamide 2 MG capsule Commonly known as: IMODIUM Take 1 capsule (2 mg total) by mouth 2 (two) times daily as needed for diarrhea or loose stools.   metoprolol succinate 25 MG 24 hr tablet Commonly known as: TOPROL-XL Take 0.5 tablets (12.5 mg total) by mouth daily.   nitroGLYCERIN 0.4 MG SL tablet Commonly known as: NITROSTAT Place 0.4 mg under the tongue every 5 (five) minutes as needed for chest pain.   ondansetron 4 MG disintegrating tablet Commonly known as: Zofran ODT Take 1 tablet (4 mg total) by mouth every 8 (eight) hours as needed for nausea or vomiting.   pantoprazole 40 MG tablet Commonly known as: PROTONIX Take 1 tablet (40 mg total) by mouth daily.   pregabalin 75 MG capsule Commonly known as: LYRICA Take 1 capsule (75 mg total) by mouth 2 (two) times daily.  Durable Medical Equipment  (From admission, onward)         Start     Ordered   10/28/19 1617  For home use only DME Tube feeding  Once       Comments: Initial Nutrition Assessment  DOCUMENTATION CODES:   Non-severe (moderate) malnutrition in context of chronic illness  INTERVENTION:  Continue Boost Breeze po TID, each supplement provides 250 kcal and 9 grams of protein  Once gastrostomy tube is placed and ready for use: -Begin Osmolite 1.5 @ 20 ml/hr,  advance by 10 ml every 8 hours to goal rate of 50 ml/hr -10ml Prosource TF daily  At goal, tube feeding regimen will provide 1840 kcals, 86 grams of protein, free water  Anticipate  need greater than 90 days   10/28/19 1619           Discharge Care Instructions  (From admission, onward)         Start     Ordered   11/04/19 0000  Discharge wound care:       Comments: As above   11/04/19 1337         Allergies  Allergen Reactions  . Morphine And Related Nausea And Vomiting    Severe nausea and dry heaves  . Other Other (See Comments)    "Many antibiotics cause severe yeast infections" (can tolerate Z-Paks)    Follow-up Information    [facility] Follow up.   Specialty: Home Health Services Why: the office will call to schedule home health visits Contact information: [address] [place] [address] [phone]        Arvilla Market, DO. Schedule an appointment as soon as possible for a visit in 1 week(s).   Specialty: Family Medicine Contact information: [address] [place] [address] [phone]        Bernette Redbird, MD. Schedule an appointment as soon as possible for a visit in 1 month(s).   Specialty: Gastroenterology Contact information: [address] [place] [address] [phone]                The results of significant diagnostics from this hospitalization (including imaging, microbiology, ancillary and laboratory) are listed below for reference.    Significant Diagnostic Studies: DG Chest 2 View  Result Date: 10/21/2019 CLINICAL DATA:  Chest pain for 2-3 months, congestion, weakness, history coronary artery disease, CHF, hypertension EXAM: CHEST - 2 VIEW COMPARISON:  05/02/2018 FINDINGS: Normal heart size, mediastinal contours, and pulmonary vascularity. Multiple coronary stents. Atherosclerotic calcification aorta. Lungs mildly hyperinflated but clear. No acute  infiltrate, pleural effusion or pneumothorax. Bones demineralized with scoliosis thoracic spine. IMPRESSION: Hyperinflated lungs without infiltrate. Electronically Signed   By: Ulyses Southward M.D.   On: 10/21/2019 14:17   IR GASTROSTOMY TUBE MOD SED  Result Date: 10/28/2019 INDICATION: Dysphagia, malnutrition EXAM: FLUOROSCOPIC 20 FRENCH PULL-THROUGH GASTROSTOMY Date:  10/28/2019 10/28/2019 11:54 am Radiologist:  M. Ruel Favors, MD Guidance:  Fluoroscopic MEDICATIONS: Ancef 2 g; Antibiotics were administered within 1 hour of the procedure. Glucagon 0.5 mg IV ANESTHESIA/SEDATION: Versed 2.0 mg IV; Fentanyl 100 mcg IV Moderate Sedation Time:  14 minutes The patient was continuously monitored during the procedure by the interventional radiology nurse under my direct supervision. CONTRAST:  10 cc-administered into the gastric lumen. FLUOROSCOPY TIME:  Fluoroscopy Time: 2 minutes 36 seconds (9 mGy). COMPLICATIONS: None immediate. PROCEDURE: Informed consent was obtained from the patient following explanation of the procedure,  risks, benefits and alternatives. The patient understands, agrees and consents for the procedure. All questions were addressed. A time out was performed. Maximal barrier sterile technique utilized including caps, mask, sterile gowns, sterile gloves, large sterile drape, hand hygiene, and betadine prep. The left upper quadrant was sterilely prepped and draped. An oral gastric catheter was inserted into the stomach under fluoroscopy. The existing nasogastric feeding tube was removed. Air was injected into the stomach for insufflation and visualization under fluoroscopy. The air distended stomach was confirmed beneath the anterior abdominal wall in the frontal and lateral projections. Under sterile conditions and local anesthesia, a 17 gauge trocar needle was utilized to access the stomach percutaneously beneath the left subcostal margin. Needle position was confirmed within the stomach under biplane  fluoroscopy. Contrast injection confirmed position also. A single T tack was deployed for gastropexy. Over an Amplatz guide wire, a 9-French sheath was inserted into the stomach. A snare device was utilized to capture the oral gastric catheter. The snare device was pulled retrograde from the stomach up the esophagus and out the oropharynx. The 20-French pull-through gastrostomy was connected to the snare device and pulled antegrade through the oropharynx down the esophagus into the stomach and then through the percutaneous tract external to the patient. The gastrostomy was assembled externally. Contrast injection confirms position in the stomach. Images were obtained for documentation. The patient tolerated procedure well. No immediate complication. IMPRESSION: Fluoroscopic insertion of a 20-French "pull-through" gastrostomy. Electronically Signed   By: Judie Petit.  Shick M.D.   On: 10/28/2019 11:57   CT CHEST ABDOMEN PELVIS W CONTRAST  Result Date: 10/21/2019 CLINICAL DATA:  Generalized weakness, weight loss EXAM: CT CHEST, ABDOMEN, AND PELVIS WITH CONTRAST TECHNIQUE: Multidetector CT imaging of the chest, abdomen and pelvis was performed following the standard protocol during bolus administration of intravenous contrast. CONTRAST:  OMNIPAQUE IOHEXOL 300 MG/ML  SOLN COMPARISON:  CT 10/22/2018, radiograph 10/21/2019 FINDINGS: CT CHEST FINDINGS Cardiovascular: The aortic root is suboptimally assessed given cardiac pulsation artifact. Atherosclerotic plaque within the normal caliber aorta. No acute luminal abnormality. No periaortic stranding or hemorrhage. Shared origin of the brachiocephalic and left common carotid artery. Proximal great vessels demonstrate atheromatous plaque including calcification at the left vertebral artery ostium incompletely assessed on this non angiographic technique. Central pulmonary arteries are borderline enlarged. No large central or lobar filling defects on this non tailored  examination. Cardiac size within normal limits. Three-vessel coronary artery disease is noted. No pericardial effusion. No major venous anomalies. Mediastinum/Nodes: No mediastinal fluid or gas. Postsurgical changes likely reflecting prior left hemithyroidectomy with a normal appearance of right lobe thyroid gland. No acute abnormality of the trachea. Mild circumferential thickening and mucosal hyperemia of the mid to distal thoracic esophagus with small amount of fluid the level of the mid esophagus. No worrisome mediastinal, hilar or axillary adenopathy. Lungs/Pleura: Nonspecific ill-defined centrilobular micro nodularity distributed throughout the bilateral upper lobes/apices as well as further areas of ground-glass opacity with a mid to upper lung predilection. Some mild diffuse airways thickening and scattered secretions are noted as well as a background of mild centrilobular emphysematous change. No focal consolidative opacity is evident. No pneumothorax. No other concerning nodularity or pulmonary masses. Musculoskeletal: The osseous structures appear diffusely demineralized which may limit detection of small or nondisplaced fractures. No acute osseous abnormality or suspicious osseous lesion. Mild chest wall deformity associated with the severe dextrocurvature of the thoracolumbar spine. Multilevel degenerative changes in the spine. Milder degenerative changes in the bilateral shoulders. No acute  or worrisome osseous lesions are evident. CT ABDOMEN PELVIS FINDINGS Hepatobiliary: Mildly heterogenous hepatic parenchymal enhancement. Small normal hepatic attenuation and smooth liver surface contour seen on the delayed phase imaging. No visible focal liver lesions. Mild intra and extrahepatic biliary ductal dilatation is possibly related to reservoir effect given the postsurgical changes from prior cholecystectomy and similarity to comparison exam without visible intraductal gallstones. Pancreas: Unremarkable.  No pancreatic ductal dilatation or surrounding inflammatory changes. Spleen: Normal in size. No concerning splenic lesions. Adrenals/Urinary Tract: Lobular thickening of the bilateral adrenal glands. No concerning dominant adrenal nodules. Mild bilateral cortical thinning both kidneys which otherwise enhance and excrete symmetrically. Nonspecific mild bilateral perinephric stranding is also unchanged from prior and is a nonspecific finding. No concerning renal masses. No urolithiasis or hydronephrosis. Urinary bladder is unremarkable. Stomach/Bowel: Thickened distal thoracic esophagus. Postsurgical changes at the GE junction may reflect prior fundoplication/wrap procedure. Duodenum with a normal course across the midline abdomen. No small bowel thickening or dilatation is evident. A normal appendix is visualized. Diffuse pancolonic edematous mural thickening and mucosal hyperemia with faint pericolonic hazy stranding. Lack of formed stool in the colon. Fluid-filled rectum noted as well. Serpentine hyperenhancement towards the rectum likely reflecting hemorrhoidal enhancement. Vascular/Lymphatic: Atherosclerotic calcifications within the abdominal aorta and branch vessels. No aneurysm or ectasia. Paired renal arteries bilaterally. Atheromatous ostial narrowing at the SMA, IMA and renal artery origins. No enlarged abdominopelvic lymph nodes. Reproductive: Mildly retroverted uterus. No concerning adnexal lesions. Previously seen cystic lesion in the right ovary no longer readily apparent with quiescent appearance of the ovarian tissue. Other: Circumferential body wall edema. Suspect some mild superficial ulceration adjacent the bilateral greater trochanters and left ischial tuberosity without organized collection or abscess. No abdominopelvic free air or fluid. Mild body wall edema. No bowel containing hernias. Musculoskeletal: Diffuse muscular atrophy. Severe dextrocurvature of the thoracolumbar spine with apex at the  L2-3 level and evidence of prior L3-L5 posterior spinal fusion and interbody cage placement. No evidence of hardware complication. Prior right iliac group bone harvest site. The osseous structures appear diffusely demineralized which may limit detection of small or nondisplaced fractures. No acute or suspicious osseous lesions are evident at this time. Multilevel degenerative changes in the spine. Additional degenerative changes in the hips and pelvis. IMPRESSION: 1. Nonspecific ill-defined centrilobular micro nodularity distributed throughout the bilateral upper lobes/apices as well as further areas of ground-glass opacity with a mid to upper lung predilection. Findings are suggestive of a potential interstitial process such as RB I LD particularly in this patient with current and extensive smoking history. 2. Background of mild centrilobular emphysema ( Emphysema (ICD10-J43.9).) 3. Mild circumferential thickening and mucosal hyperemia of the mid to distal thoracic esophagus with small amount of fluid the level of the mid esophagus, suggestive of esophagitis. Consider correlation with outpatient endoscopy. 4. Diffuse pancolonic edematous mural thickening and mucosal hyperemia with faint pericolonic hazy stranding. Lack of formed stool in the colon. Serpentine hyperenhancement towards the rectum likely reflecting hemorrhoidal enhancement. Findings are suggestive of infectious or inflammatory colitis with rapid transit state. 5. Mildly heterogenous hepatic parenchymal enhancement, nonspecific but can be seen with hepatitis. Correlate with LFTs. 6. Biliary ductal prominence likely related to post cholecystectomy reservoir effect given stability and lack of visible intraductal gallstones. 7. Severe dextrocurvature of the thoracolumbar spine with apex at the L2-3 level and evidence of prior L3-L5 posterior spinal fusion and interbody cage placement. No evidence of hardware complication.Mild associated chest wall  deformity. 8. Suspect some mild superficial ulceration  adjacent the bilateral greater trochanters and left ischial tuberosity without organized collection or abscess. Correlate with visual inspection. 9. Three-vessel coronary artery disease. 10. Aortic Atherosclerosis (ICD10-I70.0). Ostial narrowing at the IMA, SMA and renal artery origins. Electronically Signed   By: Kreg Shropshire M.D.   On: 10/21/2019 19:35   DG CHEST PORT 1 VIEW  Result Date: 10/30/2019 CLINICAL DATA:  Aspiration and airway EXAM: PORTABLE CHEST 1 VIEW COMPARISON:  October 21, 2019 FINDINGS: The cardiomediastinal silhouette is stable. New infiltrate in the left mid lung and left retrocardiac region. Mild diffuse interstitial prominence. No pneumothorax. No other changes. IMPRESSION: 1. New infiltrate in the left mid lung and left retrocardiac region may represent aspiration or pneumonia. Recommend clinical correlation. 2. Minimal interstitial prominence suggests pulmonary venous congestion. 3. No other acute abnormalities. Electronically Signed   By: Gerome Sam III M.D   On: 10/30/2019 08:23   DG Swallowing Func-Speech Pathology  Result Date: 10/25/2019 Objective Swallowing Evaluation: Type of Study: MBS-Modified Barium Swallow Study  Patient Details Name: Brenlynn Fake MRN: [id number] Date of Birth: [birth year] Today's Date: 10/25/2019 Time: SLP Start Time (ACUTE ONLY): 1315 -SLP Stop Time (ACUTE ONLY): 1330 SLP Time Calculation (min) (ACUTE ONLY): 15 min Past Medical History: Past Medical History: Diagnosis Date . CAD (coronary artery disease)   5 stents in the past, 4 in [facility], 1 [place] [facility]. Last cath 01/2017 . CHF (congestive heart failure) (HCC)  . 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 Hyperlipidemia LDL goal <70  . Hypertension  Past Surgical History: Past Surgical History: Procedure Laterality Date . BIOPSY  10/22/2019  Procedure: BIOPSY;  Surgeon: Bernette Redbird, MD;  Location: [facility] ENDOSCOPY;  Service: Endoscopy;; . CARDIAC CATHETERIZATION    stents x 5 . ESOPHAGOGASTRODUODENOSCOPY (EGD) WITH PROPOFOL N/A 10/22/2019  Procedure: ESOPHAGOGASTRODUODENOSCOPY (EGD) WITH PROPOFOL;  Surgeon: Bernette Redbird, MD;  Location: [facility] ENDOSCOPY;  Service: Endoscopy;  Laterality: N/A; HPI: Kelly [name] is a 64 y.o. female with medical history significant of CAD status post PCI, chronic diastolic CHF, hypertension, hyperlipidemia presenting with complaints of generalized weakness, nausea, dysphagia, and poor oral intake. Patient states she has had difficulty swallowing for at least 2 months now and she has been losing a lot of weight. Both food and liquid gets stuck when she tries to swallow and it is painful. States she has lost at least 20 to 25 pounds. She feels nauseous but is not vomiting. She is hardly able to eat anything and when she does, she experiences diarrhea. Denies abdominal pain. Reports having a chronic cough due to history of smoking. She smokes 1 pack of cigarettes daily. Denies fevers, shortness of breath, or chest pain. She has not been vaccinated against Covid.  CT of the abdomen and pelvis with findings suggestive of esophagitis and infection or inflammatory colitis.  Subjective: alert, upright in chair for procedure Assessment / Plan / Recommendation CHL IP CLINICAL IMPRESSIONS 10/25/2019 Clinical Impression Pharyngoesophageal dysphagia evidenced with deficits in both swallow safety and efficiency. Cervical hardware noted C4/C5 from past cervical surgery with posterior pharyngeal wall anatomical position more anteriorly at level of hardware. This suspected to contribute to incomplete epiglottic deflection which negatively impacting laryngeal vestibule closure. Deficits also noted in UES distention, base of tongue retraction, hyoid excursion, and diminished sensation. Pt with frequent during the swallow laryngeal penetration and frequent post swallow aspiration of  liquid viscosities (thin, nectar thick, and honey thick liquids); inconsisently sensed. Vallecular and  pyriform sinus residuals noted that spilled post swallow (worsened with thicker consistencies). Suspect dysphagia has been ongoing with chronic episodic aspiration. Chin tuck ineffective at improving airway protection. Swallow safety was maximized by smaller sips/bites, multiple swallows, and use of throat clear and or cough. Pt with barium tablet retention in distal esophagus that eventually passed into the stomach, barium retention exhibited as consistent with barium swallow evaluation.  Recommend continue dysphagia 1 (puree) thin liquids with medicines in puree, crush larger.   SLP Visit Diagnosis Dysphagia, pharyngoesophageal phase (R13.14) Attention and concentration deficit following -- Frontal lobe and executive function deficit following -- Impact on safety and function Moderate aspiration risk   CHL IP TREATMENT RECOMMENDATION 10/25/2019 Treatment Recommendations Therapy as outlined in treatment plan below   Prognosis 10/25/2019 Prognosis for Safe Diet Advancement Fair Barriers to Reach Goals -- Barriers/Prognosis Comment -- CHL IP DIET RECOMMENDATION 10/25/2019 SLP Diet Recommendations Dysphagia 1 (Puree) solids;Thin liquid Liquid Administration via Cup Medication Administration Whole meds with puree Compensations Slow rate;Small sips/bites;Follow solids with liquid;Multiple dry swallows after each bite/sip;Clear throat intermittently Postural Changes Seated upright at 90 degrees;Remain semi-upright after after feeds/meals (Comment)   CHL IP OTHER RECOMMENDATIONS 10/25/2019 Recommended Consults -- Oral Care Recommendations Oral care BID Other Recommendations --   CHL IP FOLLOW UP RECOMMENDATIONS 10/23/2019 Follow up Recommendations Other (comment)   CHL IP FREQUENCY AND DURATION 10/25/2019 Speech Therapy Frequency (ACUTE ONLY) min 1 x/week Treatment Duration 1 week      CHL IP ORAL PHASE 10/25/2019 Oral Phase  WFL Oral - Pudding Teaspoon -- Oral - Pudding Cup -- Oral - Honey Teaspoon -- Oral - Honey Cup -- Oral - Nectar Teaspoon -- Oral - Nectar Cup -- Oral - Nectar Straw -- Oral - Thin Teaspoon -- Oral - Thin Cup -- Oral - Thin Straw -- Oral - Puree -- Oral - Mech Soft -- Oral - Regular -- Oral - Multi-Consistency -- Oral - Pill -- Oral Phase - Comment --  CHL IP PHARYNGEAL PHASE 10/25/2019 Pharyngeal Phase Impaired Pharyngeal- Pudding Teaspoon -- Pharyngeal -- Pharyngeal- Pudding Cup -- Pharyngeal -- Pharyngeal- Honey Teaspoon -- Pharyngeal -- Pharyngeal- Honey Cup Penetration/Aspiration during swallow;Penetration/Apiration after swallow;Trace aspiration;Pharyngeal residue - valleculae;Pharyngeal residue - pyriform;Reduced laryngeal elevation;Reduced pharyngeal peristalsis;Reduced epiglottic inversion;Reduced tongue base retraction Pharyngeal Material enters airway, remains ABOVE vocal cords and not ejected out;Material enters airway, passes BELOW cords without attempt by patient to eject out (silent aspiration) Pharyngeal- Nectar Teaspoon -- Pharyngeal -- Pharyngeal- Nectar Cup Reduced epiglottic inversion;Reduced laryngeal elevation;Reduced tongue base retraction;Pharyngeal residue - valleculae;Pharyngeal residue - pyriform;Penetration/Aspiration during swallow;Penetration/Apiration after swallow;Reduced pharyngeal peristalsis;Reduced airway/laryngeal closure Pharyngeal Material enters airway, remains ABOVE vocal cords and not ejected out Pharyngeal- Nectar Straw -- Pharyngeal -- Pharyngeal- Thin Teaspoon -- Pharyngeal -- Pharyngeal- Thin Cup Penetration/Aspiration during swallow;Penetration/Apiration after swallow;Reduced epiglottic inversion;Reduced tongue base retraction;Reduced laryngeal elevation;Pharyngeal residue - valleculae;Pharyngeal residue - pyriform;Reduced pharyngeal peristalsis Pharyngeal Material enters airway, passes BELOW cords without attempt by patient to eject out (silent aspiration);Material  enters airway, remains ABOVE vocal cords and not ejected out;Material enters airway, CONTACTS cords and not ejected out Pharyngeal- Thin Straw -- Pharyngeal -- Pharyngeal- Puree Reduced epiglottic inversion;Reduced laryngeal elevation;Reduced tongue base retraction;Reduced airway/laryngeal closure;Penetration/Aspiration during swallow;Penetration/Apiration after swallow;Pharyngeal residue - valleculae;Pharyngeal residue - pyriform Pharyngeal Material enters airway, remains ABOVE vocal cords and not ejected out Pharyngeal- Mechanical Soft Delayed swallow initiation-vallecula;Reduced epiglottic inversion;Reduced tongue base retraction;Reduced laryngeal elevation;Pharyngeal residue - valleculae;Pharyngeal residue - pyriform Pharyngeal -- Pharyngeal- Regular -- Pharyngeal -- Pharyngeal- Multi-consistency -- Pharyngeal -- Pharyngeal-  Pill Reduced tongue base retraction;Reduced laryngeal elevation;Pharyngeal residue - pyriform;Pharyngeal residue - valleculae;Penetration/Aspiration during swallow;Penetration/Apiration after swallow;Reduced epiglottic inversion Pharyngeal Material enters airway, passes BELOW cords without attempt by patient to eject out (silent aspiration) Pharyngeal Comment --  CHL IP CERVICAL ESOPHAGEAL PHASE 10/25/2019 Cervical Esophageal Phase Impaired Pudding Teaspoon -- Pudding Cup -- Honey Teaspoon -- Honey Cup Reduced cricopharyngeal relaxation Nectar Teaspoon -- Nectar Cup Reduced cricopharyngeal relaxation Nectar Straw -- Thin Teaspoon -- Thin Cup Reduced cricopharyngeal relaxation Thin Straw -- Puree Reduced cricopharyngeal relaxation Mechanical Soft Reduced cricopharyngeal relaxation Regular -- Multi-consistency -- Pill Reduced cricopharyngeal relaxation Cervical Esophageal Comment -- Chelsea E Hartness MA, CCC-SLP 10/25/2019, 3:46 PM              DG ESOPHAGUS W SINGLE CM (SOL OR THIN BA)  Result Date: 10/24/2019 CLINICAL DATA:  Dysphagia EXAM: ESOPHOGRAM/BARIUM SWALLOW TECHNIQUE: Single  contrast examination was performed using  thin barium. FLUOROSCOPY TIME:  Fluoroscopy Time:  1 minutes 30 seconds Radiation Exposure Index (if provided by the fluoroscopic device): 5.80 Number of Acquired Spot Images: 1 COMPARISON:  Chest CT from October 21, 2019 FINDINGS: Thin barium was administered in small swallows were performed in oblique and lateral positioning at the initial portion of the exam which showed deep penetration and coating of the larynx with barium contrast material. No frank aspiration was noted though larger volume swallows were not performed due to this finding. Initial images show changes of C4-5 spinal fusion with ACDF, partially visualized. Esophageal dysmotility with suggested on subsequent imaging. Mucosal assessment not possible and evidence of retained material in the esophagus with tertiary peristalsis. After the initial 2-3 swallows the patient began to rash and vomit small quantities of ingested material. For this reason the examination was completed and the patient was returned to the floor. IMPRESSION: 1. Signs of deep penetration and coating of the larynx with barium contrast material. No frank aspiration was noted though larger volume swallows were not performed. Dedicated swallow study is suggested for further evaluation. 2. Esophageal dysmotility. Very limited exam due to patient's inability to stand for the evaluation and due to nausea and small volume emesis. Electronically Signed   By: Donzetta Kohut M.D.   On: 10/24/2019 11:09    Microbiology: Recent Results (from the past 240 hour(s))  SARS Coronavirus 2 by RT PCR (hospital order, performed in [facility] hospital lab) Nasopharyngeal Nasopharyngeal Swab     Status: None   Collection Time: 11/04/19 10:41 AM   Specimen: Nasopharyngeal Swab  Result Value Ref Range Status   SARS Coronavirus 2 NEGATIVE NEGATIVE Final    Comment: (NOTE) SARS-CoV-2 target nucleic acids are NOT DETECTED.  The SARS-CoV-2 RNA is  generally detectable in upper and lower respiratory specimens during the acute phase of infection. The lowest concentration of SARS-CoV-2 viral copies this assay can detect is 250 copies / mL. A negative result does not preclude SARS-CoV-2 infection and should not be used as the sole basis for treatment or other patient management decisions.  A negative result may occur with improper specimen collection / handling, submission of specimen other than nasopharyngeal swab, presence of viral mutation(s) within the areas targeted by this assay, and inadequate number of viral copies (<250 copies / mL). A negative result must be combined with clinical observations, patient history, and epidemiological information.  Fact Sheet for Patients:   BoilerBrush.com.cy  Fact Sheet for Healthcare Providers: https://pope.com/  This test is not yet approved or  cleared by the [place] FDA and has  been authorized for detection and/or diagnosis of SARS-CoV-2 by FDA under an Emergency Use Authorization (EUA).  This EUA will remain in effect (meaning this test can be used) for the duration of the COVID-19 declaration under Section 564(b)(1) of the Act, 21 U.S.C. section 360bbb-3(b)(1), unless the authorization is terminated or revoked sooner.  Performed at [facility], [address], [place], [place] [address]      Labs: Basic Metabolic Panel: Recent Labs  Lab 10/31/19 0546 11/01/19 0743 11/02/19 0030 11/03/19 0424 11/04/19 0556  NA 132* 134* 132* 134* 137  K 6.1* 6.1* 5.9* 5.2* 4.3  CL 110 110 105 106 111  CO2 16* 17* 19* 20* 21*  GLUCOSE 244* 87 174* 137* 98  BUN 27* 30* 28* 28* 19  CREATININE 1.40* 1.44* 1.39* 1.24* 1.04*  CALCIUM 7.7* 7.8* 7.6* 7.6* 7.8*  MG 2.6*  --   --   --   --    Liver Function Tests: No results for input(s): AST, ALT, ALKPHOS, BILITOT, PROT, ALBUMIN in the last 168 hours. No results for input(s):  LIPASE, AMYLASE in the last 168 hours. No results for input(s): AMMONIA in the last 168 hours. CBC: Recent Labs  Lab 10/30/19 0201 10/31/19 0546 11/01/19 0743 11/02/19 0030 11/03/19 0424  WBC 13.9* 9.0 7.0 7.3 7.5  HGB 8.4* 7.8* 7.9* 8.1* 8.0*  HCT 24.8* 24.5* 24.2* 24.4* 24.3*  MCV 102.1* 105.2* 105.2* 103.8* 103.4*  PLT 149* 168 222 255 283   Cardiac Enzymes: No results for input(s): CKTOTAL, CKMB, CKMBINDEX, TROPONINI in the last 168 hours. BNP: BNP (last 3 results) No results for input(s): BNP in the last 8760 hours.  ProBNP (last 3 results) No results for input(s): PROBNP in the last 8760 hours.  CBG: Recent Labs  Lab 11/03/19 1119 11/03/19 1628 11/03/19 2058 11/04/19 0649 11/04/19 1125  GLUCAP 153* 99 118* 85 160*   Signed:  Zannie Cove MD.  [facility] 11/04/2019, 1:39 PM

## 2019-11-04 NOTE — Progress Notes (Signed)
   Covid-19 Vaccination Clinic  Name:  Kelly [name]    MRN: [id number] DOB: [birth year]  11/04/2019  [name] was observed post Covid-19 immunization for 15 minutes without incident. She was provided with Vaccine Information Sheet and instruction to access the V-Safe system.   [name] was instructed to call 911 with any severe reactions post vaccine: Marland Kitchen Difficulty breathing  . Swelling of face and throat  . A fast heartbeat  . A bad rash all over body  . Dizziness and weakness   Immunizations Administered    Name Date Dose VIS Date Route   JANSSEN COVID-19 VACCINE 11/04/2019  2:12 PM 0.5 mL 04/30/2019 Intramuscular   Manufacturer: [employer]   Lot: [id number]   NDC: 91980-221-79

## 2019-11-04 NOTE — TOC Progression Note (Addendum)
Transition of Care (TOC) - Progression Note  *Discharge to [facility] on Saturday, 11/06/19   Patient Details  Name: Armonie Mettler MRN: [id number] Date of Birth: 07-05-1955  Transition of Care [facility]) CM/SW Contact  Okey Dupre Lazaro Arms, LCSW Phone Number: 11/04/2019, 2:08 PM  Clinical Narrative:  CSW visited with patient (11:53 pm) and provided patient with bed offer [facility]. When asked, patient agreeable to going to this facility for ST rehab. Patient reported that she has not been vaccinated and would like to have the [employer] vaccine. Olegario Messier, admissions director at [facility] contacted and indicated that they could accept patient, however she needed to check with admissions. CSW contacted [facility] and confirmed that patient is not currently managed by them. MD advised regarding patient choosing a facility and that she could be discharged pending a COVID test.   Talked later with Olegario Messier and was informed that they cannot take patient today as patient is not vaccinated and they don't currently have a private room for her. Olegario Messier informed that patient would be vaccinated prior to discharge, however she would still have to go into a private room. Per Olegario Messier, they will be able to take patient on Saturday.  MD contacted and updated.  5:11 pm - [employer] and patient has been vaccinated.  5:28 pm: Visited with patient and updated her that she will discharge to [facility] on Saturday, when a private room is available. Patient talked with CSW regarding her son who lives in [place], which is where she used to live; and her desire and plan to move back there at some point after completing ST rehab.      Expected Discharge Plan: Skilled Nursing Facility Barriers to Discharge: Continued Medical Work up  Expected Discharge Plan and Services Expected Discharge Plan: Skilled Nursing Facility In-house Referral: Clinical Social Work Discharge Planning Services: CM Consult Post  Acute Care Choice: Home Health Living arrangements for the past 2 months: Single Family Home Expected Discharge Date: 11/04/19               DME Arranged: Tamsen Snider feeding DME Agency: Other - Comment [facility]) Date DME Agency Contacted: 10/28/19 Time DME Agency Contacted: 68 Representative spoke with at DME Agency: Pam HH Arranged: RN, PT HH Agency: [facility] Date [facility] Agency Contacted: 10/28/19 Time HH Agency Contacted: 1721 Representative spoke with at [facility] Agency: Kandee Keen   Social Determinants of Health (SDOH) Interventions  No SDOH interventions requested or needed at this time.  Readmission Risk Interventions No flowsheet data found.

## 2019-11-05 DIAGNOSIS — R5383 Other fatigue: Secondary | ICD-10-CM | POA: Diagnosis not present

## 2019-11-05 DIAGNOSIS — R140 Abdominal distension (gaseous): Secondary | ICD-10-CM | POA: Diagnosis not present

## 2019-11-05 DIAGNOSIS — R1312 Dysphagia, oropharyngeal phase: Secondary | ICD-10-CM | POA: Diagnosis not present

## 2019-11-05 DIAGNOSIS — R262 Difficulty in walking, not elsewhere classified: Secondary | ICD-10-CM | POA: Diagnosis not present

## 2019-11-05 DIAGNOSIS — Z23 Encounter for immunization: Secondary | ICD-10-CM | POA: Diagnosis not present

## 2019-11-05 DIAGNOSIS — D72829 Elevated white blood cell count, unspecified: Secondary | ICD-10-CM | POA: Diagnosis not present

## 2019-11-05 DIAGNOSIS — K5909 Other constipation: Secondary | ICD-10-CM | POA: Diagnosis not present

## 2019-11-05 DIAGNOSIS — Z720 Tobacco use: Secondary | ICD-10-CM | POA: Diagnosis not present

## 2019-11-05 DIAGNOSIS — R202 Paresthesia of skin: Secondary | ICD-10-CM | POA: Diagnosis not present

## 2019-11-05 DIAGNOSIS — R5381 Other malaise: Secondary | ICD-10-CM | POA: Diagnosis not present

## 2019-11-05 DIAGNOSIS — J9612 Chronic respiratory failure with hypercapnia: Secondary | ICD-10-CM | POA: Diagnosis not present

## 2019-11-05 DIAGNOSIS — M62838 Other muscle spasm: Secondary | ICD-10-CM | POA: Diagnosis not present

## 2019-11-05 DIAGNOSIS — R1314 Dysphagia, pharyngoesophageal phase: Secondary | ICD-10-CM | POA: Diagnosis not present

## 2019-11-05 DIAGNOSIS — M1712 Unilateral primary osteoarthritis, left knee: Secondary | ICD-10-CM | POA: Diagnosis not present

## 2019-11-05 DIAGNOSIS — R0602 Shortness of breath: Secondary | ICD-10-CM | POA: Diagnosis not present

## 2019-11-05 DIAGNOSIS — G8929 Other chronic pain: Secondary | ICD-10-CM | POA: Diagnosis not present

## 2019-11-05 DIAGNOSIS — R2232 Localized swelling, mass and lump, left upper limb: Secondary | ICD-10-CM | POA: Diagnosis not present

## 2019-11-05 DIAGNOSIS — Z7401 Bed confinement status: Secondary | ICD-10-CM | POA: Diagnosis not present

## 2019-11-05 DIAGNOSIS — M2550 Pain in unspecified joint: Secondary | ICD-10-CM | POA: Diagnosis not present

## 2019-11-05 DIAGNOSIS — R509 Fever, unspecified: Secondary | ICD-10-CM | POA: Diagnosis not present

## 2019-11-05 DIAGNOSIS — S91104A Unspecified open wound of right lesser toe(s) without damage to nail, initial encounter: Secondary | ICD-10-CM | POA: Diagnosis not present

## 2019-11-05 DIAGNOSIS — R1084 Generalized abdominal pain: Secondary | ICD-10-CM | POA: Diagnosis not present

## 2019-11-05 DIAGNOSIS — J441 Chronic obstructive pulmonary disease with (acute) exacerbation: Secondary | ICD-10-CM | POA: Diagnosis not present

## 2019-11-05 DIAGNOSIS — R197 Diarrhea, unspecified: Secondary | ICD-10-CM | POA: Diagnosis not present

## 2019-11-05 DIAGNOSIS — R531 Weakness: Secondary | ICD-10-CM | POA: Diagnosis not present

## 2019-11-05 DIAGNOSIS — G609 Hereditary and idiopathic neuropathy, unspecified: Secondary | ICD-10-CM | POA: Diagnosis not present

## 2019-11-05 DIAGNOSIS — L97519 Non-pressure chronic ulcer of other part of right foot with unspecified severity: Secondary | ICD-10-CM | POA: Diagnosis not present

## 2019-11-05 DIAGNOSIS — Z931 Gastrostomy status: Secondary | ICD-10-CM | POA: Diagnosis not present

## 2019-11-05 DIAGNOSIS — M79642 Pain in left hand: Secondary | ICD-10-CM | POA: Diagnosis not present

## 2019-11-05 DIAGNOSIS — I959 Hypotension, unspecified: Secondary | ICD-10-CM | POA: Diagnosis not present

## 2019-11-05 DIAGNOSIS — J189 Pneumonia, unspecified organism: Secondary | ICD-10-CM | POA: Diagnosis not present

## 2019-11-05 DIAGNOSIS — L97512 Non-pressure chronic ulcer of other part of right foot with fat layer exposed: Secondary | ICD-10-CM | POA: Diagnosis not present

## 2019-11-05 DIAGNOSIS — J449 Chronic obstructive pulmonary disease, unspecified: Secondary | ICD-10-CM | POA: Diagnosis not present

## 2019-11-05 DIAGNOSIS — M5459 Other low back pain: Secondary | ICD-10-CM | POA: Diagnosis not present

## 2019-11-05 DIAGNOSIS — Z8616 Personal history of COVID-19: Secondary | ICD-10-CM | POA: Diagnosis not present

## 2019-11-05 DIAGNOSIS — U071 COVID-19: Secondary | ICD-10-CM | POA: Diagnosis not present

## 2019-11-05 DIAGNOSIS — R634 Abnormal weight loss: Secondary | ICD-10-CM | POA: Diagnosis not present

## 2019-11-05 DIAGNOSIS — W19XXXA Unspecified fall, initial encounter: Secondary | ICD-10-CM | POA: Diagnosis not present

## 2019-11-05 DIAGNOSIS — R112 Nausea with vomiting, unspecified: Secondary | ICD-10-CM | POA: Diagnosis not present

## 2019-11-05 DIAGNOSIS — M6281 Muscle weakness (generalized): Secondary | ICD-10-CM | POA: Diagnosis not present

## 2019-11-05 DIAGNOSIS — G894 Chronic pain syndrome: Secondary | ICD-10-CM | POA: Diagnosis not present

## 2019-11-05 DIAGNOSIS — E46 Unspecified protein-calorie malnutrition: Secondary | ICD-10-CM | POA: Diagnosis not present

## 2019-11-05 DIAGNOSIS — R7309 Other abnormal glucose: Secondary | ICD-10-CM | POA: Diagnosis not present

## 2019-11-05 DIAGNOSIS — M48061 Spinal stenosis, lumbar region without neurogenic claudication: Secondary | ICD-10-CM | POA: Diagnosis not present

## 2019-11-05 DIAGNOSIS — L97514 Non-pressure chronic ulcer of other part of right foot with necrosis of bone: Secondary | ICD-10-CM | POA: Diagnosis not present

## 2019-11-05 DIAGNOSIS — L98419 Non-pressure chronic ulcer of buttock with unspecified severity: Secondary | ICD-10-CM | POA: Diagnosis not present

## 2019-11-05 DIAGNOSIS — F17200 Nicotine dependence, unspecified, uncomplicated: Secondary | ICD-10-CM | POA: Diagnosis not present

## 2019-11-05 DIAGNOSIS — F339 Major depressive disorder, recurrent, unspecified: Secondary | ICD-10-CM | POA: Diagnosis not present

## 2019-11-05 DIAGNOSIS — R627 Adult failure to thrive: Secondary | ICD-10-CM | POA: Diagnosis not present

## 2019-11-05 DIAGNOSIS — M542 Cervicalgia: Secondary | ICD-10-CM | POA: Diagnosis not present

## 2019-11-05 LAB — BASIC METABOLIC PANEL
Anion gap: 6 (ref 5–15)
BUN: 20 mg/dL (ref 8–23)
CO2: 22 mmol/L (ref 22–32)
Calcium: 7.9 mg/dL — ABNORMAL LOW (ref 8.9–10.3)
Chloride: 108 mmol/L (ref 98–111)
Creatinine, Ser: 1.06 mg/dL — ABNORMAL HIGH (ref 0.44–1.00)
GFR calc Af Amer: >60 mL/min (ref >60)
GFR calc non Af Amer: 55 mL/min — ABNORMAL LOW (ref >60)
Glucose, Bld: 84 mg/dL (ref 70–99)
Potassium: 4.6 mmol/L (ref 3.5–5.1)
Sodium: 136 mmol/L (ref 135–145)

## 2019-11-05 LAB — GLUCOSE, CAPILLARY
Glucose-Capillary: 126 mg/dL — ABNORMAL HIGH (ref 70–99)
Glucose-Capillary: 130 mg/dL — ABNORMAL HIGH (ref 70–99)
Glucose-Capillary: 80 mg/dL (ref 70–99)
Glucose-Capillary: 85 mg/dL (ref 70–99)

## 2019-11-05 LAB — CBC
HCT: 25 % — ABNORMAL LOW (ref 36.0–46.0)
Hemoglobin: 8 g/dL — ABNORMAL LOW (ref 12.0–15.0)
MCH: 33.3 pg (ref 26.0–34.0)
MCHC: 32 g/dL (ref 30.0–36.0)
MCV: 104.2 fL — ABNORMAL HIGH (ref 80.0–100.0)
Platelets: 377 10*3/uL (ref 150–400)
RBC: 2.4 MIL/uL — ABNORMAL LOW (ref 3.87–5.11)
RDW: 14 % (ref 11.5–15.5)
WBC: 8 10*3/uL (ref 4.0–10.5)
nRBC: 0 % (ref 0.0–0.2)

## 2019-11-05 NOTE — Plan of Care (Signed)
  Problem: Education: Goal: Knowledge of General Education information will improve Description Including pain rating scale, medication(s)/side effects and non-pharmacologic comfort measures Outcome: Progressing   

## 2019-11-05 NOTE — TOC Transition Note (Signed)
Transition of Care [facility]) - CM/SW Discharge Note   Patient Details  Name: Kelly [name] MRN: [id number] Date of Birth: 31-Aug-1955  Transition of Care [facility]) CM/SW Contact:  Lavon Paganini Phone Number: 11/05/2019, 9:46 AM   Clinical Narrative:    Patient will DC to: [facility] Transport by: Sharin Mons  RN, patient, patient's family, and facility notified of DC. Discharge Summary and FL2 sent to facility. RN to call report prior to discharge [phone] Rm [phone]). DC packet on chart.   CSW will sign off for now as social work intervention is no longer needed. Please consult [place] again if new needs arise.    Final next level of care: Skilled Nursing Facility Barriers to Discharge: No Barriers Identified   Patient Goals and CMS Choice   CMS Medicare.gov Compare Post Acute Care list provided to:: Patient Choice offered to / list presented to : Patient  Discharge Placement              Patient chooses bed at: [facility] Patient to be transferred to facility by: [facility] Name of family member notified: Domenic Schwab Kegg Patient and family notified of of transfer: 11/05/19  Discharge Plan and Services In-house Referral: Clinical Social Work Discharge Planning Services: CM Consult Post Acute Care Choice: Home Health          DME Arranged: Tube feeding DME Agency: Other - Comment [employer]) Date DME Agency Contacted: 10/28/19 Time DME Agency Contacted: 1721 Representative spoke with at DME Agency: Pam HH Arranged: RN, PT HH Agency: [facility] Date [facility] Agency Contacted: 10/28/19 Time HH Agency Contacted: 1721 Representative spoke with at [facility] Agency: Kandee Keen  Social Determinants of Health (SDOH) Interventions     Readmission Risk Interventions No flowsheet data found.

## 2019-11-05 NOTE — Progress Notes (Signed)
DISCHARGE NOTE SNF [facility] to be discharged Skilled nursing facility [facility] MD order. Patient verbalized understanding.  Skin clean, dry and intact without evidence of skin break down, no evidence of skin tears noted. IV catheter discontinued intact. Site without signs and symptoms of complications. Dressing and pressure applied. Pt denies pain at the site currently. No complaints noted.  Patient free of lines, drains, and wounds.   Discharge packet assembled. An After Visit Summary (AVS) was printed and given to the EMS personnel. Patient escorted via stretcher and discharged to [facility] via ambulance. Report called to accepting facility; all questions and concerns addressed.   Velia Meyer, RN

## 2019-11-09 DIAGNOSIS — R7309 Other abnormal glucose: Secondary | ICD-10-CM | POA: Diagnosis not present

## 2019-11-09 DIAGNOSIS — G8929 Other chronic pain: Secondary | ICD-10-CM | POA: Diagnosis not present

## 2019-11-09 DIAGNOSIS — Z931 Gastrostomy status: Secondary | ICD-10-CM | POA: Diagnosis not present

## 2019-11-09 DIAGNOSIS — R627 Adult failure to thrive: Secondary | ICD-10-CM | POA: Diagnosis not present

## 2019-11-09 DIAGNOSIS — R197 Diarrhea, unspecified: Secondary | ICD-10-CM | POA: Diagnosis not present

## 2019-11-09 DIAGNOSIS — F17200 Nicotine dependence, unspecified, uncomplicated: Secondary | ICD-10-CM | POA: Diagnosis not present

## 2019-11-10 DIAGNOSIS — Z720 Tobacco use: Secondary | ICD-10-CM | POA: Diagnosis not present

## 2019-11-10 DIAGNOSIS — R1312 Dysphagia, oropharyngeal phase: Secondary | ICD-10-CM | POA: Diagnosis not present

## 2019-11-10 DIAGNOSIS — R627 Adult failure to thrive: Secondary | ICD-10-CM | POA: Diagnosis not present

## 2019-11-10 DIAGNOSIS — L98419 Non-pressure chronic ulcer of buttock with unspecified severity: Secondary | ICD-10-CM | POA: Diagnosis not present

## 2019-11-10 DIAGNOSIS — J449 Chronic obstructive pulmonary disease, unspecified: Secondary | ICD-10-CM | POA: Diagnosis not present

## 2019-11-12 DIAGNOSIS — R5381 Other malaise: Secondary | ICD-10-CM | POA: Diagnosis not present

## 2019-11-12 DIAGNOSIS — J449 Chronic obstructive pulmonary disease, unspecified: Secondary | ICD-10-CM | POA: Diagnosis not present

## 2019-11-12 DIAGNOSIS — R509 Fever, unspecified: Secondary | ICD-10-CM | POA: Diagnosis not present

## 2019-11-12 DIAGNOSIS — U071 COVID-19: Secondary | ICD-10-CM | POA: Diagnosis not present

## 2019-11-12 DIAGNOSIS — R627 Adult failure to thrive: Secondary | ICD-10-CM | POA: Diagnosis not present

## 2019-11-14 DIAGNOSIS — R5381 Other malaise: Secondary | ICD-10-CM | POA: Diagnosis not present

## 2019-11-14 DIAGNOSIS — R5383 Other fatigue: Secondary | ICD-10-CM | POA: Diagnosis not present

## 2019-11-14 DIAGNOSIS — U071 COVID-19: Secondary | ICD-10-CM | POA: Diagnosis not present

## 2019-11-14 DIAGNOSIS — R627 Adult failure to thrive: Secondary | ICD-10-CM | POA: Diagnosis not present

## 2019-11-14 DIAGNOSIS — J449 Chronic obstructive pulmonary disease, unspecified: Secondary | ICD-10-CM | POA: Diagnosis not present

## 2019-11-17 DIAGNOSIS — J189 Pneumonia, unspecified organism: Secondary | ICD-10-CM | POA: Diagnosis not present

## 2019-11-17 DIAGNOSIS — R0602 Shortness of breath: Secondary | ICD-10-CM | POA: Diagnosis not present

## 2019-11-17 DIAGNOSIS — G8929 Other chronic pain: Secondary | ICD-10-CM | POA: Diagnosis not present

## 2019-11-17 DIAGNOSIS — J441 Chronic obstructive pulmonary disease with (acute) exacerbation: Secondary | ICD-10-CM | POA: Diagnosis not present

## 2019-11-17 DIAGNOSIS — U071 COVID-19: Secondary | ICD-10-CM | POA: Diagnosis not present

## 2019-11-17 DIAGNOSIS — J9612 Chronic respiratory failure with hypercapnia: Secondary | ICD-10-CM | POA: Diagnosis not present

## 2019-11-23 DIAGNOSIS — G8929 Other chronic pain: Secondary | ICD-10-CM | POA: Diagnosis not present

## 2019-11-23 DIAGNOSIS — R112 Nausea with vomiting, unspecified: Secondary | ICD-10-CM | POA: Diagnosis not present

## 2019-11-23 DIAGNOSIS — U071 COVID-19: Secondary | ICD-10-CM | POA: Diagnosis not present

## 2019-11-23 DIAGNOSIS — J441 Chronic obstructive pulmonary disease with (acute) exacerbation: Secondary | ICD-10-CM | POA: Diagnosis not present

## 2019-11-23 DIAGNOSIS — J189 Pneumonia, unspecified organism: Secondary | ICD-10-CM | POA: Diagnosis not present

## 2019-11-26 DIAGNOSIS — G8929 Other chronic pain: Secondary | ICD-10-CM | POA: Diagnosis not present

## 2019-11-26 DIAGNOSIS — J441 Chronic obstructive pulmonary disease with (acute) exacerbation: Secondary | ICD-10-CM | POA: Diagnosis not present

## 2019-11-26 DIAGNOSIS — J189 Pneumonia, unspecified organism: Secondary | ICD-10-CM | POA: Diagnosis not present

## 2019-11-26 DIAGNOSIS — U071 COVID-19: Secondary | ICD-10-CM | POA: Diagnosis not present

## 2019-11-29 DIAGNOSIS — J189 Pneumonia, unspecified organism: Secondary | ICD-10-CM | POA: Diagnosis not present

## 2019-11-29 DIAGNOSIS — G8929 Other chronic pain: Secondary | ICD-10-CM | POA: Diagnosis not present

## 2019-11-29 DIAGNOSIS — J441 Chronic obstructive pulmonary disease with (acute) exacerbation: Secondary | ICD-10-CM | POA: Diagnosis not present

## 2019-11-29 DIAGNOSIS — U071 COVID-19: Secondary | ICD-10-CM | POA: Diagnosis not present

## 2019-12-01 ENCOUNTER — Telehealth: Payer: Self-pay | Admitting: Cardiovascular Disease

## 2019-12-01 NOTE — Telephone Encounter (Signed)
Called patient to attempt to schedule follow up visit with [facility] from recall list, patient answered and stated she couldn't hear and hung up after I introduce myself to inform patient I was calling from [facility]

## 2019-12-06 DIAGNOSIS — R197 Diarrhea, unspecified: Secondary | ICD-10-CM | POA: Diagnosis not present

## 2019-12-06 DIAGNOSIS — W19XXXA Unspecified fall, initial encounter: Secondary | ICD-10-CM | POA: Diagnosis not present

## 2019-12-06 DIAGNOSIS — D72829 Elevated white blood cell count, unspecified: Secondary | ICD-10-CM | POA: Diagnosis not present

## 2019-12-06 DIAGNOSIS — R1084 Generalized abdominal pain: Secondary | ICD-10-CM | POA: Diagnosis not present

## 2019-12-06 DIAGNOSIS — G8929 Other chronic pain: Secondary | ICD-10-CM | POA: Diagnosis not present

## 2019-12-06 DIAGNOSIS — K5909 Other constipation: Secondary | ICD-10-CM | POA: Diagnosis not present

## 2019-12-06 DIAGNOSIS — M542 Cervicalgia: Secondary | ICD-10-CM | POA: Diagnosis not present

## 2019-12-06 DIAGNOSIS — R112 Nausea with vomiting, unspecified: Secondary | ICD-10-CM | POA: Diagnosis not present

## 2019-12-08 DIAGNOSIS — G8929 Other chronic pain: Secondary | ICD-10-CM | POA: Diagnosis not present

## 2019-12-08 DIAGNOSIS — M62838 Other muscle spasm: Secondary | ICD-10-CM | POA: Diagnosis not present

## 2019-12-08 DIAGNOSIS — M542 Cervicalgia: Secondary | ICD-10-CM | POA: Diagnosis not present

## 2019-12-08 DIAGNOSIS — L97519 Non-pressure chronic ulcer of other part of right foot with unspecified severity: Secondary | ICD-10-CM | POA: Diagnosis not present

## 2019-12-13 DIAGNOSIS — M5459 Other low back pain: Secondary | ICD-10-CM | POA: Diagnosis not present

## 2019-12-13 DIAGNOSIS — R262 Difficulty in walking, not elsewhere classified: Secondary | ICD-10-CM | POA: Diagnosis not present

## 2019-12-13 DIAGNOSIS — R202 Paresthesia of skin: Secondary | ICD-10-CM | POA: Diagnosis not present

## 2019-12-13 DIAGNOSIS — M6281 Muscle weakness (generalized): Secondary | ICD-10-CM | POA: Diagnosis not present

## 2019-12-15 DIAGNOSIS — L97519 Non-pressure chronic ulcer of other part of right foot with unspecified severity: Secondary | ICD-10-CM | POA: Diagnosis not present

## 2019-12-19 DIAGNOSIS — R140 Abdominal distension (gaseous): Secondary | ICD-10-CM | POA: Diagnosis not present

## 2019-12-19 DIAGNOSIS — K5909 Other constipation: Secondary | ICD-10-CM | POA: Diagnosis not present

## 2019-12-19 DIAGNOSIS — G894 Chronic pain syndrome: Secondary | ICD-10-CM | POA: Diagnosis not present

## 2019-12-19 DIAGNOSIS — G609 Hereditary and idiopathic neuropathy, unspecified: Secondary | ICD-10-CM | POA: Diagnosis not present

## 2019-12-20 DIAGNOSIS — R262 Difficulty in walking, not elsewhere classified: Secondary | ICD-10-CM | POA: Diagnosis not present

## 2019-12-20 DIAGNOSIS — R202 Paresthesia of skin: Secondary | ICD-10-CM | POA: Diagnosis not present

## 2019-12-20 DIAGNOSIS — M5459 Other low back pain: Secondary | ICD-10-CM | POA: Diagnosis not present

## 2019-12-20 DIAGNOSIS — M6281 Muscle weakness (generalized): Secondary | ICD-10-CM | POA: Diagnosis not present

## 2019-12-21 NOTE — Telephone Encounter (Signed)
error 

## 2019-12-27 DIAGNOSIS — M5459 Other low back pain: Secondary | ICD-10-CM | POA: Diagnosis not present

## 2019-12-27 DIAGNOSIS — R202 Paresthesia of skin: Secondary | ICD-10-CM | POA: Diagnosis not present

## 2019-12-27 DIAGNOSIS — M6281 Muscle weakness (generalized): Secondary | ICD-10-CM | POA: Diagnosis not present

## 2019-12-27 DIAGNOSIS — R262 Difficulty in walking, not elsewhere classified: Secondary | ICD-10-CM | POA: Diagnosis not present

## 2019-12-29 DIAGNOSIS — R5381 Other malaise: Secondary | ICD-10-CM | POA: Diagnosis not present

## 2019-12-29 DIAGNOSIS — G8929 Other chronic pain: Secondary | ICD-10-CM | POA: Diagnosis not present

## 2019-12-29 DIAGNOSIS — E46 Unspecified protein-calorie malnutrition: Secondary | ICD-10-CM | POA: Diagnosis not present

## 2019-12-29 DIAGNOSIS — R202 Paresthesia of skin: Secondary | ICD-10-CM | POA: Diagnosis not present

## 2019-12-29 DIAGNOSIS — Z8616 Personal history of COVID-19: Secondary | ICD-10-CM | POA: Diagnosis not present

## 2019-12-29 DIAGNOSIS — R634 Abnormal weight loss: Secondary | ICD-10-CM | POA: Diagnosis not present

## 2019-12-29 DIAGNOSIS — M5459 Other low back pain: Secondary | ICD-10-CM | POA: Diagnosis not present

## 2019-12-29 DIAGNOSIS — R1314 Dysphagia, pharyngoesophageal phase: Secondary | ICD-10-CM | POA: Diagnosis not present

## 2019-12-29 DIAGNOSIS — R627 Adult failure to thrive: Secondary | ICD-10-CM | POA: Diagnosis not present

## 2019-12-29 DIAGNOSIS — R262 Difficulty in walking, not elsewhere classified: Secondary | ICD-10-CM | POA: Diagnosis not present

## 2019-12-29 DIAGNOSIS — F17200 Nicotine dependence, unspecified, uncomplicated: Secondary | ICD-10-CM | POA: Diagnosis not present

## 2019-12-29 DIAGNOSIS — J449 Chronic obstructive pulmonary disease, unspecified: Secondary | ICD-10-CM | POA: Diagnosis not present

## 2019-12-29 DIAGNOSIS — M6281 Muscle weakness (generalized): Secondary | ICD-10-CM | POA: Diagnosis not present

## 2020-01-01 DIAGNOSIS — R627 Adult failure to thrive: Secondary | ICD-10-CM | POA: Diagnosis not present

## 2020-01-01 DIAGNOSIS — M6281 Muscle weakness (generalized): Secondary | ICD-10-CM | POA: Diagnosis not present

## 2020-01-01 DIAGNOSIS — J449 Chronic obstructive pulmonary disease, unspecified: Secondary | ICD-10-CM | POA: Diagnosis not present

## 2020-01-01 DIAGNOSIS — G8929 Other chronic pain: Secondary | ICD-10-CM | POA: Diagnosis not present

## 2020-01-02 DIAGNOSIS — M48061 Spinal stenosis, lumbar region without neurogenic claudication: Secondary | ICD-10-CM | POA: Diagnosis not present

## 2020-01-03 ENCOUNTER — Other Ambulatory Visit: Payer: Self-pay

## 2020-01-03 NOTE — Patient Outreach (Signed)
[facility]) Care Management  01/03/2020  Kelly [name] 12/06/1955 [id number]     Transition of Care Referral  Referral Date: 01/03/2020 Referral Source: [facility] Discharge Report Date of Discharge: 12/30/2019 Facility: [facility] Insurance: [facility]    Referral received. Transition of care calls being completed via EMMI-automated calls. RN CM will outreach patient for any red flags received.     Plan: RN CM will close case at this time.    Antionette Fairy, RN,BSN,CCM [facility] Care Management Telephonic Care Management Coordinator Direct Phone: [phone] Toll Free: [phone] Fax: [phone]

## 2020-01-13 ENCOUNTER — Encounter: Payer: Self-pay | Admitting: General Practice

## 2020-01-13 ENCOUNTER — Telehealth: Payer: Self-pay | Admitting: Cardiovascular Disease

## 2020-01-13 NOTE — Telephone Encounter (Signed)
  Recall expunge letter sent for overdue f/u 

## 2020-01-17 ENCOUNTER — Telehealth: Payer: Self-pay

## 2020-01-17 NOTE — Telephone Encounter (Signed)
1) Medication(s) Requested (by name):metoprolol succinate (TOPROL-XL) 25 MG 24 hr tablet  levothyroxine (EUTHYROX) 25 MCG tablet  citalopram (CELEXA) 20 MG  nitroGLYCERIN (NITROSTAT) 0.4 MG SL tablet ondansetron (ZOFRAN ODT) 4 MG disintegrating tablet pantoprazole (PROTONIX) 40 MG tablet    2) Pharmacy of Choice:  3) Special Requests:   Approved medications will be sent to the pharmacy, we will reach out if there is an issue.  Requests made after 3pm may not be addressed until the following business day!  If a patient is unsure of the name of the medication(s) please note and ask patient to call back when they are able to provide all info, do not send to responsible party until all information is available!

## 2020-01-18 NOTE — Telephone Encounter (Signed)
Will address this during visit with provider. Patient has been in hospital & a rehab center & there have been changes to medication. Need to reconcile medications.

## 2020-01-19 ENCOUNTER — Telehealth: Payer: Medicare HMO | Admitting: Internal Medicine

## 2020-01-19 DIAGNOSIS — R634 Abnormal weight loss: Secondary | ICD-10-CM

## 2020-01-19 DIAGNOSIS — K591 Functional diarrhea: Secondary | ICD-10-CM

## 2020-01-19 DIAGNOSIS — Z09 Encounter for follow-up examination after completed treatment for conditions other than malignant neoplasm: Secondary | ICD-10-CM

## 2020-01-19 DIAGNOSIS — K2100 Gastro-esophageal reflux disease with esophagitis, without bleeding: Secondary | ICD-10-CM

## 2020-01-22 DIAGNOSIS — R3981 Functional urinary incontinence: Secondary | ICD-10-CM | POA: Diagnosis not present

## 2020-01-23 ENCOUNTER — Telehealth: Payer: Medicare HMO | Admitting: Internal Medicine

## 2020-01-23 DIAGNOSIS — Z09 Encounter for follow-up examination after completed treatment for conditions other than malignant neoplasm: Secondary | ICD-10-CM

## 2020-01-23 DIAGNOSIS — K2100 Gastro-esophageal reflux disease with esophagitis, without bleeding: Secondary | ICD-10-CM

## 2020-01-23 DIAGNOSIS — R634 Abnormal weight loss: Secondary | ICD-10-CM

## 2020-01-23 DIAGNOSIS — K591 Functional diarrhea: Secondary | ICD-10-CM

## 2020-02-01 ENCOUNTER — Telehealth: Payer: Medicare HMO

## 2020-02-01 ENCOUNTER — Other Ambulatory Visit: Payer: Self-pay

## 2020-02-01 ENCOUNTER — Telehealth (INDEPENDENT_AMBULATORY_CARE_PROVIDER_SITE_OTHER): Payer: Medicare HMO | Admitting: Physician Assistant

## 2020-02-01 DIAGNOSIS — F32A Depression, unspecified: Secondary | ICD-10-CM | POA: Diagnosis not present

## 2020-02-01 DIAGNOSIS — R634 Abnormal weight loss: Secondary | ICD-10-CM | POA: Diagnosis not present

## 2020-02-01 DIAGNOSIS — R159 Full incontinence of feces: Secondary | ICD-10-CM

## 2020-02-01 DIAGNOSIS — Z993 Dependence on wheelchair: Secondary | ICD-10-CM

## 2020-02-01 DIAGNOSIS — R1310 Dysphagia, unspecified: Secondary | ICD-10-CM | POA: Diagnosis not present

## 2020-02-01 DIAGNOSIS — E440 Moderate protein-calorie malnutrition: Secondary | ICD-10-CM

## 2020-02-01 DIAGNOSIS — I10 Essential (primary) hypertension: Secondary | ICD-10-CM | POA: Diagnosis not present

## 2020-02-01 DIAGNOSIS — Z09 Encounter for follow-up examination after completed treatment for conditions other than malignant neoplasm: Secondary | ICD-10-CM

## 2020-02-01 DIAGNOSIS — K209 Esophagitis, unspecified without bleeding: Secondary | ICD-10-CM

## 2020-02-01 DIAGNOSIS — E876 Hypokalemia: Secondary | ICD-10-CM

## 2020-02-01 MED ORDER — CITALOPRAM HYDROBROMIDE 20 MG PO TABS: 20.0000 mg | ORAL_TABLET | Freq: Every day | ORAL | 0 refills | Status: DC

## 2020-02-01 MED ORDER — PANTOPRAZOLE SODIUM 40 MG PO TBEC: 40.0000 mg | DELAYED_RELEASE_TABLET | Freq: Every day | ORAL | 0 refills | Status: DC

## 2020-02-01 MED ORDER — PREGABALIN 100 MG PO CAPS: 100.0000 mg | ORAL_CAPSULE | Freq: Every day | ORAL | 1 refills | Status: DC

## 2020-02-01 MED ORDER — METHOCARBAMOL 500 MG PO TABS: 500.0000 mg | ORAL_TABLET | Freq: Two times a day (BID) | ORAL | 0 refills | Status: DC | PRN

## 2020-02-01 MED ORDER — TRAMADOL HCL 50 MG PO TABS: 50.0000 mg | ORAL_TABLET | Freq: Three times a day (TID) | ORAL | 0 refills | Status: AC | PRN

## 2020-02-01 MED ORDER — LEVOTHYROXINE SODIUM 25 MCG PO TABS: 25.0000 ug | ORAL_TABLET | Freq: Every day | ORAL | 0 refills | Status: DC

## 2020-02-01 MED ORDER — METOPROLOL SUCCINATE ER 25 MG PO TB24: 12.5000 mg | ORAL_TABLET | Freq: Every day | ORAL | 1 refills | Status: DC

## 2020-02-01 NOTE — Progress Notes (Deleted)
Patient ID: Kelly [name], female   DOB: June 01, 1955, 64 y.o.   MRN: [id number] 1. [facility] in 1 month for chronic diarrhea, and dysphagia 2. PCP in 1 week   Discharge Diagnoses:  Severe oropharyngeal and esophageal dysphagia Severe protein calorie malnutrition Adult failure to thrive Hyponatremia Hyperkalemia Aspiration pneumonia Chronic diarrhea Iron deficiency anemia COPD Heavy tobacco abuse Suspected interstitial lung disease Depression CAD   Dysphagia   Unintentional weight loss   Colitis   Hypokalemia   History of present illness:  Kelly [name] an 64 y.o.femalewith history of COPD, tobacco abuse, chronic diastolic CHF, CAD/PCI and stents, hypertension, dyslipidemia presented to the emergency room 8/20 with worsening weakness, nausea, dysphagia and odynophagia, poor oral intake for over 2-3 months, this has been slowly progressive. ,  Associated with 30 pound weight loss  Hospital Course:   Severe oropharyngeal and esophageal dysphagia Severe protein calorie malnutrition/weight loss Adult failure to thrive -History of dysphagiaandnauseaprogressively worsening for 2-3 months associated with 30 pound weight loss [facility] gastroenterology consulted, CT abdomen pelvis was unremarkable,, endoscopy 8/21 was unremarkable, follow-up biopsy, Ba esophagram w/out overt findings, limited evaluation of dysmotility -candida noted on path-treated with fluconazole -MBS: Dysphagia, pharyngoesophageal phase: dys 1 thin liquids -Prior history of cervical hardware/cervical spine surgery also contributing to dysphagia -TSH and random cortisol within normal limits -startedmarinol, scheduled zofran -Oral intake remains very poor and given her poor social support with essentially bedbound status after much discussion with patient, PEG tube was recommended per GI and placed by interventional radiology on 8/27 -Continue tube feeds and dysphagia1diet -Discharged to SNF for  short-term rehab, close follow-up with PCP in 1 week and [facility] in 1 month  Hyponatremia -improved with free water  Hyperkalemia Mild acute kidney injury Metabolic acidosis -Likely from chronic diarrhea -Hydrated with IV fluids, add more free water -resolved, losartan discontinued  Aspiration pna/leukocytosis -During this hospitalization she developed cough, dyspnea and x-ray noted left side infiltrate,  -Subsequently started on antibiotics for pneumonia aspiration pneumonia clinically improved, completed 6-day course today  Chronic diarrhea -CT abdomen with questionable mild colitis, clinically do not suspect active colitis -Antibiotics discontinued, could be malabsorption related -C. 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 Heavy tobacco abuse -Concern for interstitial lung disease based on CT, no wheezing or oxygen requirement at this time -Counseled, continue nebs as needed -Recommended pulmonary follow-up  Depression -continue celexa for now -Psych recommended to continue Celexa and follow-up with outpatient psychiatry  CAD History of PCI/stents -Stable  Severe protein calorie malnutrition -Now on tube feeds  Pressure Injury 10/22/19 Hip Left Unstageable - Full thickness tissue loss in which the base of the injury is covered by slough (yellow, tan, gray, green or brown) and/or eschar (tan, brown or black) in the wound bed. (Active)  10/22/19 0010  Location: Hip  Location Orientation: Left  Staging: Unstageable - Full thickness tissue loss in which the base of the injury is covered by slough (yellow, tan, gray, green or brown)  and/or eschar (tan, brown or black) in the wound bed.  Wound Description (Comments):   Present on Admission: yes

## 2020-02-01 NOTE — Progress Notes (Signed)
Patient ID: Kelly [name], female   DOB: [birth year], 64 y.o.   MRN: [id number]    Virtual Visit via Telephone Note  I connected with [name] on 02/01/20 at  2:10 PM EST by telephone and verified that I am speaking with the correct person using two identifiers.  Location: Patient: Kelly [name] Provider: Georgian Co, PA-C   I discussed the limitations, risks, security and privacy concerns of performing an evaluation and management service by telephone and the availability of in person appointments. I also discussed with the patient that there may be a patient responsible charge related to this service. The patient expressed understanding and agreed to proceed.  PATIENT visit by telephone virtually in the context of Covid-19 pandemic. Patient location:  home My Location:  [facility] office Persons on the call:  Me and the patient;  Screened by Laurena Bering     History of Present Illness: After hospitalization 8/20-9/05/2019.  Weighed 108 on 10/24/2019; weighed 129 on discharge on 11/04/2019.  She has not weighed since then and is bed bound.  Needs a RF on meds.  Has not seen GI since discharge and still has PEG tube in place.  Very difficult to obtain history from.  She says she is staying with friends and on the second floor and can't leave the house but she has also refused home health bc "there's no one here in the daytime to put up the dogs."  No fever.  Swallowing is a little better.  dperession is about the same.    From discharge summary: for Outpatient Follow-up:  1. [facility] in 1 month for chronic diarrhea, and dysphagia 2. PCP in 1 week   Discharge Diagnoses:  Severe oropharyngeal and esophageal dysphagia Severe protein calorie malnutrition Adult failure to thrive Hyponatremia Hyperkalemia Aspiration pneumonia Chronic diarrhea Iron deficiency anemia COPD Heavy tobacco abuse Suspected interstitial lung disease Depression CAD   Dysphagia   Unintentional weight  loss   Colitis   Hypokalemia  History of present illness:  Kelly [name] an 64 y.o.femalewith history of COPD, tobacco abuse, chronic diastolic CHF, CAD/PCI and stents, hypertension, dyslipidemia presented to the emergency room 8/20 with worsening weakness, nausea, dysphagia and odynophagia, poor oral intake for over 2-3 months, this has been slowly progressive. ,  Associated with 30 pound weight loss  Hospital Course:   Severe oropharyngeal and esophageal dysphagia Severe protein calorie malnutrition/weight loss Adult failure to thrive -History of dysphagiaandnauseaprogressively worsening for 2-3 months associated with 30 pound weight loss [facility] gastro[facility] consulted, CT abdomen pelvis was unremarkable,, endoscopy 8/21 was unremarkable, follow-up biopsy, Ba esophagram w/out overt findings, limited evaluation of dysmotility -candida noted on path-treated with fluconazole -MBS: Dysphagia, pharyngoesophageal phase: dys 1 thin liquids -Prior history of cervical hardware/cervical spine surgery also contributing to dysphagia -TSH and random cortisol within normal limits -startedmarinol, scheduled zofran -Oral intake remains very poor and given her poor social support with essentially bedbound status after much discussion with patient, PEG tube was recommended per GI and placed by interventional radiology on 8/27 -Continue tube feeds and dysphagia1diet -Discharged to SNF for short-term rehab, close follow-up with PCP in 1 week and [facility] in 1 month  Hyponatremia -improved with free water  Hyperkalemia Mild acute kidney injury Metabolic acidosis -Likely from chronic diarrhea -Hydrated with IV fluids, add more free water -resolved, losartan discontinued  Aspiration pna/leukocytosis -During this hospitalization she developed cough, dyspnea and x-ray noted left side infiltrate,  -Subsequently started on antibiotics for pneumonia aspiration pneumonia  clinically improved,  completed 6-day course today  Chronic diarrhea -CT abdomen with questionable mild colitis, clinically do not suspect active colitis -Antibiotics discontinued, could be malabsorption related -C. 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  poor J/I with need for medical attention and assessment.  Refer to social work to see if they can assist with some of her needs.    Assessment and Plan: 1. Hospital discharge follow-up We will try and obtain home health and she can come in at her earliest convenience for recheck of labs.  Patient has poor access to care.  Concern for poor outcome due to lack of understanding and mobility issues  2. Depression, unspecified depression type Stable-continue - citalopram (CELEXA) 20 MG tablet; Take 1 tablet (20 mg total) by mouth at bedtime.  Dispense: 90 tablet; Refill: 0  3. Essential hypertension continue - metoprolol succinate (TOPROL-XL) 25 MG 24 hr tablet; Take 0.5 tablets (12.5 mg total) by mouth daily.  Dispense: 45 tablet; Refill: 1 - Comprehensive metabolic panel; Future - CBC with Differential/Platelet; Future  4. Unintentional weight loss Still with PEG tube/has not had f/up - Ambulatory referral to Gastroenterology-urgent - CBC  with Differential/Platelet; Future  5. Hypokalemia - Comprehensive metabolic panel; Future  6. Malnutrition of moderate degree Proper nutrition and water intake discussed.   - Comprehensive metabolic panel; Future - Ambulatory referral to Gastroenterology - CBC with Differential/Platelet; Future  7. Dysphagia, unspecified type - Ambulatory referral to Gastroenterology   Follow Up Instructions: See PCP or allow home health to assess   I discussed the assessment and treatment plan with the patient. The patient was provided an opportunity to ask questions and all were answered. The patient agreed with the plan and demonstrated an understanding of the instructions.   The patient was advised to call back or seek an in-person evaluation if the symptoms worsen or if the condition fails to improve as anticipated.  I provided 24 minutes of non-face-to-face time during this encounter.   Georgian Co, PA-C

## 2020-02-02 ENCOUNTER — Other Ambulatory Visit: Payer: Self-pay | Admitting: Internal Medicine

## 2020-02-02 DIAGNOSIS — I10 Essential (primary) hypertension: Secondary | ICD-10-CM

## 2020-02-14 ENCOUNTER — Ambulatory Visit (INDEPENDENT_AMBULATORY_CARE_PROVIDER_SITE_OTHER): Payer: Medicare HMO | Admitting: Licensed Clinical Social Worker

## 2020-02-14 ENCOUNTER — Other Ambulatory Visit: Payer: Self-pay

## 2020-02-14 DIAGNOSIS — F411 Generalized anxiety disorder: Secondary | ICD-10-CM

## 2020-02-14 DIAGNOSIS — F331 Major depressive disorder, recurrent, moderate: Secondary | ICD-10-CM | POA: Diagnosis not present

## 2020-02-14 NOTE — BH Specialist Note (Signed)
Integrated Behavioral Health via Telemedicine Visit  02/14/2020 Kelly [name] [id number]  Number of Integrated Behavioral Health visits: 1 Session Start time: 1:40 PM  Session End time: 2:10 PM Total time: 30  Referring Provider: Dr. Earlene Plater Patient location: Home [facility] Provider location: Office All persons participating in visit: LCSW and Patient Types of Service: Individual psychotherapy  I connected with Kelly [name] by Telephone and verified that I am speaking with the correct person using two identifiers.    Discussed confidentiality: Yes   I discussed the limitations of telemedicine and the availability of in person appointments.  Discussed there is a possibility of technology failure and discussed alternative modes of communication if that failure occurs.  I discussed that engaging in this telemedicine visit, they consent to the provision of behavioral healthcare and the services will be billed under their insurance.  Patient and/or legal guardian expressed understanding and consented to Telemedicine visit: Yes   Presenting Concerns: Patient and/or family reports the following symptoms/concerns: Pt reports difficulty managing depression and anxiety symptoms triggered by chronic medical conditions and pain. Pt is bed bound and is at home majority of the day without any support.  Duration of problem: Ongoing; Pt has been participating in medication management "for a very long time" States that valium was helpful in management of symptoms; Severity of problem: severe  Patient and/or Family's Strengths/Protective Factors: Social connections Pt resides with friends. She receives strong support from son and grandson, who checks in on her  Goals Addressed: Patient will: 1.  Reduce symptoms of: anxiety and depression Pt agreed to continue compliance with medications and utilize healthy coping skills (listening to music and talking with grandson) 2.  Demonstrate ability to: Increase healthy  adjustment to current life circumstances and Increase adequate support systems for patient/family Pt agreed to referral to psychiatry to adjust medications. Pt also agreed for LCSW to consult with RN CM, to inquire about services to strengthen support  Progress towards Goals: Ongoing  Interventions: Interventions utilized:  Solution-Focused Strategies, Supportive Counseling and Link to [employer] Standardized Assessments completed: Not Needed  Patient and/or Family Response: Pt was engaged during session and was appreciative for services offered.  Assessment: Patient currently experiencing difficulty managing depression and anxiety symptoms triggered by chronic pain and ongoing medical conditions that has limited her mobility. Last fall reported two days ago noting pt was trying to get back in bed for approx 2 hours prior to son coming to assist. Pt does not have a cell phone, utilizes home phone in an emergency. Pt is requesting assistance with obtaining a gel mattress to promote comfort.  Patient may benefit from in-home services, therapy and continued medication management. Pt reports that current medications for behavioral health are not working and pain medication (tramadol) is not effective.   LCSW discussed various supportive resources (Personal Care Services, CAPS, Psych referral, housing) Pt reports a barrier to treatment are three pit bulls that roam freely in the home. States that aids would be fearful of roommates pets.   Plan: 1. Follow up with behavioral health clinician on : LCSW will contact patient with supportive resources 2. Behavioral recommendations: Utilize strategies discussed and continue with medication management 3. Referral(s): Integrated [employer] (In Clinic)  I discussed the assessment and treatment plan with the patient and/or parent/guardian. They were provided an opportunity to ask questions and all were answered. They agreed with the  plan and demonstrated an understanding of the instructions.   They were advised to call back or  seek an in-person evaluation if the symptoms worsen or if the condition fails to improve as anticipated.  Bridgett Larsson, LCSW  02/16/2020 9:31 AM

## 2020-02-21 DIAGNOSIS — R3981 Functional urinary incontinence: Secondary | ICD-10-CM | POA: Diagnosis not present

## 2020-02-22 ENCOUNTER — Ambulatory Visit: Payer: Medicare HMO | Admitting: Internal Medicine

## 2020-02-22 ENCOUNTER — Telehealth: Payer: Self-pay

## 2020-02-22 NOTE — Telephone Encounter (Signed)
Pt called about her in-person saying it was supposed to be a tele and I informed the patient that she would need to be seen in person Dr. Earlene Plater told her last visit she couldn't do any more tele appointments with her. Patient got upset and said she cant get out her home she would need an ambulance to bring her. I informed her she had to be seen in person per Dr. Earlene Plater. Pt sated she was [place] then.

## 2020-02-28 ENCOUNTER — Ambulatory Visit (INDEPENDENT_AMBULATORY_CARE_PROVIDER_SITE_OTHER): Payer: Medicare HMO | Admitting: Licensed Clinical Social Worker

## 2020-02-28 ENCOUNTER — Other Ambulatory Visit: Payer: Self-pay

## 2020-02-28 DIAGNOSIS — Z748 Other problems related to care provider dependency: Secondary | ICD-10-CM

## 2020-02-28 NOTE — BH Specialist Note (Signed)
Follow up call placed to patient. She reports that "things have been good" Pt stated that she hasn't been able to contact [facility] yet to schedule transportation to upcoming appointment with PCP. Pt verbalized plan to call one week in advance from appointment date.   LCSW agreed to re-send information on the [employer] with information on [employer] phone number to call and register.   No additional concerns noted.

## 2020-03-06 ENCOUNTER — Telehealth: Payer: Self-pay | Admitting: Internal Medicine

## 2020-03-06 NOTE — Telephone Encounter (Signed)
Called patient and asked to reschedule appt for Jan 11th due to Dr. Earlene Plater being out of office. Patient stated she did not know when a good time to reschedule would be and that she would call back when she knew.

## 2020-03-13 ENCOUNTER — Ambulatory Visit: Payer: Medicare HMO | Admitting: Internal Medicine

## 2020-03-14 ENCOUNTER — Other Ambulatory Visit: Payer: Self-pay | Admitting: Internal Medicine

## 2020-03-14 MED ORDER — PREGABALIN 100 MG PO CAPS: 100.0000 mg | ORAL_CAPSULE | Freq: Every day | ORAL | 1 refills | Status: AC

## 2020-03-16 ENCOUNTER — Telehealth: Payer: Self-pay | Admitting: Internal Medicine

## 2020-03-16 NOTE — Telephone Encounter (Signed)
[facility] is calling for the approval of the following refills:  pregabalin pregabalin (LYRICA) 100 MG capsule  and traMADol (ULTRAM) 50 MG tablet [[id number]]  Please advise and thank you

## 2020-03-17 NOTE — Telephone Encounter (Signed)
Please call patient with update.    Pregabalin refilled on 03/14/2020 for a 6 month supply. Will not be able to refill Tramadol at this time.

## 2020-03-22 DIAGNOSIS — R3981 Functional urinary incontinence: Secondary | ICD-10-CM | POA: Diagnosis not present

## 2020-04-23 ENCOUNTER — Other Ambulatory Visit: Payer: Self-pay | Admitting: Physician Assistant

## 2020-04-23 DIAGNOSIS — E039 Hypothyroidism, unspecified: Secondary | ICD-10-CM

## 2020-04-23 DIAGNOSIS — F32A Depression, unspecified: Secondary | ICD-10-CM

## 2020-04-27 NOTE — Telephone Encounter (Signed)
Prescribed 30 day courtesy refill for Citalopram and Levothyroxine. Needs appointment with primary physician for additional refills.   Methocarbamol (Robaxin) refused as patient was recently prescribed a 90 day supply.

## 2020-04-29 DIAGNOSIS — R3981 Functional urinary incontinence: Secondary | ICD-10-CM | POA: Diagnosis not present

## 2020-05-02 ENCOUNTER — Telehealth: Payer: Self-pay | Admitting: Internal Medicine

## 2020-05-02 NOTE — Telephone Encounter (Signed)
Please update [employer] the Methocarbamol refill quantity should be 0. Patient should schedule appointment with primary provider for refills.

## 2020-05-02 NOTE — Telephone Encounter (Signed)
Pls review and advise of correct quantity, thanks

## 2020-05-02 NOTE — Telephone Encounter (Signed)
[employer] is calling to verify amount of medication for methocarbamol (ROBAXIN) 500 MG tablet, if 1 tablet is correct? Please advise and thank you

## 2020-05-02 NOTE — Telephone Encounter (Signed)
Pharmacy aware

## 2020-05-09 ENCOUNTER — Other Ambulatory Visit: Payer: Self-pay | Admitting: Physician Assistant

## 2020-05-23 ENCOUNTER — Other Ambulatory Visit: Payer: Self-pay | Admitting: Family

## 2020-05-23 DIAGNOSIS — F32A Depression, unspecified: Secondary | ICD-10-CM

## 2020-05-23 DIAGNOSIS — E039 Hypothyroidism, unspecified: Secondary | ICD-10-CM

## 2020-05-28 ENCOUNTER — Telehealth: Payer: Self-pay | Admitting: Internal Medicine

## 2020-05-28 NOTE — Telephone Encounter (Signed)
New message    Patient did not disclose any information asking for a call back from the social worker

## 2020-05-28 NOTE — Telephone Encounter (Signed)
Please approve refills if appropriate at this time.

## 2020-05-29 DIAGNOSIS — R3981 Functional urinary incontinence: Secondary | ICD-10-CM | POA: Diagnosis not present

## 2020-05-29 NOTE — Telephone Encounter (Signed)
Will f/u, route to [facility] to f/u.

## 2020-05-31 ENCOUNTER — Telehealth: Payer: Self-pay

## 2020-05-31 NOTE — Telephone Encounter (Signed)
Levothyroxine and Citalopram prescribed for 30 day supply.   Patient's last thyroid lab work in August 2021. Please schedule office visit for additional refills.

## 2020-05-31 NOTE — Telephone Encounter (Signed)
Amy can you take a look at this

## 2020-05-31 NOTE — Telephone Encounter (Signed)
Followed up with patient by phone on request to speak with a Child psychotherapist. Patient needed medications refilled and to schedule a virtual visit with her PCP. Patient has a phone that does not ring and would like to schedule an virtual appointment soon. Medications needing a refill were sent to [facility] Nurse. Will follow up on appt date and time with the provider.

## 2020-06-01 NOTE — Telephone Encounter (Signed)
Pt requesting a virtual appt with Dr. Earlene Plater. Pt. states she has a government phone that does not ring when its called. She would like to call to the clinic for her virtual appointment. Would you call and schedule a visit with Dr. Earlene Plater with the patient?

## 2020-06-04 ENCOUNTER — Other Ambulatory Visit: Payer: Self-pay | Admitting: Internal Medicine

## 2020-06-04 DIAGNOSIS — I10 Essential (primary) hypertension: Secondary | ICD-10-CM

## 2020-06-04 MED ORDER — METOPROLOL SUCCINATE ER 25 MG PO TB24: 12.5000 mg | ORAL_TABLET | Freq: Every day | ORAL | 0 refills | Status: AC

## 2020-06-04 NOTE — Telephone Encounter (Signed)
Sent in BP medication so she doesn't run out but she will need OV in person over the next 1-2 months to be monitored before further refills will be provided.   Marcy Siren, D.O. Primary Care at [facility]  06/04/2020, 9:05 AM

## 2020-06-04 NOTE — Telephone Encounter (Signed)
Medication Refill. Please advise.

## 2020-06-05 NOTE — Telephone Encounter (Signed)
Called pt to schedule per provider request in person visit before mediations can be sent in.  Pt states will do scheduled virtual visit only due to being immobile. Offered to schedule transportation, pt states she cannot go downstairs to get in vehicle. Pt request provider "send her to a nursing home."

## 2020-06-07 NOTE — Telephone Encounter (Signed)
It is fine to do a virtual visit. You can go ahead and schedule. She will need FL2 form that I can complete if she would like to go to a nursing home.   Marcy Siren, D.O. Primary Care at [facility]  06/07/2020, 2:40 PM

## 2020-06-08 ENCOUNTER — Telehealth: Payer: Self-pay | Admitting: Internal Medicine

## 2020-06-08 NOTE — Telephone Encounter (Signed)
Called pt to schedule virtual. Pt states she has to be out of her apartment by Sunday & will not have wi-fi for phone calls. Pt states she will contact shelters.   Forwarded request to Robyne Peers RN Case Manager / SW for f/u with pt for [facility] for nursing home or any assistance to secure lodging.

## 2020-06-08 NOTE — Telephone Encounter (Signed)
Contacted RN Case management / SW, requested community assistance for lodging.

## 2020-06-11 NOTE — Telephone Encounter (Signed)
Call placed to patient regarding housing concerns. She explained that the landlord of her home is going to be doing renovations and she will need to be out of the house for an indefinite period of time.  She did not have to leave yesterday as expected and she is not sure when she will have to leave but thinks it may be by he end of this month.  She is not sure a shelter will accept her.  She is bed bound at this time and has not been out of bed since 12/29/2019.  She uses pullups and is not able to transfer to a BSC.   She would like her own apartment and would prefer not to go to a SNF.  Her income is about $680/month.  She has granddaughter in this area but does not feel it is appropriate to stay with her.  Provided the patient with the phone number for [facility] and encouraged her to call.  Instructed her to contact this CM for follow up and further questions.

## 2020-06-28 DIAGNOSIS — R3981 Functional urinary incontinence: Secondary | ICD-10-CM | POA: Diagnosis not present

## 2020-06-29 ENCOUNTER — Telehealth: Payer: Medicare HMO | Admitting: Internal Medicine

## 2020-08-01 ENCOUNTER — Telehealth: Payer: Self-pay | Admitting: Internal Medicine

## 2020-08-01 NOTE — Telephone Encounter (Signed)
Marchelle Folks from [facility] [phone] called asking if the Certificate of Medical Necessity has been completed.

## 2020-08-02 NOTE — Telephone Encounter (Signed)
Documents faxed

## 2020-08-22 DIAGNOSIS — I700 Atherosclerosis of aorta: Secondary | ICD-10-CM | POA: Diagnosis not present

## 2020-08-22 DIAGNOSIS — D72829 Elevated white blood cell count, unspecified: Secondary | ICD-10-CM | POA: Diagnosis not present

## 2020-08-22 DIAGNOSIS — I959 Hypotension, unspecified: Secondary | ICD-10-CM | POA: Diagnosis not present

## 2020-08-22 DIAGNOSIS — R110 Nausea: Secondary | ICD-10-CM | POA: Diagnosis not present

## 2020-08-22 DIAGNOSIS — D649 Anemia, unspecified: Secondary | ICD-10-CM | POA: Diagnosis not present

## 2020-08-22 DIAGNOSIS — K5730 Diverticulosis of large intestine without perforation or abscess without bleeding: Secondary | ICD-10-CM | POA: Diagnosis not present

## 2020-08-22 DIAGNOSIS — R519 Headache, unspecified: Secondary | ICD-10-CM | POA: Diagnosis not present

## 2020-08-22 DIAGNOSIS — R1030 Lower abdominal pain, unspecified: Secondary | ICD-10-CM | POA: Diagnosis not present

## 2020-08-22 DIAGNOSIS — I10 Essential (primary) hypertension: Secondary | ICD-10-CM | POA: Diagnosis not present

## 2020-08-22 DIAGNOSIS — R319 Hematuria, unspecified: Secondary | ICD-10-CM | POA: Diagnosis not present

## 2020-08-22 DIAGNOSIS — M419 Scoliosis, unspecified: Secondary | ICD-10-CM | POA: Diagnosis not present

## 2020-08-22 DIAGNOSIS — Z931 Gastrostomy status: Secondary | ICD-10-CM | POA: Diagnosis not present

## 2020-08-23 DIAGNOSIS — I959 Hypotension, unspecified: Secondary | ICD-10-CM | POA: Diagnosis not present

## 2020-08-23 DIAGNOSIS — Z743 Need for continuous supervision: Secondary | ICD-10-CM | POA: Diagnosis not present

## 2020-08-23 DIAGNOSIS — M419 Scoliosis, unspecified: Secondary | ICD-10-CM | POA: Diagnosis not present

## 2020-08-23 DIAGNOSIS — G4489 Other headache syndrome: Secondary | ICD-10-CM | POA: Diagnosis not present

## 2020-08-23 DIAGNOSIS — K5730 Diverticulosis of large intestine without perforation or abscess without bleeding: Secondary | ICD-10-CM | POA: Diagnosis not present

## 2020-08-23 DIAGNOSIS — R110 Nausea: Secondary | ICD-10-CM | POA: Diagnosis not present

## 2020-08-23 DIAGNOSIS — R1084 Generalized abdominal pain: Secondary | ICD-10-CM | POA: Diagnosis not present

## 2020-08-23 DIAGNOSIS — I700 Atherosclerosis of aorta: Secondary | ICD-10-CM | POA: Diagnosis not present

## 2020-08-31 DIAGNOSIS — R001 Bradycardia, unspecified: Secondary | ICD-10-CM | POA: Diagnosis not present

## 2020-08-31 DIAGNOSIS — E785 Hyperlipidemia, unspecified: Secondary | ICD-10-CM | POA: Diagnosis not present

## 2020-08-31 DIAGNOSIS — Z955 Presence of coronary angioplasty implant and graft: Secondary | ICD-10-CM | POA: Diagnosis not present

## 2020-08-31 DIAGNOSIS — R944 Abnormal results of kidney function studies: Secondary | ICD-10-CM | POA: Diagnosis not present

## 2020-08-31 DIAGNOSIS — I444 Left anterior fascicular block: Secondary | ICD-10-CM | POA: Diagnosis not present

## 2020-08-31 DIAGNOSIS — Z743 Need for continuous supervision: Secondary | ICD-10-CM | POA: Diagnosis not present

## 2020-08-31 DIAGNOSIS — U071 COVID-19: Secondary | ICD-10-CM | POA: Diagnosis not present

## 2020-08-31 DIAGNOSIS — E1140 Type 2 diabetes mellitus with diabetic neuropathy, unspecified: Secondary | ICD-10-CM | POA: Diagnosis not present

## 2020-08-31 DIAGNOSIS — N1330 Unspecified hydronephrosis: Secondary | ICD-10-CM | POA: Diagnosis not present

## 2020-08-31 DIAGNOSIS — J449 Chronic obstructive pulmonary disease, unspecified: Secondary | ICD-10-CM | POA: Diagnosis not present

## 2020-08-31 DIAGNOSIS — F17200 Nicotine dependence, unspecified, uncomplicated: Secondary | ICD-10-CM | POA: Diagnosis not present

## 2020-08-31 DIAGNOSIS — F17210 Nicotine dependence, cigarettes, uncomplicated: Secondary | ICD-10-CM | POA: Diagnosis not present

## 2020-08-31 DIAGNOSIS — B9620 Unspecified Escherichia coli [E. coli] as the cause of diseases classified elsewhere: Secondary | ICD-10-CM | POA: Diagnosis not present

## 2020-08-31 DIAGNOSIS — N179 Acute kidney failure, unspecified: Secondary | ICD-10-CM | POA: Diagnosis not present

## 2020-08-31 DIAGNOSIS — R0789 Other chest pain: Secondary | ICD-10-CM | POA: Diagnosis not present

## 2020-08-31 DIAGNOSIS — I25110 Atherosclerotic heart disease of native coronary artery with unstable angina pectoris: Secondary | ICD-10-CM | POA: Diagnosis not present

## 2020-08-31 DIAGNOSIS — N390 Urinary tract infection, site not specified: Secondary | ICD-10-CM | POA: Diagnosis not present

## 2020-08-31 DIAGNOSIS — R748 Abnormal levels of other serum enzymes: Secondary | ICD-10-CM | POA: Diagnosis not present

## 2020-08-31 DIAGNOSIS — E875 Hyperkalemia: Secondary | ICD-10-CM | POA: Diagnosis not present

## 2020-08-31 DIAGNOSIS — Z7409 Other reduced mobility: Secondary | ICD-10-CM | POA: Diagnosis not present

## 2020-08-31 DIAGNOSIS — D631 Anemia in chronic kidney disease: Secondary | ICD-10-CM | POA: Diagnosis not present

## 2020-08-31 DIAGNOSIS — K529 Noninfective gastroenteritis and colitis, unspecified: Secondary | ICD-10-CM | POA: Diagnosis not present

## 2020-08-31 DIAGNOSIS — Z9049 Acquired absence of other specified parts of digestive tract: Secondary | ICD-10-CM | POA: Diagnosis not present

## 2020-08-31 DIAGNOSIS — I2510 Atherosclerotic heart disease of native coronary artery without angina pectoris: Secondary | ICD-10-CM | POA: Diagnosis not present

## 2020-08-31 DIAGNOSIS — J1282 Pneumonia due to coronavirus disease 2019: Secondary | ICD-10-CM | POA: Diagnosis not present

## 2020-08-31 DIAGNOSIS — N1832 Chronic kidney disease, stage 3b: Secondary | ICD-10-CM | POA: Diagnosis not present

## 2020-08-31 DIAGNOSIS — I517 Cardiomegaly: Secondary | ICD-10-CM | POA: Diagnosis not present

## 2020-08-31 DIAGNOSIS — R079 Chest pain, unspecified: Secondary | ICD-10-CM | POA: Diagnosis not present

## 2020-08-31 DIAGNOSIS — E1122 Type 2 diabetes mellitus with diabetic chronic kidney disease: Secondary | ICD-10-CM | POA: Diagnosis not present

## 2020-08-31 DIAGNOSIS — I959 Hypotension, unspecified: Secondary | ICD-10-CM | POA: Diagnosis not present

## 2020-08-31 DIAGNOSIS — L89156 Pressure-induced deep tissue damage of sacral region: Secondary | ICD-10-CM | POA: Diagnosis not present

## 2020-08-31 DIAGNOSIS — E1142 Type 2 diabetes mellitus with diabetic polyneuropathy: Secondary | ICD-10-CM | POA: Diagnosis not present

## 2020-08-31 DIAGNOSIS — N183 Chronic kidney disease, stage 3 unspecified: Secondary | ICD-10-CM | POA: Diagnosis not present

## 2020-08-31 DIAGNOSIS — I5032 Chronic diastolic (congestive) heart failure: Secondary | ICD-10-CM | POA: Diagnosis not present

## 2020-08-31 DIAGNOSIS — I130 Hypertensive heart and chronic kidney disease with heart failure and stage 1 through stage 4 chronic kidney disease, or unspecified chronic kidney disease: Secondary | ICD-10-CM | POA: Diagnosis not present

## 2020-08-31 DIAGNOSIS — N12 Tubulo-interstitial nephritis, not specified as acute or chronic: Secondary | ICD-10-CM | POA: Diagnosis not present

## 2020-08-31 DIAGNOSIS — I4510 Unspecified right bundle-branch block: Secondary | ICD-10-CM | POA: Diagnosis not present

## 2020-08-31 DIAGNOSIS — J441 Chronic obstructive pulmonary disease with (acute) exacerbation: Secondary | ICD-10-CM | POA: Diagnosis not present

## 2020-08-31 DIAGNOSIS — R110 Nausea: Secondary | ICD-10-CM | POA: Diagnosis not present

## 2020-08-31 DIAGNOSIS — K219 Gastro-esophageal reflux disease without esophagitis: Secondary | ICD-10-CM | POA: Diagnosis not present

## 2020-08-31 DIAGNOSIS — I10 Essential (primary) hypertension: Secondary | ICD-10-CM | POA: Diagnosis not present

## 2020-08-31 DIAGNOSIS — E039 Hypothyroidism, unspecified: Secondary | ICD-10-CM | POA: Diagnosis not present

## 2020-08-31 DIAGNOSIS — Z9114 Patient's other noncompliance with medication regimen: Secondary | ICD-10-CM | POA: Diagnosis not present

## 2020-09-01 DIAGNOSIS — N12 Tubulo-interstitial nephritis, not specified as acute or chronic: Secondary | ICD-10-CM | POA: Diagnosis not present

## 2020-09-01 DIAGNOSIS — N390 Urinary tract infection, site not specified: Secondary | ICD-10-CM | POA: Diagnosis not present

## 2020-09-01 DIAGNOSIS — E785 Hyperlipidemia, unspecified: Secondary | ICD-10-CM | POA: Diagnosis not present

## 2020-09-01 DIAGNOSIS — R079 Chest pain, unspecified: Secondary | ICD-10-CM | POA: Diagnosis not present

## 2020-09-01 DIAGNOSIS — U071 COVID-19: Secondary | ICD-10-CM | POA: Diagnosis not present

## 2020-09-01 DIAGNOSIS — E039 Hypothyroidism, unspecified: Secondary | ICD-10-CM | POA: Diagnosis not present

## 2020-09-01 DIAGNOSIS — Z9114 Patient's other noncompliance with medication regimen: Secondary | ICD-10-CM | POA: Diagnosis not present

## 2020-09-01 DIAGNOSIS — R0789 Other chest pain: Secondary | ICD-10-CM | POA: Diagnosis not present

## 2020-09-01 DIAGNOSIS — J441 Chronic obstructive pulmonary disease with (acute) exacerbation: Secondary | ICD-10-CM | POA: Diagnosis not present

## 2020-09-01 DIAGNOSIS — Z955 Presence of coronary angioplasty implant and graft: Secondary | ICD-10-CM | POA: Diagnosis not present

## 2020-09-01 DIAGNOSIS — F17210 Nicotine dependence, cigarettes, uncomplicated: Secondary | ICD-10-CM | POA: Diagnosis not present

## 2020-09-01 DIAGNOSIS — N179 Acute kidney failure, unspecified: Secondary | ICD-10-CM | POA: Diagnosis not present

## 2020-09-01 DIAGNOSIS — I444 Left anterior fascicular block: Secondary | ICD-10-CM | POA: Diagnosis not present

## 2020-09-01 DIAGNOSIS — I517 Cardiomegaly: Secondary | ICD-10-CM | POA: Diagnosis not present

## 2020-09-01 DIAGNOSIS — I2510 Atherosclerotic heart disease of native coronary artery without angina pectoris: Secondary | ICD-10-CM | POA: Diagnosis not present

## 2020-09-02 DIAGNOSIS — U071 COVID-19: Secondary | ICD-10-CM | POA: Diagnosis not present

## 2020-09-02 DIAGNOSIS — E039 Hypothyroidism, unspecified: Secondary | ICD-10-CM | POA: Diagnosis not present

## 2020-09-02 DIAGNOSIS — F17200 Nicotine dependence, unspecified, uncomplicated: Secondary | ICD-10-CM | POA: Diagnosis not present

## 2020-09-02 DIAGNOSIS — N12 Tubulo-interstitial nephritis, not specified as acute or chronic: Secondary | ICD-10-CM | POA: Diagnosis not present

## 2020-09-02 DIAGNOSIS — K219 Gastro-esophageal reflux disease without esophagitis: Secondary | ICD-10-CM | POA: Diagnosis not present

## 2020-09-02 DIAGNOSIS — R748 Abnormal levels of other serum enzymes: Secondary | ICD-10-CM | POA: Diagnosis not present

## 2020-09-02 DIAGNOSIS — J441 Chronic obstructive pulmonary disease with (acute) exacerbation: Secondary | ICD-10-CM | POA: Diagnosis not present

## 2020-09-02 DIAGNOSIS — R0789 Other chest pain: Secondary | ICD-10-CM | POA: Diagnosis not present

## 2020-09-02 DIAGNOSIS — N390 Urinary tract infection, site not specified: Secondary | ICD-10-CM | POA: Diagnosis not present

## 2020-09-03 DIAGNOSIS — R0789 Other chest pain: Secondary | ICD-10-CM | POA: Diagnosis not present

## 2020-09-03 DIAGNOSIS — E039 Hypothyroidism, unspecified: Secondary | ICD-10-CM | POA: Diagnosis not present

## 2020-09-03 DIAGNOSIS — K219 Gastro-esophageal reflux disease without esophagitis: Secondary | ICD-10-CM | POA: Diagnosis not present

## 2020-09-03 DIAGNOSIS — B9620 Unspecified Escherichia coli [E. coli] as the cause of diseases classified elsewhere: Secondary | ICD-10-CM | POA: Diagnosis not present

## 2020-09-03 DIAGNOSIS — U071 COVID-19: Secondary | ICD-10-CM | POA: Diagnosis not present

## 2020-09-03 DIAGNOSIS — J441 Chronic obstructive pulmonary disease with (acute) exacerbation: Secondary | ICD-10-CM | POA: Diagnosis not present

## 2020-09-03 DIAGNOSIS — N390 Urinary tract infection, site not specified: Secondary | ICD-10-CM | POA: Diagnosis not present

## 2020-09-03 DIAGNOSIS — R748 Abnormal levels of other serum enzymes: Secondary | ICD-10-CM | POA: Diagnosis not present

## 2020-09-03 DIAGNOSIS — N12 Tubulo-interstitial nephritis, not specified as acute or chronic: Secondary | ICD-10-CM | POA: Diagnosis not present

## 2020-09-04 DIAGNOSIS — E875 Hyperkalemia: Secondary | ICD-10-CM | POA: Diagnosis not present

## 2020-09-04 DIAGNOSIS — R944 Abnormal results of kidney function studies: Secondary | ICD-10-CM | POA: Diagnosis not present

## 2020-09-04 DIAGNOSIS — N12 Tubulo-interstitial nephritis, not specified as acute or chronic: Secondary | ICD-10-CM | POA: Diagnosis not present

## 2020-09-04 DIAGNOSIS — R0789 Other chest pain: Secondary | ICD-10-CM | POA: Diagnosis not present

## 2020-09-04 DIAGNOSIS — U071 COVID-19: Secondary | ICD-10-CM | POA: Diagnosis not present

## 2020-09-04 DIAGNOSIS — J441 Chronic obstructive pulmonary disease with (acute) exacerbation: Secondary | ICD-10-CM | POA: Diagnosis not present

## 2020-09-04 DIAGNOSIS — N390 Urinary tract infection, site not specified: Secondary | ICD-10-CM | POA: Diagnosis not present

## 2020-09-05 DIAGNOSIS — R0789 Other chest pain: Secondary | ICD-10-CM | POA: Diagnosis not present

## 2020-09-05 DIAGNOSIS — I517 Cardiomegaly: Secondary | ICD-10-CM | POA: Diagnosis not present

## 2020-09-05 DIAGNOSIS — U071 COVID-19: Secondary | ICD-10-CM | POA: Diagnosis not present

## 2020-09-05 DIAGNOSIS — N390 Urinary tract infection, site not specified: Secondary | ICD-10-CM | POA: Diagnosis not present

## 2020-09-05 DIAGNOSIS — E875 Hyperkalemia: Secondary | ICD-10-CM | POA: Diagnosis not present

## 2020-09-05 DIAGNOSIS — I4510 Unspecified right bundle-branch block: Secondary | ICD-10-CM | POA: Diagnosis not present

## 2020-09-05 DIAGNOSIS — J441 Chronic obstructive pulmonary disease with (acute) exacerbation: Secondary | ICD-10-CM | POA: Diagnosis not present

## 2020-09-05 DIAGNOSIS — R944 Abnormal results of kidney function studies: Secondary | ICD-10-CM | POA: Diagnosis not present

## 2020-09-05 DIAGNOSIS — N12 Tubulo-interstitial nephritis, not specified as acute or chronic: Secondary | ICD-10-CM | POA: Diagnosis not present

## 2020-09-06 DIAGNOSIS — J449 Chronic obstructive pulmonary disease, unspecified: Secondary | ICD-10-CM | POA: Diagnosis not present

## 2020-09-06 DIAGNOSIS — N1330 Unspecified hydronephrosis: Secondary | ICD-10-CM | POA: Diagnosis not present

## 2020-09-06 DIAGNOSIS — N1832 Chronic kidney disease, stage 3b: Secondary | ICD-10-CM | POA: Diagnosis not present

## 2020-09-06 DIAGNOSIS — E039 Hypothyroidism, unspecified: Secondary | ICD-10-CM | POA: Diagnosis not present

## 2020-09-06 DIAGNOSIS — E1122 Type 2 diabetes mellitus with diabetic chronic kidney disease: Secondary | ICD-10-CM | POA: Diagnosis not present

## 2020-09-06 DIAGNOSIS — N12 Tubulo-interstitial nephritis, not specified as acute or chronic: Secondary | ICD-10-CM | POA: Diagnosis not present

## 2020-09-06 DIAGNOSIS — J441 Chronic obstructive pulmonary disease with (acute) exacerbation: Secondary | ICD-10-CM | POA: Diagnosis not present

## 2020-09-06 DIAGNOSIS — Z7409 Other reduced mobility: Secondary | ICD-10-CM | POA: Diagnosis not present

## 2020-09-06 DIAGNOSIS — I25110 Atherosclerotic heart disease of native coronary artery with unstable angina pectoris: Secondary | ICD-10-CM | POA: Diagnosis not present

## 2020-09-06 DIAGNOSIS — I2510 Atherosclerotic heart disease of native coronary artery without angina pectoris: Secondary | ICD-10-CM | POA: Diagnosis not present

## 2020-09-06 DIAGNOSIS — K219 Gastro-esophageal reflux disease without esophagitis: Secondary | ICD-10-CM | POA: Diagnosis not present

## 2020-09-06 DIAGNOSIS — R001 Bradycardia, unspecified: Secondary | ICD-10-CM | POA: Diagnosis not present

## 2020-09-06 DIAGNOSIS — U071 COVID-19: Secondary | ICD-10-CM | POA: Diagnosis not present

## 2020-09-06 DIAGNOSIS — E785 Hyperlipidemia, unspecified: Secondary | ICD-10-CM | POA: Diagnosis not present

## 2020-09-06 DIAGNOSIS — E875 Hyperkalemia: Secondary | ICD-10-CM | POA: Diagnosis not present

## 2020-09-06 DIAGNOSIS — N183 Chronic kidney disease, stage 3 unspecified: Secondary | ICD-10-CM | POA: Diagnosis not present

## 2020-09-07 DIAGNOSIS — R001 Bradycardia, unspecified: Secondary | ICD-10-CM | POA: Diagnosis not present

## 2020-09-07 DIAGNOSIS — E785 Hyperlipidemia, unspecified: Secondary | ICD-10-CM | POA: Diagnosis not present

## 2020-09-07 DIAGNOSIS — I2510 Atherosclerotic heart disease of native coronary artery without angina pectoris: Secondary | ICD-10-CM | POA: Diagnosis not present

## 2020-09-07 DIAGNOSIS — E875 Hyperkalemia: Secondary | ICD-10-CM | POA: Diagnosis not present

## 2020-09-07 DIAGNOSIS — E039 Hypothyroidism, unspecified: Secondary | ICD-10-CM | POA: Diagnosis not present

## 2020-09-07 DIAGNOSIS — N183 Chronic kidney disease, stage 3 unspecified: Secondary | ICD-10-CM | POA: Diagnosis not present

## 2020-09-07 DIAGNOSIS — E1122 Type 2 diabetes mellitus with diabetic chronic kidney disease: Secondary | ICD-10-CM | POA: Diagnosis not present

## 2020-09-07 DIAGNOSIS — K219 Gastro-esophageal reflux disease without esophagitis: Secondary | ICD-10-CM | POA: Diagnosis not present

## 2020-09-07 DIAGNOSIS — J441 Chronic obstructive pulmonary disease with (acute) exacerbation: Secondary | ICD-10-CM | POA: Diagnosis not present

## 2020-09-07 DIAGNOSIS — J449 Chronic obstructive pulmonary disease, unspecified: Secondary | ICD-10-CM | POA: Diagnosis not present

## 2020-09-07 DIAGNOSIS — N12 Tubulo-interstitial nephritis, not specified as acute or chronic: Secondary | ICD-10-CM | POA: Diagnosis not present

## 2020-09-07 DIAGNOSIS — U071 COVID-19: Secondary | ICD-10-CM | POA: Diagnosis not present

## 2020-09-08 DIAGNOSIS — R0789 Other chest pain: Secondary | ICD-10-CM | POA: Diagnosis not present

## 2020-09-08 DIAGNOSIS — R001 Bradycardia, unspecified: Secondary | ICD-10-CM | POA: Diagnosis not present

## 2020-09-08 DIAGNOSIS — N12 Tubulo-interstitial nephritis, not specified as acute or chronic: Secondary | ICD-10-CM | POA: Diagnosis not present

## 2020-09-08 DIAGNOSIS — K219 Gastro-esophageal reflux disease without esophagitis: Secondary | ICD-10-CM | POA: Diagnosis not present

## 2020-09-08 DIAGNOSIS — F17200 Nicotine dependence, unspecified, uncomplicated: Secondary | ICD-10-CM | POA: Diagnosis not present

## 2020-09-08 DIAGNOSIS — E785 Hyperlipidemia, unspecified: Secondary | ICD-10-CM | POA: Diagnosis not present

## 2020-09-08 DIAGNOSIS — B9620 Unspecified Escherichia coli [E. coli] as the cause of diseases classified elsewhere: Secondary | ICD-10-CM | POA: Diagnosis not present

## 2020-09-08 DIAGNOSIS — E875 Hyperkalemia: Secondary | ICD-10-CM | POA: Diagnosis not present

## 2020-09-08 DIAGNOSIS — D631 Anemia in chronic kidney disease: Secondary | ICD-10-CM | POA: Diagnosis not present

## 2020-09-08 DIAGNOSIS — N183 Chronic kidney disease, stage 3 unspecified: Secondary | ICD-10-CM | POA: Diagnosis not present

## 2020-09-08 DIAGNOSIS — E039 Hypothyroidism, unspecified: Secondary | ICD-10-CM | POA: Diagnosis not present

## 2020-09-08 DIAGNOSIS — J441 Chronic obstructive pulmonary disease with (acute) exacerbation: Secondary | ICD-10-CM | POA: Diagnosis not present

## 2020-09-08 DIAGNOSIS — E1140 Type 2 diabetes mellitus with diabetic neuropathy, unspecified: Secondary | ICD-10-CM | POA: Diagnosis not present

## 2020-09-08 DIAGNOSIS — U071 COVID-19: Secondary | ICD-10-CM | POA: Diagnosis not present

## 2020-09-09 DIAGNOSIS — J441 Chronic obstructive pulmonary disease with (acute) exacerbation: Secondary | ICD-10-CM | POA: Diagnosis not present

## 2020-09-09 DIAGNOSIS — R079 Chest pain, unspecified: Secondary | ICD-10-CM | POA: Diagnosis not present

## 2020-09-09 DIAGNOSIS — I959 Hypotension, unspecified: Secondary | ICD-10-CM | POA: Diagnosis not present

## 2020-09-09 DIAGNOSIS — N183 Chronic kidney disease, stage 3 unspecified: Secondary | ICD-10-CM | POA: Diagnosis not present

## 2020-09-09 DIAGNOSIS — U071 COVID-19: Secondary | ICD-10-CM | POA: Diagnosis not present

## 2020-09-09 DIAGNOSIS — B9620 Unspecified Escherichia coli [E. coli] as the cause of diseases classified elsewhere: Secondary | ICD-10-CM | POA: Diagnosis not present

## 2020-09-09 DIAGNOSIS — E875 Hyperkalemia: Secondary | ICD-10-CM | POA: Diagnosis not present

## 2020-09-09 DIAGNOSIS — Z743 Need for continuous supervision: Secondary | ICD-10-CM | POA: Diagnosis not present

## 2020-09-09 DIAGNOSIS — N390 Urinary tract infection, site not specified: Secondary | ICD-10-CM | POA: Diagnosis not present

## 2020-09-09 DIAGNOSIS — R0789 Other chest pain: Secondary | ICD-10-CM | POA: Diagnosis not present

## 2020-09-09 DIAGNOSIS — D631 Anemia in chronic kidney disease: Secondary | ICD-10-CM | POA: Diagnosis not present

## 2020-09-09 DIAGNOSIS — N12 Tubulo-interstitial nephritis, not specified as acute or chronic: Secondary | ICD-10-CM | POA: Diagnosis not present

## 2020-09-09 DIAGNOSIS — J1282 Pneumonia due to coronavirus disease 2019: Secondary | ICD-10-CM | POA: Diagnosis not present

## 2020-09-10 ENCOUNTER — Other Ambulatory Visit: Payer: Self-pay

## 2020-09-10 NOTE — Patient Outreach (Signed)
[facility]) Care Management  09/10/2020  Torrie Namba 1956-02-21 [id number]     Transition of Care Referral  Referral Date: 09/10/2020  Referral Source: [facility] Discharge Report Date of Discharge: 09/09/2020 Facility: [facility]    Outreach attempt # 1 to patient. No answer at present. RN CM left HIPAA compliant voicemail along with contact info.    Plan: Assigned RN CM will make outreach attempt to patient within 3-4 business days.   Antionette Fairy, RN,BSN,CCM [facility] Care Management Telephonic Care Management Coordinator Direct Phone: [phone] Toll Free: [phone] Fax: [phone]

## 2020-09-11 NOTE — Progress Notes (Signed)
This encounter was created in error - please disregard.

## 2020-09-12 DIAGNOSIS — I129 Hypertensive chronic kidney disease with stage 1 through stage 4 chronic kidney disease, or unspecified chronic kidney disease: Secondary | ICD-10-CM | POA: Diagnosis not present

## 2020-09-12 DIAGNOSIS — N1832 Chronic kidney disease, stage 3b: Secondary | ICD-10-CM | POA: Diagnosis not present

## 2020-09-12 DIAGNOSIS — E039 Hypothyroidism, unspecified: Secondary | ICD-10-CM | POA: Diagnosis not present

## 2020-09-12 DIAGNOSIS — K219 Gastro-esophageal reflux disease without esophagitis: Secondary | ICD-10-CM | POA: Diagnosis not present

## 2020-09-12 DIAGNOSIS — U071 COVID-19: Secondary | ICD-10-CM | POA: Diagnosis not present

## 2020-09-12 DIAGNOSIS — E1122 Type 2 diabetes mellitus with diabetic chronic kidney disease: Secondary | ICD-10-CM | POA: Diagnosis not present

## 2020-09-12 DIAGNOSIS — J441 Chronic obstructive pulmonary disease with (acute) exacerbation: Secondary | ICD-10-CM | POA: Diagnosis not present

## 2020-09-12 DIAGNOSIS — I2510 Atherosclerotic heart disease of native coronary artery without angina pectoris: Secondary | ICD-10-CM | POA: Diagnosis not present

## 2020-09-12 DIAGNOSIS — J1282 Pneumonia due to coronavirus disease 2019: Secondary | ICD-10-CM | POA: Diagnosis not present

## 2020-09-14 DIAGNOSIS — I2510 Atherosclerotic heart disease of native coronary artery without angina pectoris: Secondary | ICD-10-CM | POA: Diagnosis not present

## 2020-09-14 DIAGNOSIS — E1122 Type 2 diabetes mellitus with diabetic chronic kidney disease: Secondary | ICD-10-CM | POA: Diagnosis not present

## 2020-09-14 DIAGNOSIS — K219 Gastro-esophageal reflux disease without esophagitis: Secondary | ICD-10-CM | POA: Diagnosis not present

## 2020-09-14 DIAGNOSIS — J1282 Pneumonia due to coronavirus disease 2019: Secondary | ICD-10-CM | POA: Diagnosis not present

## 2020-09-14 DIAGNOSIS — E039 Hypothyroidism, unspecified: Secondary | ICD-10-CM | POA: Diagnosis not present

## 2020-09-14 DIAGNOSIS — N1832 Chronic kidney disease, stage 3b: Secondary | ICD-10-CM | POA: Diagnosis not present

## 2020-09-14 DIAGNOSIS — J441 Chronic obstructive pulmonary disease with (acute) exacerbation: Secondary | ICD-10-CM | POA: Diagnosis not present

## 2020-09-14 DIAGNOSIS — U071 COVID-19: Secondary | ICD-10-CM | POA: Diagnosis not present

## 2020-09-14 DIAGNOSIS — I129 Hypertensive chronic kidney disease with stage 1 through stage 4 chronic kidney disease, or unspecified chronic kidney disease: Secondary | ICD-10-CM | POA: Diagnosis not present

## 2020-09-18 DIAGNOSIS — N1832 Chronic kidney disease, stage 3b: Secondary | ICD-10-CM | POA: Diagnosis not present

## 2020-09-18 DIAGNOSIS — E039 Hypothyroidism, unspecified: Secondary | ICD-10-CM | POA: Diagnosis not present

## 2020-09-18 DIAGNOSIS — J441 Chronic obstructive pulmonary disease with (acute) exacerbation: Secondary | ICD-10-CM | POA: Diagnosis not present

## 2020-09-18 DIAGNOSIS — E1122 Type 2 diabetes mellitus with diabetic chronic kidney disease: Secondary | ICD-10-CM | POA: Diagnosis not present

## 2020-09-18 DIAGNOSIS — K219 Gastro-esophageal reflux disease without esophagitis: Secondary | ICD-10-CM | POA: Diagnosis not present

## 2020-09-18 DIAGNOSIS — I2510 Atherosclerotic heart disease of native coronary artery without angina pectoris: Secondary | ICD-10-CM | POA: Diagnosis not present

## 2020-09-18 DIAGNOSIS — I129 Hypertensive chronic kidney disease with stage 1 through stage 4 chronic kidney disease, or unspecified chronic kidney disease: Secondary | ICD-10-CM | POA: Diagnosis not present

## 2020-09-18 DIAGNOSIS — J1282 Pneumonia due to coronavirus disease 2019: Secondary | ICD-10-CM | POA: Diagnosis not present

## 2020-09-18 DIAGNOSIS — U071 COVID-19: Secondary | ICD-10-CM | POA: Diagnosis not present

## 2020-09-20 DIAGNOSIS — E039 Hypothyroidism, unspecified: Secondary | ICD-10-CM | POA: Diagnosis not present

## 2020-09-20 DIAGNOSIS — J1282 Pneumonia due to coronavirus disease 2019: Secondary | ICD-10-CM | POA: Diagnosis not present

## 2020-09-20 DIAGNOSIS — N1832 Chronic kidney disease, stage 3b: Secondary | ICD-10-CM | POA: Diagnosis not present

## 2020-09-20 DIAGNOSIS — I2510 Atherosclerotic heart disease of native coronary artery without angina pectoris: Secondary | ICD-10-CM | POA: Diagnosis not present

## 2020-09-20 DIAGNOSIS — J441 Chronic obstructive pulmonary disease with (acute) exacerbation: Secondary | ICD-10-CM | POA: Diagnosis not present

## 2020-09-20 DIAGNOSIS — U071 COVID-19: Secondary | ICD-10-CM | POA: Diagnosis not present

## 2020-09-20 DIAGNOSIS — I129 Hypertensive chronic kidney disease with stage 1 through stage 4 chronic kidney disease, or unspecified chronic kidney disease: Secondary | ICD-10-CM | POA: Diagnosis not present

## 2020-09-20 DIAGNOSIS — K219 Gastro-esophageal reflux disease without esophagitis: Secondary | ICD-10-CM | POA: Diagnosis not present

## 2020-09-20 DIAGNOSIS — E1122 Type 2 diabetes mellitus with diabetic chronic kidney disease: Secondary | ICD-10-CM | POA: Diagnosis not present

## 2020-09-21 DIAGNOSIS — R110 Nausea: Secondary | ICD-10-CM | POA: Diagnosis not present

## 2020-09-21 DIAGNOSIS — Z0000 Encounter for general adult medical examination without abnormal findings: Secondary | ICD-10-CM | POA: Diagnosis not present

## 2020-09-21 DIAGNOSIS — F32A Depression, unspecified: Secondary | ICD-10-CM | POA: Diagnosis not present

## 2020-09-21 DIAGNOSIS — M797 Fibromyalgia: Secondary | ICD-10-CM | POA: Diagnosis not present

## 2020-09-21 DIAGNOSIS — J449 Chronic obstructive pulmonary disease, unspecified: Secondary | ICD-10-CM | POA: Diagnosis not present

## 2020-09-26 DIAGNOSIS — F32A Depression, unspecified: Secondary | ICD-10-CM | POA: Diagnosis not present

## 2020-09-26 DIAGNOSIS — J449 Chronic obstructive pulmonary disease, unspecified: Secondary | ICD-10-CM | POA: Diagnosis not present

## 2020-09-27 DIAGNOSIS — I129 Hypertensive chronic kidney disease with stage 1 through stage 4 chronic kidney disease, or unspecified chronic kidney disease: Secondary | ICD-10-CM | POA: Diagnosis not present

## 2020-09-27 DIAGNOSIS — E039 Hypothyroidism, unspecified: Secondary | ICD-10-CM | POA: Diagnosis not present

## 2020-09-27 DIAGNOSIS — E1122 Type 2 diabetes mellitus with diabetic chronic kidney disease: Secondary | ICD-10-CM | POA: Diagnosis not present

## 2020-09-27 DIAGNOSIS — N1832 Chronic kidney disease, stage 3b: Secondary | ICD-10-CM | POA: Diagnosis not present

## 2020-09-27 DIAGNOSIS — Z9181 History of falling: Secondary | ICD-10-CM | POA: Diagnosis not present

## 2020-09-27 DIAGNOSIS — J1282 Pneumonia due to coronavirus disease 2019: Secondary | ICD-10-CM | POA: Diagnosis not present

## 2020-09-27 DIAGNOSIS — J441 Chronic obstructive pulmonary disease with (acute) exacerbation: Secondary | ICD-10-CM | POA: Diagnosis not present

## 2020-09-27 DIAGNOSIS — K219 Gastro-esophageal reflux disease without esophagitis: Secondary | ICD-10-CM | POA: Diagnosis not present

## 2020-09-27 DIAGNOSIS — I2510 Atherosclerotic heart disease of native coronary artery without angina pectoris: Secondary | ICD-10-CM | POA: Diagnosis not present

## 2020-09-27 DIAGNOSIS — U071 COVID-19: Secondary | ICD-10-CM | POA: Diagnosis not present

## 2020-10-02 DIAGNOSIS — E1122 Type 2 diabetes mellitus with diabetic chronic kidney disease: Secondary | ICD-10-CM | POA: Diagnosis not present

## 2020-10-02 DIAGNOSIS — J1282 Pneumonia due to coronavirus disease 2019: Secondary | ICD-10-CM | POA: Diagnosis not present

## 2020-10-02 DIAGNOSIS — I2510 Atherosclerotic heart disease of native coronary artery without angina pectoris: Secondary | ICD-10-CM | POA: Diagnosis not present

## 2020-10-02 DIAGNOSIS — U071 COVID-19: Secondary | ICD-10-CM | POA: Diagnosis not present

## 2020-10-02 DIAGNOSIS — K219 Gastro-esophageal reflux disease without esophagitis: Secondary | ICD-10-CM | POA: Diagnosis not present

## 2020-10-02 DIAGNOSIS — I129 Hypertensive chronic kidney disease with stage 1 through stage 4 chronic kidney disease, or unspecified chronic kidney disease: Secondary | ICD-10-CM | POA: Diagnosis not present

## 2020-10-02 DIAGNOSIS — J441 Chronic obstructive pulmonary disease with (acute) exacerbation: Secondary | ICD-10-CM | POA: Diagnosis not present

## 2020-10-02 DIAGNOSIS — E039 Hypothyroidism, unspecified: Secondary | ICD-10-CM | POA: Diagnosis not present

## 2020-10-02 DIAGNOSIS — N1832 Chronic kidney disease, stage 3b: Secondary | ICD-10-CM | POA: Diagnosis not present

## 2020-10-04 DIAGNOSIS — U071 COVID-19: Secondary | ICD-10-CM | POA: Diagnosis not present

## 2020-10-04 DIAGNOSIS — N1832 Chronic kidney disease, stage 3b: Secondary | ICD-10-CM | POA: Diagnosis not present

## 2020-10-04 DIAGNOSIS — J1282 Pneumonia due to coronavirus disease 2019: Secondary | ICD-10-CM | POA: Diagnosis not present

## 2020-10-04 DIAGNOSIS — I129 Hypertensive chronic kidney disease with stage 1 through stage 4 chronic kidney disease, or unspecified chronic kidney disease: Secondary | ICD-10-CM | POA: Diagnosis not present

## 2020-10-04 DIAGNOSIS — R2689 Other abnormalities of gait and mobility: Secondary | ICD-10-CM | POA: Diagnosis not present

## 2020-10-04 DIAGNOSIS — I2510 Atherosclerotic heart disease of native coronary artery without angina pectoris: Secondary | ICD-10-CM | POA: Diagnosis not present

## 2020-10-04 DIAGNOSIS — E1122 Type 2 diabetes mellitus with diabetic chronic kidney disease: Secondary | ICD-10-CM | POA: Diagnosis not present

## 2020-10-04 DIAGNOSIS — K219 Gastro-esophageal reflux disease without esophagitis: Secondary | ICD-10-CM | POA: Diagnosis not present

## 2020-10-04 DIAGNOSIS — E039 Hypothyroidism, unspecified: Secondary | ICD-10-CM | POA: Diagnosis not present

## 2020-10-04 DIAGNOSIS — J441 Chronic obstructive pulmonary disease with (acute) exacerbation: Secondary | ICD-10-CM | POA: Diagnosis not present

## 2020-10-18 DIAGNOSIS — I129 Hypertensive chronic kidney disease with stage 1 through stage 4 chronic kidney disease, or unspecified chronic kidney disease: Secondary | ICD-10-CM | POA: Diagnosis not present

## 2020-10-18 DIAGNOSIS — E039 Hypothyroidism, unspecified: Secondary | ICD-10-CM | POA: Diagnosis not present

## 2020-10-18 DIAGNOSIS — K219 Gastro-esophageal reflux disease without esophagitis: Secondary | ICD-10-CM | POA: Diagnosis not present

## 2020-10-18 DIAGNOSIS — N1832 Chronic kidney disease, stage 3b: Secondary | ICD-10-CM | POA: Diagnosis not present

## 2020-10-18 DIAGNOSIS — J441 Chronic obstructive pulmonary disease with (acute) exacerbation: Secondary | ICD-10-CM | POA: Diagnosis not present

## 2020-10-18 DIAGNOSIS — U071 COVID-19: Secondary | ICD-10-CM | POA: Diagnosis not present

## 2020-10-18 DIAGNOSIS — J1282 Pneumonia due to coronavirus disease 2019: Secondary | ICD-10-CM | POA: Diagnosis not present

## 2020-10-18 DIAGNOSIS — I2510 Atherosclerotic heart disease of native coronary artery without angina pectoris: Secondary | ICD-10-CM | POA: Diagnosis not present

## 2020-10-18 DIAGNOSIS — E1122 Type 2 diabetes mellitus with diabetic chronic kidney disease: Secondary | ICD-10-CM | POA: Diagnosis not present

## 2020-10-19 DIAGNOSIS — I2510 Atherosclerotic heart disease of native coronary artery without angina pectoris: Secondary | ICD-10-CM | POA: Diagnosis not present

## 2020-10-19 DIAGNOSIS — E039 Hypothyroidism, unspecified: Secondary | ICD-10-CM | POA: Diagnosis not present

## 2020-10-19 DIAGNOSIS — J441 Chronic obstructive pulmonary disease with (acute) exacerbation: Secondary | ICD-10-CM | POA: Diagnosis not present

## 2020-10-19 DIAGNOSIS — I129 Hypertensive chronic kidney disease with stage 1 through stage 4 chronic kidney disease, or unspecified chronic kidney disease: Secondary | ICD-10-CM | POA: Diagnosis not present

## 2020-10-19 DIAGNOSIS — K219 Gastro-esophageal reflux disease without esophagitis: Secondary | ICD-10-CM | POA: Diagnosis not present

## 2020-10-19 DIAGNOSIS — N1832 Chronic kidney disease, stage 3b: Secondary | ICD-10-CM | POA: Diagnosis not present

## 2020-10-19 DIAGNOSIS — U071 COVID-19: Secondary | ICD-10-CM | POA: Diagnosis not present

## 2020-10-19 DIAGNOSIS — E1122 Type 2 diabetes mellitus with diabetic chronic kidney disease: Secondary | ICD-10-CM | POA: Diagnosis not present

## 2020-10-19 DIAGNOSIS — J1282 Pneumonia due to coronavirus disease 2019: Secondary | ICD-10-CM | POA: Diagnosis not present

## 2020-10-22 DIAGNOSIS — J449 Chronic obstructive pulmonary disease, unspecified: Secondary | ICD-10-CM | POA: Diagnosis not present

## 2020-10-22 DIAGNOSIS — M797 Fibromyalgia: Secondary | ICD-10-CM | POA: Diagnosis not present

## 2020-10-22 DIAGNOSIS — R2689 Other abnormalities of gait and mobility: Secondary | ICD-10-CM | POA: Diagnosis not present

## 2020-10-22 DIAGNOSIS — I10 Essential (primary) hypertension: Secondary | ICD-10-CM | POA: Diagnosis not present

## 2020-10-22 DIAGNOSIS — F32A Depression, unspecified: Secondary | ICD-10-CM | POA: Diagnosis not present

## 2020-10-23 DIAGNOSIS — E1122 Type 2 diabetes mellitus with diabetic chronic kidney disease: Secondary | ICD-10-CM | POA: Diagnosis not present

## 2020-10-23 DIAGNOSIS — I129 Hypertensive chronic kidney disease with stage 1 through stage 4 chronic kidney disease, or unspecified chronic kidney disease: Secondary | ICD-10-CM | POA: Diagnosis not present

## 2020-10-23 DIAGNOSIS — J441 Chronic obstructive pulmonary disease with (acute) exacerbation: Secondary | ICD-10-CM | POA: Diagnosis not present

## 2020-10-23 DIAGNOSIS — K219 Gastro-esophageal reflux disease without esophagitis: Secondary | ICD-10-CM | POA: Diagnosis not present

## 2020-10-23 DIAGNOSIS — U071 COVID-19: Secondary | ICD-10-CM | POA: Diagnosis not present

## 2020-10-23 DIAGNOSIS — N1832 Chronic kidney disease, stage 3b: Secondary | ICD-10-CM | POA: Diagnosis not present

## 2020-10-23 DIAGNOSIS — J1282 Pneumonia due to coronavirus disease 2019: Secondary | ICD-10-CM | POA: Diagnosis not present

## 2020-10-23 DIAGNOSIS — I2510 Atherosclerotic heart disease of native coronary artery without angina pectoris: Secondary | ICD-10-CM | POA: Diagnosis not present

## 2020-10-23 DIAGNOSIS — E039 Hypothyroidism, unspecified: Secondary | ICD-10-CM | POA: Diagnosis not present

## 2020-10-25 DIAGNOSIS — N1832 Chronic kidney disease, stage 3b: Secondary | ICD-10-CM | POA: Diagnosis not present

## 2020-10-25 DIAGNOSIS — E039 Hypothyroidism, unspecified: Secondary | ICD-10-CM | POA: Diagnosis not present

## 2020-10-25 DIAGNOSIS — J1282 Pneumonia due to coronavirus disease 2019: Secondary | ICD-10-CM | POA: Diagnosis not present

## 2020-10-25 DIAGNOSIS — I129 Hypertensive chronic kidney disease with stage 1 through stage 4 chronic kidney disease, or unspecified chronic kidney disease: Secondary | ICD-10-CM | POA: Diagnosis not present

## 2020-10-25 DIAGNOSIS — J441 Chronic obstructive pulmonary disease with (acute) exacerbation: Secondary | ICD-10-CM | POA: Diagnosis not present

## 2020-10-25 DIAGNOSIS — K219 Gastro-esophageal reflux disease without esophagitis: Secondary | ICD-10-CM | POA: Diagnosis not present

## 2020-10-25 DIAGNOSIS — U071 COVID-19: Secondary | ICD-10-CM | POA: Diagnosis not present

## 2020-10-25 DIAGNOSIS — E1122 Type 2 diabetes mellitus with diabetic chronic kidney disease: Secondary | ICD-10-CM | POA: Diagnosis not present

## 2020-10-25 DIAGNOSIS — I2510 Atherosclerotic heart disease of native coronary artery without angina pectoris: Secondary | ICD-10-CM | POA: Diagnosis not present

## 2020-11-01 DIAGNOSIS — I129 Hypertensive chronic kidney disease with stage 1 through stage 4 chronic kidney disease, or unspecified chronic kidney disease: Secondary | ICD-10-CM | POA: Diagnosis not present

## 2020-11-01 DIAGNOSIS — Z9181 History of falling: Secondary | ICD-10-CM | POA: Diagnosis not present

## 2020-11-01 DIAGNOSIS — J441 Chronic obstructive pulmonary disease with (acute) exacerbation: Secondary | ICD-10-CM | POA: Diagnosis not present

## 2020-11-01 DIAGNOSIS — I2510 Atherosclerotic heart disease of native coronary artery without angina pectoris: Secondary | ICD-10-CM | POA: Diagnosis not present

## 2020-11-08 DIAGNOSIS — K219 Gastro-esophageal reflux disease without esophagitis: Secondary | ICD-10-CM | POA: Diagnosis not present

## 2020-11-08 DIAGNOSIS — I129 Hypertensive chronic kidney disease with stage 1 through stage 4 chronic kidney disease, or unspecified chronic kidney disease: Secondary | ICD-10-CM | POA: Diagnosis not present

## 2020-11-08 DIAGNOSIS — J441 Chronic obstructive pulmonary disease with (acute) exacerbation: Secondary | ICD-10-CM | POA: Diagnosis not present

## 2020-11-08 DIAGNOSIS — E1122 Type 2 diabetes mellitus with diabetic chronic kidney disease: Secondary | ICD-10-CM | POA: Diagnosis not present

## 2020-11-08 DIAGNOSIS — I2510 Atherosclerotic heart disease of native coronary artery without angina pectoris: Secondary | ICD-10-CM | POA: Diagnosis not present

## 2020-11-08 DIAGNOSIS — E039 Hypothyroidism, unspecified: Secondary | ICD-10-CM | POA: Diagnosis not present

## 2020-11-08 DIAGNOSIS — U071 COVID-19: Secondary | ICD-10-CM | POA: Diagnosis not present

## 2020-11-08 DIAGNOSIS — J1282 Pneumonia due to coronavirus disease 2019: Secondary | ICD-10-CM | POA: Diagnosis not present

## 2020-11-08 DIAGNOSIS — N1832 Chronic kidney disease, stage 3b: Secondary | ICD-10-CM | POA: Diagnosis not present

## 2020-11-09 DIAGNOSIS — E039 Hypothyroidism, unspecified: Secondary | ICD-10-CM | POA: Diagnosis not present

## 2020-11-09 DIAGNOSIS — J1282 Pneumonia due to coronavirus disease 2019: Secondary | ICD-10-CM | POA: Diagnosis not present

## 2020-11-09 DIAGNOSIS — I129 Hypertensive chronic kidney disease with stage 1 through stage 4 chronic kidney disease, or unspecified chronic kidney disease: Secondary | ICD-10-CM | POA: Diagnosis not present

## 2020-11-09 DIAGNOSIS — K219 Gastro-esophageal reflux disease without esophagitis: Secondary | ICD-10-CM | POA: Diagnosis not present

## 2020-11-09 DIAGNOSIS — I2510 Atherosclerotic heart disease of native coronary artery without angina pectoris: Secondary | ICD-10-CM | POA: Diagnosis not present

## 2020-11-09 DIAGNOSIS — U071 COVID-19: Secondary | ICD-10-CM | POA: Diagnosis not present

## 2020-11-09 DIAGNOSIS — J441 Chronic obstructive pulmonary disease with (acute) exacerbation: Secondary | ICD-10-CM | POA: Diagnosis not present

## 2020-11-09 DIAGNOSIS — E1122 Type 2 diabetes mellitus with diabetic chronic kidney disease: Secondary | ICD-10-CM | POA: Diagnosis not present

## 2020-11-09 DIAGNOSIS — N1832 Chronic kidney disease, stage 3b: Secondary | ICD-10-CM | POA: Diagnosis not present

## 2020-11-13 DIAGNOSIS — E039 Hypothyroidism, unspecified: Secondary | ICD-10-CM | POA: Diagnosis not present

## 2020-11-13 DIAGNOSIS — F17210 Nicotine dependence, cigarettes, uncomplicated: Secondary | ICD-10-CM | POA: Diagnosis not present

## 2020-11-13 DIAGNOSIS — E1122 Type 2 diabetes mellitus with diabetic chronic kidney disease: Secondary | ICD-10-CM | POA: Diagnosis not present

## 2020-11-13 DIAGNOSIS — I129 Hypertensive chronic kidney disease with stage 1 through stage 4 chronic kidney disease, or unspecified chronic kidney disease: Secondary | ICD-10-CM | POA: Diagnosis not present

## 2020-11-13 DIAGNOSIS — K219 Gastro-esophageal reflux disease without esophagitis: Secondary | ICD-10-CM | POA: Diagnosis not present

## 2020-11-13 DIAGNOSIS — N1832 Chronic kidney disease, stage 3b: Secondary | ICD-10-CM | POA: Diagnosis not present

## 2020-11-13 DIAGNOSIS — I2510 Atherosclerotic heart disease of native coronary artery without angina pectoris: Secondary | ICD-10-CM | POA: Diagnosis not present

## 2020-11-13 DIAGNOSIS — J449 Chronic obstructive pulmonary disease, unspecified: Secondary | ICD-10-CM | POA: Diagnosis not present

## 2020-11-13 DIAGNOSIS — Z7982 Long term (current) use of aspirin: Secondary | ICD-10-CM | POA: Diagnosis not present

## 2020-11-15 DIAGNOSIS — I129 Hypertensive chronic kidney disease with stage 1 through stage 4 chronic kidney disease, or unspecified chronic kidney disease: Secondary | ICD-10-CM | POA: Diagnosis not present

## 2020-11-15 DIAGNOSIS — R29898 Other symptoms and signs involving the musculoskeletal system: Secondary | ICD-10-CM | POA: Diagnosis not present

## 2020-11-15 DIAGNOSIS — E1122 Type 2 diabetes mellitus with diabetic chronic kidney disease: Secondary | ICD-10-CM | POA: Diagnosis not present

## 2020-11-15 DIAGNOSIS — G4489 Other headache syndrome: Secondary | ICD-10-CM | POA: Diagnosis not present

## 2020-11-15 DIAGNOSIS — Z89422 Acquired absence of other left toe(s): Secondary | ICD-10-CM | POA: Diagnosis not present

## 2020-11-15 DIAGNOSIS — Z7982 Long term (current) use of aspirin: Secondary | ICD-10-CM | POA: Diagnosis not present

## 2020-11-15 DIAGNOSIS — N1832 Chronic kidney disease, stage 3b: Secondary | ICD-10-CM | POA: Diagnosis not present

## 2020-11-15 DIAGNOSIS — K219 Gastro-esophageal reflux disease without esophagitis: Secondary | ICD-10-CM | POA: Diagnosis not present

## 2020-11-15 DIAGNOSIS — I10 Essential (primary) hypertension: Secondary | ICD-10-CM | POA: Diagnosis not present

## 2020-11-15 DIAGNOSIS — J449 Chronic obstructive pulmonary disease, unspecified: Secondary | ICD-10-CM | POA: Diagnosis not present

## 2020-11-15 DIAGNOSIS — E039 Hypothyroidism, unspecified: Secondary | ICD-10-CM | POA: Diagnosis not present

## 2020-11-15 DIAGNOSIS — F17210 Nicotine dependence, cigarettes, uncomplicated: Secondary | ICD-10-CM | POA: Diagnosis not present

## 2020-11-15 DIAGNOSIS — K5090 Crohn's disease, unspecified, without complications: Secondary | ICD-10-CM | POA: Diagnosis not present

## 2020-11-15 DIAGNOSIS — R519 Headache, unspecified: Secondary | ICD-10-CM | POA: Diagnosis not present

## 2020-11-15 DIAGNOSIS — I2510 Atherosclerotic heart disease of native coronary artery without angina pectoris: Secondary | ICD-10-CM | POA: Diagnosis not present

## 2020-11-16 DIAGNOSIS — I959 Hypotension, unspecified: Secondary | ICD-10-CM | POA: Diagnosis not present

## 2020-11-16 DIAGNOSIS — R0902 Hypoxemia: Secondary | ICD-10-CM | POA: Diagnosis not present

## 2020-11-16 DIAGNOSIS — R001 Bradycardia, unspecified: Secondary | ICD-10-CM | POA: Diagnosis not present

## 2020-11-16 DIAGNOSIS — Z7401 Bed confinement status: Secondary | ICD-10-CM | POA: Diagnosis not present

## 2020-11-16 DIAGNOSIS — R531 Weakness: Secondary | ICD-10-CM | POA: Diagnosis not present

## 2020-11-21 DIAGNOSIS — Z931 Gastrostomy status: Secondary | ICD-10-CM | POA: Diagnosis not present

## 2020-11-21 DIAGNOSIS — G64 Other disorders of peripheral nervous system: Secondary | ICD-10-CM | POA: Diagnosis not present

## 2020-11-21 DIAGNOSIS — J449 Chronic obstructive pulmonary disease, unspecified: Secondary | ICD-10-CM | POA: Diagnosis not present

## 2020-11-21 DIAGNOSIS — I10 Essential (primary) hypertension: Secondary | ICD-10-CM | POA: Diagnosis not present

## 2020-11-21 DIAGNOSIS — E039 Hypothyroidism, unspecified: Secondary | ICD-10-CM | POA: Diagnosis not present

## 2020-11-21 DIAGNOSIS — G43909 Migraine, unspecified, not intractable, without status migrainosus: Secondary | ICD-10-CM | POA: Diagnosis not present

## 2020-12-01 DIAGNOSIS — R2689 Other abnormalities of gait and mobility: Secondary | ICD-10-CM | POA: Diagnosis not present

## 2020-12-01 DIAGNOSIS — I129 Hypertensive chronic kidney disease with stage 1 through stage 4 chronic kidney disease, or unspecified chronic kidney disease: Secondary | ICD-10-CM | POA: Diagnosis not present

## 2020-12-01 DIAGNOSIS — I2510 Atherosclerotic heart disease of native coronary artery without angina pectoris: Secondary | ICD-10-CM | POA: Diagnosis not present

## 2020-12-01 DIAGNOSIS — J441 Chronic obstructive pulmonary disease with (acute) exacerbation: Secondary | ICD-10-CM | POA: Diagnosis not present

## 2020-12-03 DIAGNOSIS — I2510 Atherosclerotic heart disease of native coronary artery without angina pectoris: Secondary | ICD-10-CM | POA: Diagnosis not present

## 2020-12-03 DIAGNOSIS — E039 Hypothyroidism, unspecified: Secondary | ICD-10-CM | POA: Diagnosis not present

## 2020-12-03 DIAGNOSIS — Z7982 Long term (current) use of aspirin: Secondary | ICD-10-CM | POA: Diagnosis not present

## 2020-12-03 DIAGNOSIS — K219 Gastro-esophageal reflux disease without esophagitis: Secondary | ICD-10-CM | POA: Diagnosis not present

## 2020-12-03 DIAGNOSIS — N1832 Chronic kidney disease, stage 3b: Secondary | ICD-10-CM | POA: Diagnosis not present

## 2020-12-03 DIAGNOSIS — J449 Chronic obstructive pulmonary disease, unspecified: Secondary | ICD-10-CM | POA: Diagnosis not present

## 2020-12-03 DIAGNOSIS — F17210 Nicotine dependence, cigarettes, uncomplicated: Secondary | ICD-10-CM | POA: Diagnosis not present

## 2020-12-03 DIAGNOSIS — I129 Hypertensive chronic kidney disease with stage 1 through stage 4 chronic kidney disease, or unspecified chronic kidney disease: Secondary | ICD-10-CM | POA: Diagnosis not present

## 2020-12-03 DIAGNOSIS — E1122 Type 2 diabetes mellitus with diabetic chronic kidney disease: Secondary | ICD-10-CM | POA: Diagnosis not present

## 2020-12-11 DIAGNOSIS — E039 Hypothyroidism, unspecified: Secondary | ICD-10-CM | POA: Diagnosis not present

## 2020-12-11 DIAGNOSIS — I129 Hypertensive chronic kidney disease with stage 1 through stage 4 chronic kidney disease, or unspecified chronic kidney disease: Secondary | ICD-10-CM | POA: Diagnosis not present

## 2020-12-11 DIAGNOSIS — E1122 Type 2 diabetes mellitus with diabetic chronic kidney disease: Secondary | ICD-10-CM | POA: Diagnosis not present

## 2020-12-11 DIAGNOSIS — F17210 Nicotine dependence, cigarettes, uncomplicated: Secondary | ICD-10-CM | POA: Diagnosis not present

## 2020-12-11 DIAGNOSIS — N1832 Chronic kidney disease, stage 3b: Secondary | ICD-10-CM | POA: Diagnosis not present

## 2020-12-11 DIAGNOSIS — Z7982 Long term (current) use of aspirin: Secondary | ICD-10-CM | POA: Diagnosis not present

## 2020-12-11 DIAGNOSIS — K219 Gastro-esophageal reflux disease without esophagitis: Secondary | ICD-10-CM | POA: Diagnosis not present

## 2020-12-11 DIAGNOSIS — J449 Chronic obstructive pulmonary disease, unspecified: Secondary | ICD-10-CM | POA: Diagnosis not present

## 2020-12-11 DIAGNOSIS — I2510 Atherosclerotic heart disease of native coronary artery without angina pectoris: Secondary | ICD-10-CM | POA: Diagnosis not present

## 2020-12-18 DIAGNOSIS — F17210 Nicotine dependence, cigarettes, uncomplicated: Secondary | ICD-10-CM | POA: Diagnosis not present

## 2020-12-18 DIAGNOSIS — I129 Hypertensive chronic kidney disease with stage 1 through stage 4 chronic kidney disease, or unspecified chronic kidney disease: Secondary | ICD-10-CM | POA: Diagnosis not present

## 2020-12-18 DIAGNOSIS — N1832 Chronic kidney disease, stage 3b: Secondary | ICD-10-CM | POA: Diagnosis not present

## 2020-12-18 DIAGNOSIS — E039 Hypothyroidism, unspecified: Secondary | ICD-10-CM | POA: Diagnosis not present

## 2020-12-18 DIAGNOSIS — Z7982 Long term (current) use of aspirin: Secondary | ICD-10-CM | POA: Diagnosis not present

## 2020-12-18 DIAGNOSIS — E1122 Type 2 diabetes mellitus with diabetic chronic kidney disease: Secondary | ICD-10-CM | POA: Diagnosis not present

## 2020-12-18 DIAGNOSIS — K219 Gastro-esophageal reflux disease without esophagitis: Secondary | ICD-10-CM | POA: Diagnosis not present

## 2020-12-18 DIAGNOSIS — J449 Chronic obstructive pulmonary disease, unspecified: Secondary | ICD-10-CM | POA: Diagnosis not present

## 2020-12-18 DIAGNOSIS — I2510 Atherosclerotic heart disease of native coronary artery without angina pectoris: Secondary | ICD-10-CM | POA: Diagnosis not present

## 2020-12-28 DIAGNOSIS — Z9181 History of falling: Secondary | ICD-10-CM | POA: Diagnosis not present

## 2020-12-28 DIAGNOSIS — J441 Chronic obstructive pulmonary disease with (acute) exacerbation: Secondary | ICD-10-CM | POA: Diagnosis not present

## 2020-12-28 DIAGNOSIS — I129 Hypertensive chronic kidney disease with stage 1 through stage 4 chronic kidney disease, or unspecified chronic kidney disease: Secondary | ICD-10-CM | POA: Diagnosis not present

## 2020-12-28 DIAGNOSIS — I2510 Atherosclerotic heart disease of native coronary artery without angina pectoris: Secondary | ICD-10-CM | POA: Diagnosis not present

## 2021-05-26 IMAGING — XA IR PERC PLACEMENT GASTROSTOMY
4 series · 8 of 8 positions shown · non-contrast
Comparison: none

INDICATION: Dysphagia, malnutrition

[Series 1: fl (-) angio · 1 of 1 slices shown (1 of 4)]
[im 1/1]
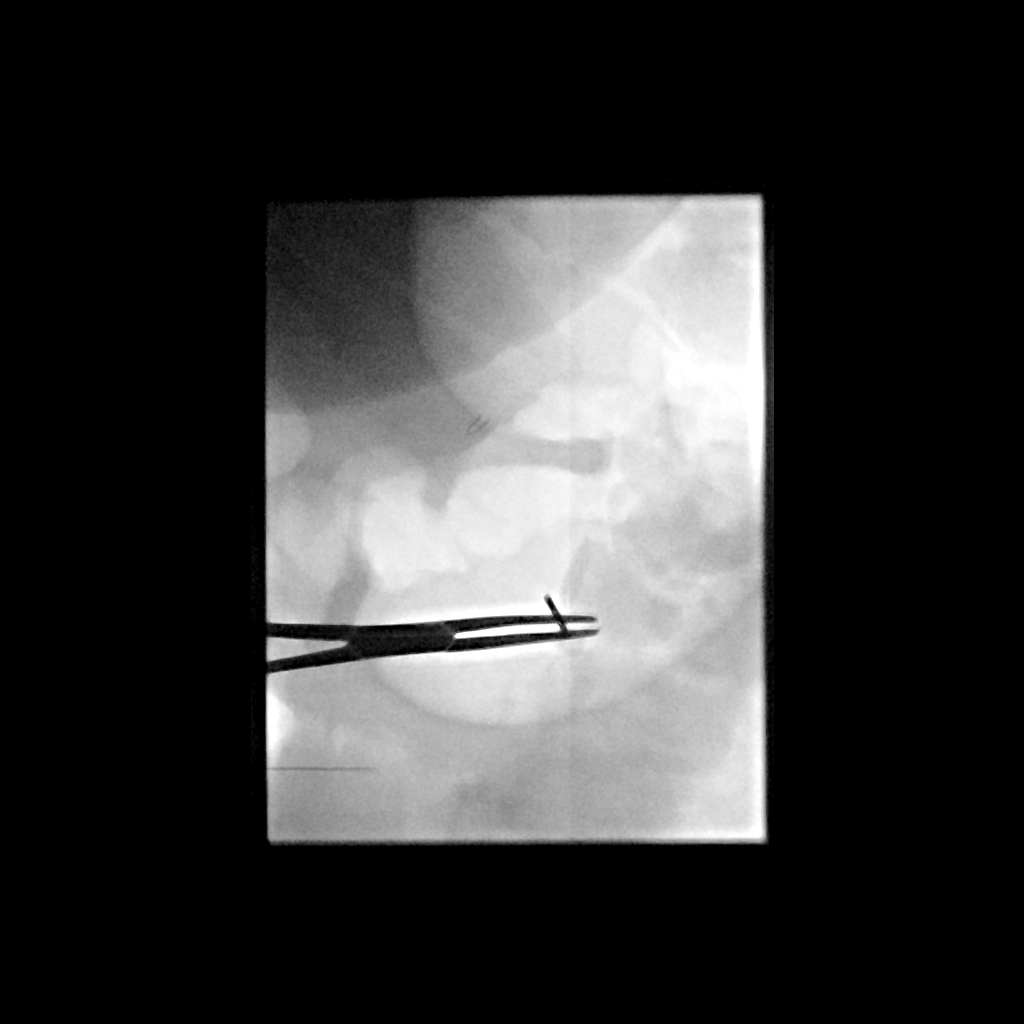

[Series 2: fl (-) angio · 1 of 1 slices shown (2 of 4)]
[im 1/1]
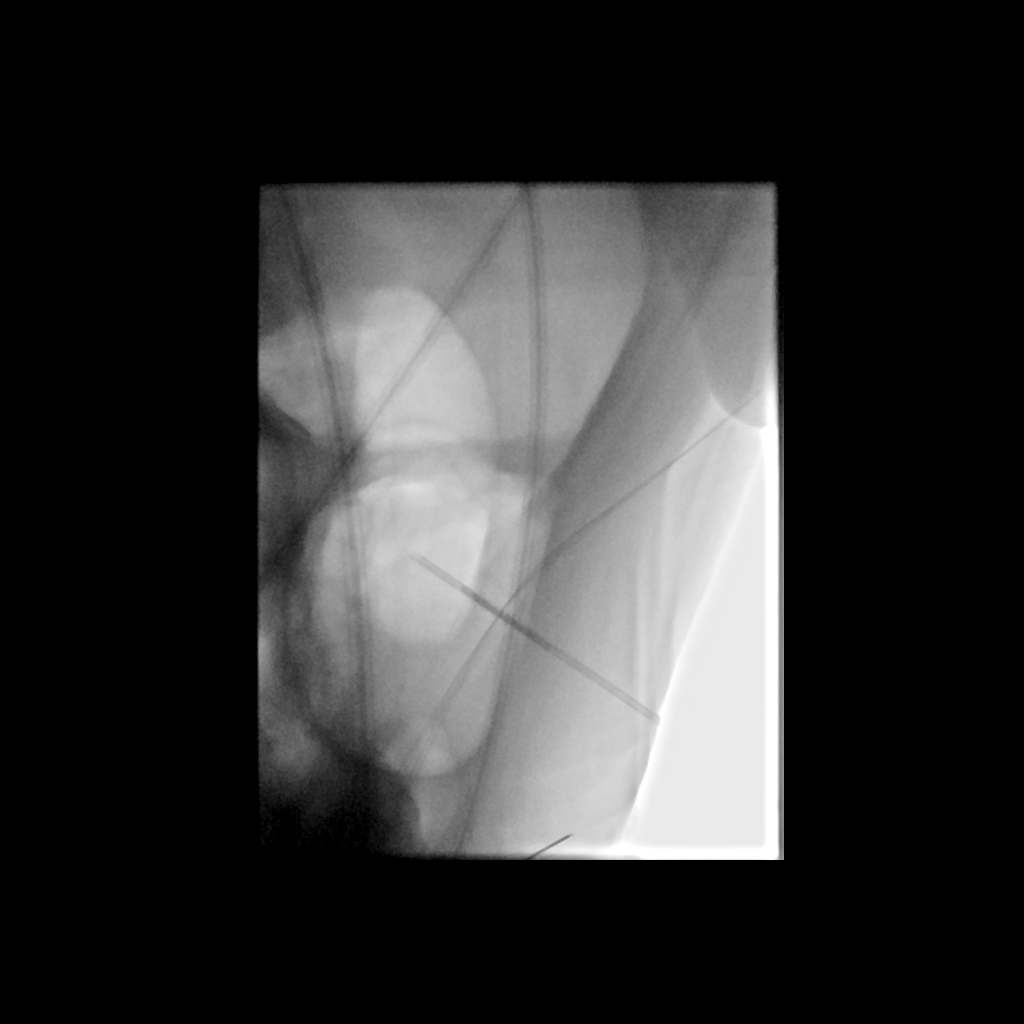

[Series 3: fl (-) angio · 2 acquisitions, 5 frames shown (3 of 4)]
[im 1/2]
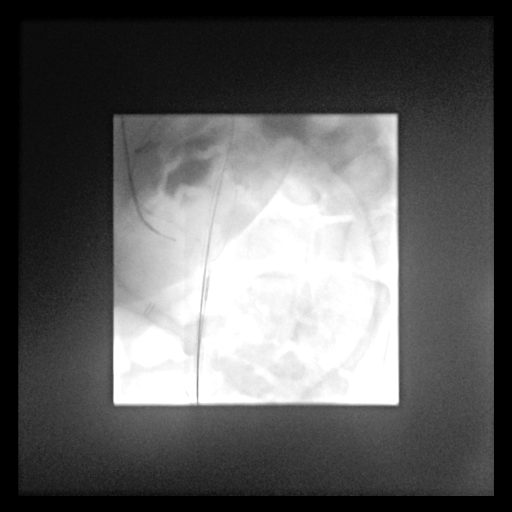
[im 1/2]
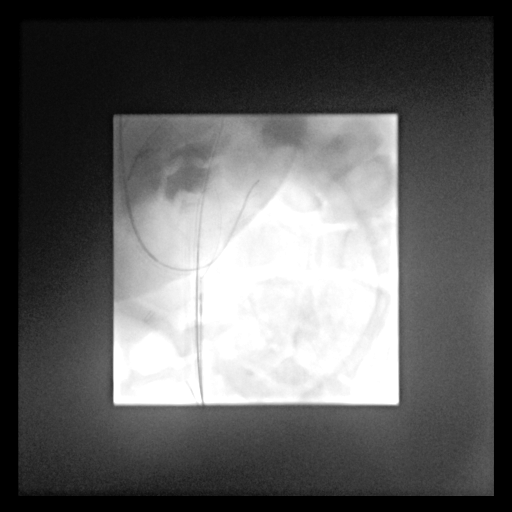
[im 1/2]
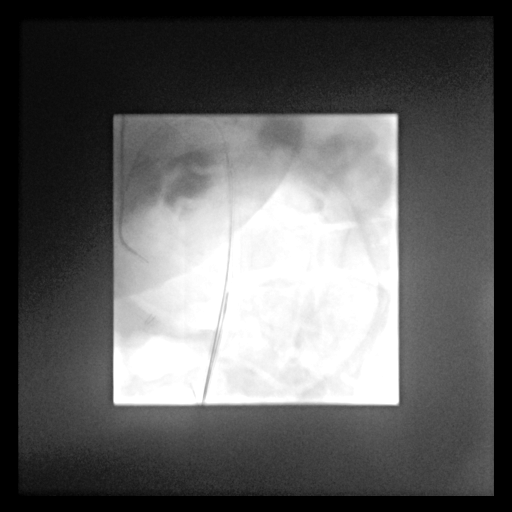
[im 1/2]
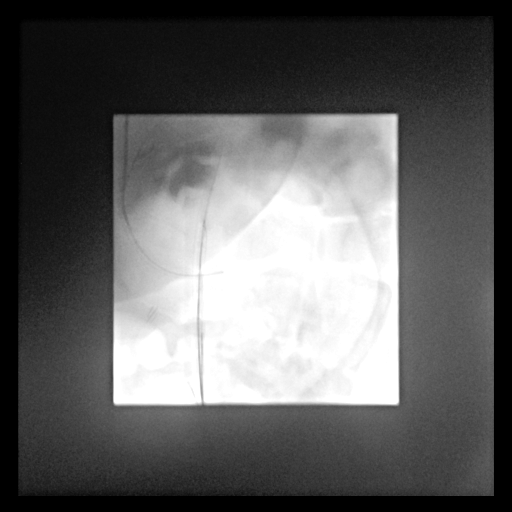
[im 2/2]
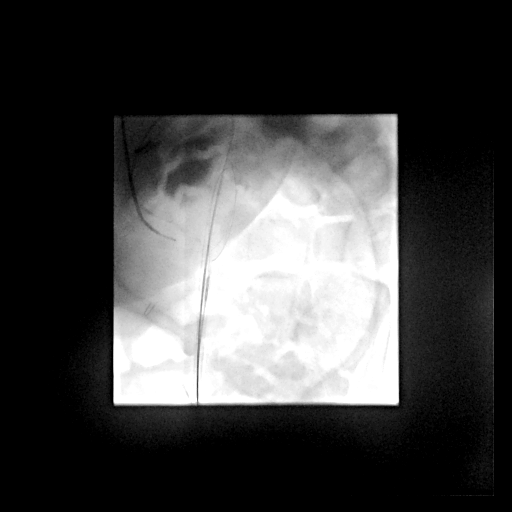

[Series 4: fl (-) angio · 1 of 1 slices shown (4 of 4)]
[im 1/1]
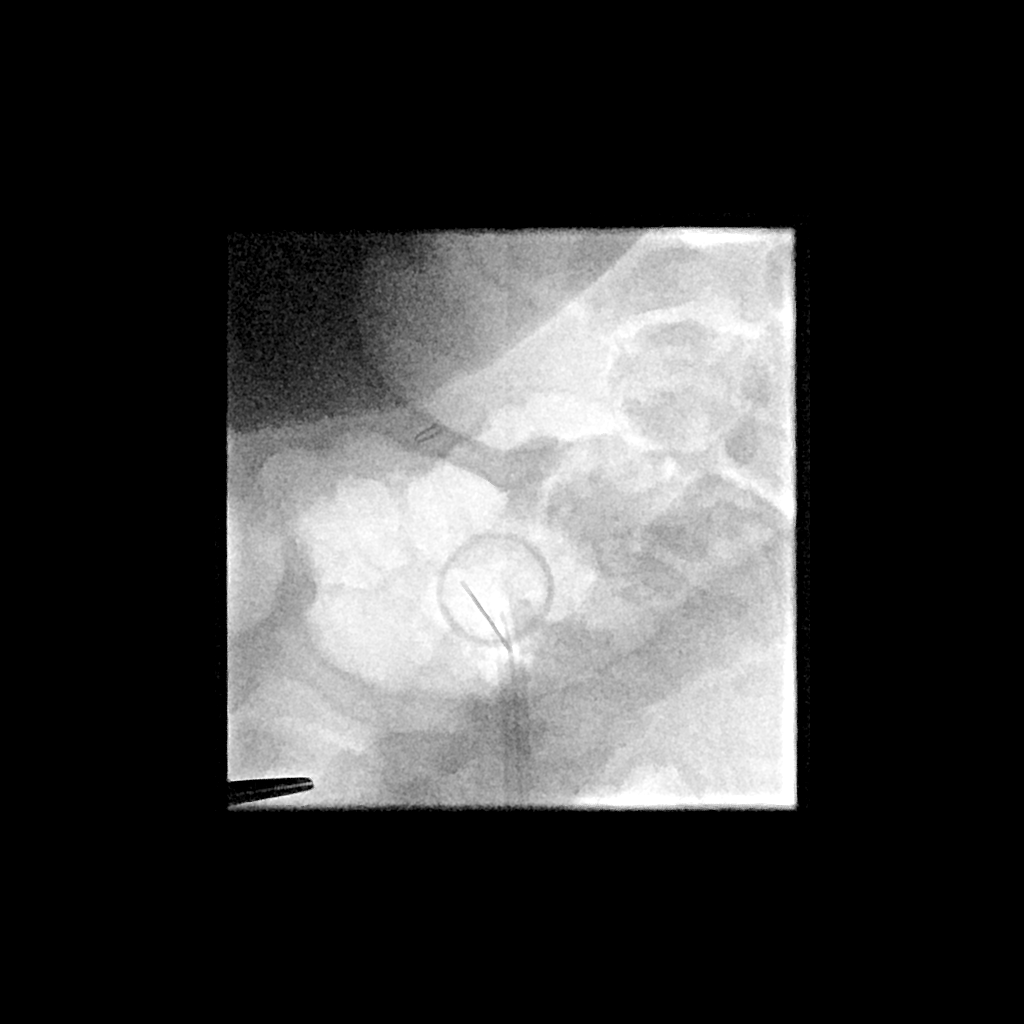

[8 of 8 positions shown; findings below may reference images not displayed]

EXAM:
FLUOROSCOPIC 20 FRENCH PULL-THROUGH GASTROSTOMY

Radiologist:  Tidemand, Geir Arne

Guidance:  Fluoroscopic

MEDICATIONS:
Ancef 2 g; Antibiotics were administered within 1 hour of the
procedure. Glucagon 0.5 mg IV

ANESTHESIA/SEDATION:
Versed 2.0 mg IV; Fentanyl 100 mcg IV

Moderate Sedation Time:  14 minutes

The patient was continuously monitored during the procedure by the
interventional radiology nurse under my direct supervision.

CONTRAST:  10 cc-administered into the gastric lumen.

FLUOROSCOPY TIME:  Fluoroscopy Time: 2 minutes 36 seconds (9 mGy).

COMPLICATIONS:
None immediate.

PROCEDURE:
Informed consent was obtained from the patient following explanation
of the procedure, risks, benefits and alternatives. The patient
understands, agrees and consents for the procedure. All questions
were addressed. A time out was performed.

Maximal barrier sterile technique utilized including caps, mask,
sterile gowns, sterile gloves, large sterile drape, hand hygiene,
and betadine prep.

The left upper quadrant was sterilely prepped and draped. An oral
gastric catheter was inserted into the stomach under fluoroscopy.
The existing nasogastric feeding tube was removed. Air was injected
into the stomach for insufflation and visualization under
fluoroscopy. The air distended stomach was confirmed beneath the
anterior abdominal wall in the frontal and lateral projections.
Under sterile conditions and local anesthesia, a 17 gauge trocar
needle was utilized to access the stomach percutaneously beneath the
left subcostal margin. Needle position was confirmed within the
stomach under biplane fluoroscopy. Contrast injection confirmed
position also. A single T tack was deployed for gastropexy. Over an
Amplatz guide wire, a 9-French sheath was inserted into the stomach.
A snare device was utilized to capture the oral gastric catheter.
The snare device was pulled retrograde from the stomach up the
esophagus and out the oropharynx. The 20-French pull-through
gastrostomy was connected to the snare device and pulled antegrade
through the oropharynx down the esophagus into the stomach and then
through the percutaneous tract external to the patient. The
gastrostomy was assembled externally. Contrast injection confirms
position in the stomach. Images were obtained for documentation. The
patient tolerated procedure well. No immediate complication.
IMPRESSION: Fluoroscopic insertion of a 20-French "pull-through" gastrostomy.

## 2021-05-28 IMAGING — DX DG CHEST 1V PORT
1 series · 1 of 1 positions shown · non-contrast
Comparison: October 21, 2019

CLINICAL DATA: Aspiration and airway

EXAM:
PORTABLE CHEST 1 VIEW

[chest ap]
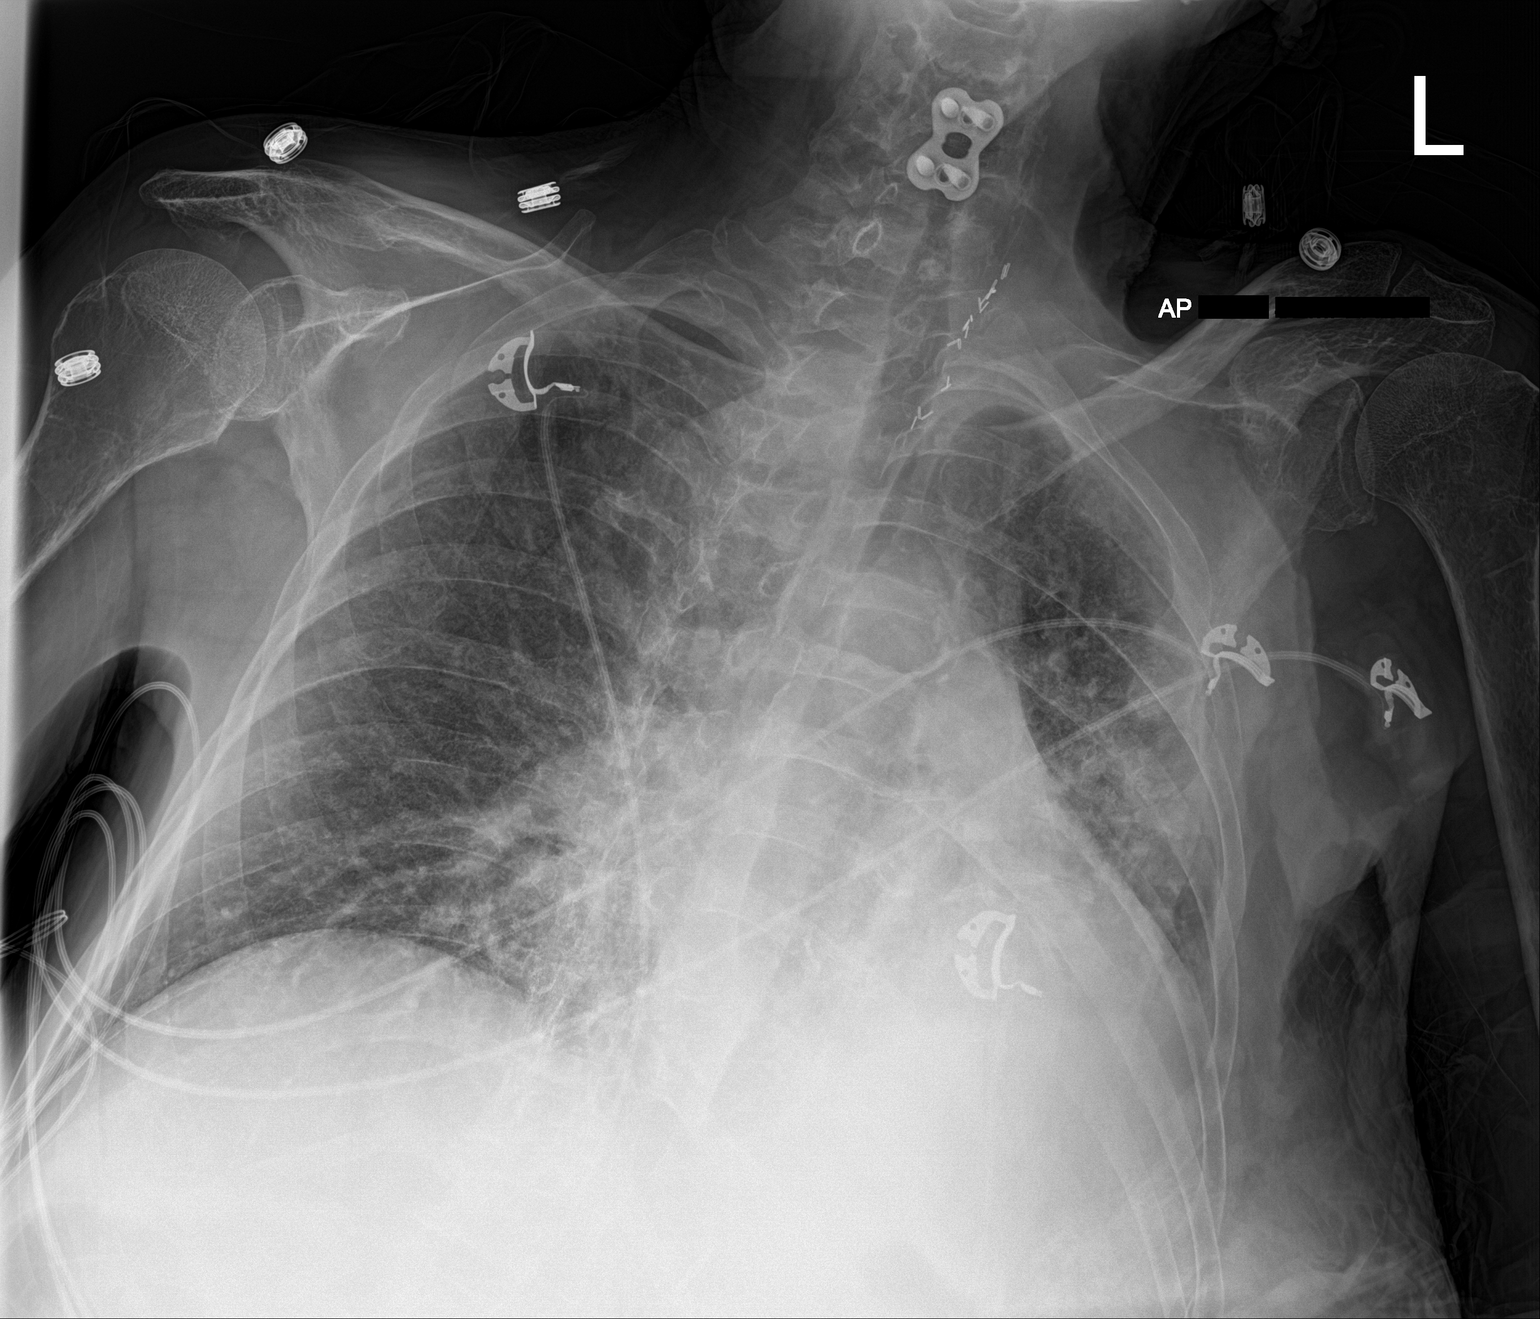

[1 of 1 positions shown; findings below may reference images not displayed]

FINDINGS: The cardiomediastinal silhouette is stable. New infiltrate in the
left mid lung and left retrocardiac region. Mild diffuse
interstitial prominence. No pneumothorax. No other changes.
IMPRESSION: 1. New infiltrate in the left mid lung and left retrocardiac region
may represent aspiration or pneumonia. Recommend clinical
correlation.
2. Minimal interstitial prominence suggests pulmonary venous
congestion.
3. No other acute abnormalities.

## 2022-01-01 DEATH — deceased
